# Patient Record
Sex: Male | Born: 1960
Health system: Southern US, Community
[De-identification: ages and names within clinical notes are randomized; demographics above are authoritative.]

## PROBLEM LIST (undated history)

## (undated) DIAGNOSIS — K565 Intestinal adhesions [bands], unspecified as to partial versus complete obstruction: Secondary | ICD-10-CM

## (undated) DIAGNOSIS — K589 Irritable bowel syndrome without diarrhea: Secondary | ICD-10-CM

## (undated) DIAGNOSIS — G8929 Other chronic pain: Secondary | ICD-10-CM

## (undated) HISTORY — DX: Intestinal adhesions (bands), unspecified as to partial versus complete obstruction: K56.50

## (undated) HISTORY — PX: OTHER SURGICAL HISTORY: SHX169

---

## 1999-07-27 ENCOUNTER — Other Ambulatory Visit: Admission: RE | Admit: 1999-07-27 | Discharge: 1999-07-27 | Payer: Self-pay | Admitting: Orthopedic Surgery

## 2004-10-04 HISTORY — PX: ESOPHAGOGASTRODUODENOSCOPY: SHX1529

## 2004-10-04 HISTORY — PX: COLONOSCOPY: SHX174

## 2005-08-09 ENCOUNTER — Encounter: Admission: RE | Admit: 2005-08-09 | Discharge: 2005-08-09 | Payer: Self-pay | Admitting: Gastroenterology

## 2005-12-13 ENCOUNTER — Encounter: Admission: RE | Admit: 2005-12-13 | Discharge: 2005-12-13 | Payer: Self-pay | Admitting: Gastroenterology

## 2005-12-31 ENCOUNTER — Ambulatory Visit (HOSPITAL_COMMUNITY): Admission: RE | Admit: 2005-12-31 | Discharge: 2005-12-31 | Payer: Self-pay

## 2012-07-13 ENCOUNTER — Other Ambulatory Visit: Payer: Self-pay | Admitting: Family Medicine

## 2012-07-13 DIAGNOSIS — IMO0002 Reserved for concepts with insufficient information to code with codable children: Secondary | ICD-10-CM

## 2012-07-18 ENCOUNTER — Ambulatory Visit (HOSPITAL_COMMUNITY): Payer: BC Managed Care – PPO

## 2013-08-26 ENCOUNTER — Inpatient Hospital Stay (HOSPITAL_COMMUNITY)
Admission: EM | Admit: 2013-08-26 | Discharge: 2013-09-04 | DRG: 336 | Disposition: A | Discharge status: Home / Self Care | Payer: BC Managed Care – PPO | Attending: Family Medicine | Admitting: Family Medicine

## 2013-08-26 ENCOUNTER — Encounter (HOSPITAL_COMMUNITY): Payer: Self-pay | Admitting: Emergency Medicine

## 2013-08-26 ENCOUNTER — Emergency Department (HOSPITAL_COMMUNITY): Payer: BC Managed Care – PPO

## 2013-08-26 DIAGNOSIS — K589 Irritable bowel syndrome without diarrhea: Secondary | ICD-10-CM | POA: Diagnosis present

## 2013-08-26 DIAGNOSIS — K565 Intestinal adhesions [bands], unspecified as to partial versus complete obstruction: Principal | ICD-10-CM | POA: Diagnosis present

## 2013-08-26 DIAGNOSIS — R1084 Generalized abdominal pain: Secondary | ICD-10-CM

## 2013-08-26 DIAGNOSIS — IMO0001 Reserved for inherently not codable concepts without codable children: Secondary | ICD-10-CM

## 2013-08-26 DIAGNOSIS — K5289 Other specified noninfective gastroenteritis and colitis: Secondary | ICD-10-CM

## 2013-08-26 DIAGNOSIS — R933 Abnormal findings on diagnostic imaging of other parts of digestive tract: Secondary | ICD-10-CM

## 2013-08-26 DIAGNOSIS — K529 Noninfective gastroenteritis and colitis, unspecified: Secondary | ICD-10-CM

## 2013-08-26 DIAGNOSIS — R030 Elevated blood-pressure reading, without diagnosis of hypertension: Secondary | ICD-10-CM

## 2013-08-26 DIAGNOSIS — K56609 Unspecified intestinal obstruction, unspecified as to partial versus complete obstruction: Secondary | ICD-10-CM

## 2013-08-26 DIAGNOSIS — R109 Unspecified abdominal pain: Secondary | ICD-10-CM

## 2013-08-26 DIAGNOSIS — E871 Hypo-osmolality and hyponatremia: Secondary | ICD-10-CM | POA: Diagnosis present

## 2013-08-26 DIAGNOSIS — F17200 Nicotine dependence, unspecified, uncomplicated: Secondary | ICD-10-CM | POA: Diagnosis present

## 2013-08-26 DIAGNOSIS — N390 Urinary tract infection, site not specified: Secondary | ICD-10-CM | POA: Diagnosis present

## 2013-08-26 HISTORY — DX: Other chronic pain: G89.29

## 2013-08-26 HISTORY — DX: Irritable bowel syndrome, unspecified: K58.9

## 2013-08-26 LAB — COMPREHENSIVE METABOLIC PANEL
ALT: 24 U/L (ref 0–53)
AST: 23 U/L (ref 0–37)
Alkaline Phosphatase: 97 U/L (ref 39–117)
BUN: 12 mg/dL (ref 6–23)
CO2: 23 mEq/L (ref 19–32)
Chloride: 95 mEq/L — ABNORMAL LOW (ref 96–112)
Creatinine, Ser: 1.04 mg/dL (ref 0.50–1.35)
GFR calc Af Amer: >90 mL/min (ref >90)
GFR calc non Af Amer: 82 mL/min — ABNORMAL LOW (ref >90)
Glucose, Bld: 104 mg/dL — ABNORMAL HIGH (ref 70–99)
Potassium: 3.8 mEq/L (ref 3.5–5.1)
Sodium: 133 mEq/L — ABNORMAL LOW (ref 135–145)
Total Bilirubin: 1.1 mg/dL (ref 0.3–1.2)
Total Protein: 7.6 g/dL (ref 6.0–8.3)

## 2013-08-26 LAB — URINALYSIS, ROUTINE W REFLEX MICROSCOPIC
Glucose, UA: NEGATIVE mg/dL
Leukocytes, UA: NEGATIVE
Protein, ur: 30 mg/dL — AB
Specific Gravity, Urine: 1.01 (ref 1.005–1.030)
pH: 5.5 (ref 5.0–8.0)

## 2013-08-26 LAB — CBC WITH DIFFERENTIAL/PLATELET
Basophils Absolute: 0 10*3/uL (ref 0.0–0.1)
Basophils Relative: 0 % (ref 0–1)
Eosinophils Absolute: 0 10*3/uL (ref 0.0–0.7)
Eosinophils Relative: 0 % (ref 0–5)
Hemoglobin: 14.1 g/dL (ref 13.0–17.0)
Lymphocytes Relative: 17 % (ref 12–46)
Lymphs Abs: 1.2 10*3/uL (ref 0.7–4.0)
MCH: 32.3 pg (ref 26.0–34.0)
MCHC: 36.7 g/dL — ABNORMAL HIGH (ref 30.0–36.0)
MCV: 87.9 fL (ref 78.0–100.0)
Monocytes Absolute: 0.6 10*3/uL (ref 0.1–1.0)
Monocytes Relative: 9 % (ref 3–12)
Neutrophils Relative %: 74 % (ref 43–77)
Platelets: 261 10*3/uL (ref 150–400)
RBC: 4.37 MIL/uL (ref 4.22–5.81)
RDW: 11.6 % (ref 11.5–15.5)
WBC: 7 10*3/uL (ref 4.0–10.5)

## 2013-08-26 LAB — URINE MICROSCOPIC-ADD ON

## 2013-08-26 MED ORDER — HYDROMORPHONE HCL PF 1 MG/ML IJ SOLN
1.0000 mg | Freq: Once | INTRAMUSCULAR | Status: AC
  Administered 2013-08-26: 1 mg via INTRAVENOUS
  Filled 2013-08-26: qty 1

## 2013-08-26 MED ORDER — ONDANSETRON HCL 4 MG/2ML IJ SOLN
4.0000 mg | Freq: Four times a day (QID) | INTRAMUSCULAR | Status: DC | PRN
  Administered 2013-08-26 – 2013-08-29 (×3): 4 mg via INTRAVENOUS
  Filled 2013-08-26 (×3): qty 2

## 2013-08-26 MED ORDER — IOHEXOL 300 MG/ML  SOLN
100.0000 mL | Freq: Once | INTRAMUSCULAR | Status: AC | PRN
  Administered 2013-08-26: 100 mL via INTRAVENOUS

## 2013-08-26 MED ORDER — ONDANSETRON HCL 4 MG PO TABS: 4.0000 mg | ORAL_TABLET | Freq: Four times a day (QID) | ORAL | Status: DC | PRN

## 2013-08-26 MED ORDER — ONDANSETRON HCL 4 MG/2ML IJ SOLN
4.0000 mg | Freq: Once | INTRAMUSCULAR | Status: AC
  Administered 2013-08-26: 4 mg via INTRAVENOUS
  Filled 2013-08-26: qty 2

## 2013-08-26 MED ORDER — IOHEXOL 300 MG/ML  SOLN
25.0000 mL | INTRAMUSCULAR | Status: DC | PRN
  Administered 2013-08-26: 25 mL via ORAL

## 2013-08-26 MED ORDER — HYDROMORPHONE HCL PF 1 MG/ML IJ SOLN
0.5000 mg | INTRAMUSCULAR | Status: DC | PRN
  Administered 2013-08-26 – 2013-09-01 (×25): 0.5 mg via INTRAVENOUS
  Filled 2013-08-26 (×25): qty 1

## 2013-08-26 MED ORDER — METRONIDAZOLE IN NACL 5-0.79 MG/ML-% IV SOLN
500.0000 mg | Freq: Once | INTRAVENOUS | Status: AC
  Administered 2013-08-26: 500 mg via INTRAVENOUS
  Filled 2013-08-26: qty 100

## 2013-08-26 MED ORDER — DEXTROSE-NACL 5-0.45 % IV SOLN
INTRAVENOUS | Status: DC
  Administered 2013-08-26 – 2013-08-27 (×2): via INTRAVENOUS
  Administered 2013-08-27 – 2013-08-28 (×3): 125 mL/h via INTRAVENOUS
  Administered 2013-08-29 – 2013-08-31 (×2): via INTRAVENOUS

## 2013-08-26 MED ORDER — HEPARIN SODIUM (PORCINE) 5000 UNIT/ML IJ SOLN
5000.0000 [IU] | Freq: Three times a day (TID) | INTRAMUSCULAR | Status: DC
  Administered 2013-08-26 – 2013-09-01 (×18): 5000 [IU] via SUBCUTANEOUS
  Filled 2013-08-26 (×23): qty 1

## 2013-08-26 NOTE — ED Provider Notes (Signed)
CT Abdomen Pelvis W Contrast (Final result)  Result time: 08/26/13 16:27:57    Final result by Rad Results In Interface (08/26/13 16:27:57)    Narrative:   CLINICAL DATA: Abdominal pain for 4 days with nausea and vomiting  EXAM: CT ABDOMEN AND PELVIS WITH CONTRAST  TECHNIQUE: Multidetector CT imaging of the abdomen and pelvis was performed using the standard protocol following bolus administration of intravenous contrast.  CONTRAST: OMNIPAQUE IOHEXOL 300 MG/ML SOLN  COMPARISON: 12/13/2005  FINDINGS: Visualized portions of the lung bases are clear.  Liver is normal. Gallbladder is normal. Spleen is normal. Pancreas is normal. Adrenal glands are normal. Kidneys are normal except for tiny subcapsular focus of low attenuation midpole left kidney posteriorly likely not of clinical significance. Aorta is not dilated but the show mild calcification. Bladder and reproductive organs are normal.  The colon is decompressed. There is moderate wall thickening of most of the ileum. Transition and small bowel caliber occurs near the junction of the distal jejunum and ileum in the right lower quadrant on image number 42 series 2. This involves all loop of small bowel showing luminal narrowing which could represent stricture. Proximal to this, there is moderate dilatation of jejunum, to about 3.8 cm. More proximal loops of jejunum are not distended at this time. There is minimal free fluid in the right lower quadrant and into the pelvis.  There are no acute musculoskeletal findings.  IMPRESSION: There is evidence of small bowel obstruction with transition near the junction of jejunum and ileum that appears due to stricturing. Most of the ileum shows moderate wall thickening indicative of inflammation. While this could be infectious, the possibility of inflammatory bowel disease should be considered.   Electronically Signed By: Esperanza Heir M.D. On: 08/26/2013 16:27     Medications  iohexol (OMNIPAQUE) 300 MG/ML solution 25 mL (25 mLs Oral Contrast Given 08/26/13 1432)  metroNIDAZOLE (FLAGYL) IVPB 500 mg (500 mg Intravenous New Bag/Given 08/26/13 1833)  ondansetron (ZOFRAN) injection 4 mg (4 mg Intravenous Given 08/26/13 1421)  HYDROmorphone (DILAUDID) injection 1 mg (1 mg Intravenous Given 08/26/13 1540)  iohexol (OMNIPAQUE) 300 MG/ML solution 100 mL (100 mLs Intravenous Contrast Given 08/26/13 1605)  HYDROmorphone (DILAUDID) injection 1 mg (1 mg Intravenous Given 08/26/13 1819)    Patient care assumed from [facility], PA-C.   Patient case discussed with Dr. Andrey Campanile, general surgery, who felt the patient likely did not have a SBO based on history but will be available for consult for the medicine team.   Pain is well managed in ED. Discussed results of CT scan with patient.   Family Medicine will admit the patient. I have reviewed nursing notes, vital signs, and all appropriate lab and imaging results for this patient. IV antibiotics started. The patient appears reasonably stabilized for admission considering the current resources, flow, and capabilities available in the ED at this time, and I doubt any other [facility] requiring further screening and/or treatment in the ED prior to admission.   Patient d/w with Dr. Fayrene Fearing, agrees with plan.    Jeannetta Ellis, PA-C 08/26/13 2001

## 2013-08-26 NOTE — ED Notes (Signed)
Onset 4-5 days ago LLQ and RLQ worsening today intermittently from 0/10 then 10/10 achy sharp with nausea and diaphoresis in am.

## 2013-08-26 NOTE — ED Provider Notes (Signed)
CSN: [id number]     Arrival date & time 08/26/13  1340 History   First MD Initiated Contact with Patient 08/26/13 1341     Chief Complaint  Patient presents with  . Abdominal Pain   (Consider location/radiation/quality/duration/timing/severity/associated sxs/prior Treatment) HPI Comments: Patient with no past surgical history, history of 'nervous stomach' -- presents with complaint of lower abdominal pain for the past 4 days. Symptoms started with watery diarrhea, vomiting, and intermittent cramping pain. He can feel pain in testicles at times but otherwise does not radiate. Patient states that he has had some mild dysuria and urinary frequency. Incontinent of watery stool and urine at times because he can't make it to the bathroom. No skin changes. Patient was seen at outside urgent care and referred to the emergency department for evaluation of appendicitis/abdominal infection. No treatments PTA. The onset of this condition was acute. The course is constant. Aggravating factors: none. Alleviating factors: none. Patient did have colonoscopy about 5 years ago with polyps but does not remember being told that he had diverticulosis.   The history is provided by the patient.    History reviewed. No pertinent past medical history. History reviewed. No pertinent past surgical history. No family history on file. History  Substance Use Topics  . Smoking status: Not on file  . Smokeless tobacco: Not on file  . Alcohol Use: Not on file    Review of Systems  Constitutional: Negative for fever and chills.  HENT: Negative for rhinorrhea and sore throat.   Eyes: Negative for redness.  Respiratory: Negative for cough.   Cardiovascular: Negative for chest pain.  Gastrointestinal: Positive for nausea, vomiting, abdominal pain and diarrhea.  Genitourinary: Positive for dysuria, frequency and testicular pain. Negative for urgency, hematuria, decreased urine volume and discharge.  Musculoskeletal:  Negative for myalgias.  Skin: Negative for rash.  Neurological: Negative for headaches.    Allergies  Review of patient's allergies indicates no known allergies.  Home Medications   Current Outpatient Rx  Name  Route  Sig  Dispense  Refill  . acetaminophen (TYLENOL) 500 MG tablet   Oral   Take 500 mg by mouth every 6 (six) hours as needed.         Marland Kitchen ibuprofen (ADVIL,MOTRIN) 200 MG tablet   Oral   Take 800 mg by mouth every 6 (six) hours as needed.          BP 133/86  Pulse 73  Temp(Src) 97.3 F (36.3 C) (Oral)  Resp 22  SpO2 99% Physical Exam  Nursing note and vitals reviewed. Constitutional: He appears well-developed and well-nourished.  HENT:  Head: Normocephalic and atraumatic.  Eyes: Conjunctivae are normal. Right eye exhibits no discharge. Left eye exhibits no discharge.  Neck: Normal range of motion. Neck supple.  Cardiovascular: Normal rate, regular rhythm and normal heart sounds.   Pulmonary/Chest: Effort normal and breath sounds normal.  Abdominal: Soft. Bowel sounds are normal. There is tenderness (mild to moderate) in the right lower quadrant, suprapubic area and left lower quadrant. There is no rigidity, no rebound, no guarding, no CVA tenderness, no tenderness at McBurney's point and negative Murphy's sign. No hernia.  Neurological: He is alert.  Skin: Skin is warm and dry.  Psychiatric: He has a normal mood and affect.    ED Course  Procedures (including critical care time) Labs Review Labs Reviewed  CBC WITH DIFFERENTIAL - Abnormal; Notable for the following:    HCT 38.4 (*)    MCHC 36.7 (*)  All other components within normal limits  COMPREHENSIVE METABOLIC PANEL - Abnormal; Notable for the following:    Sodium 133 (*)    Chloride 95 (*)    Glucose, Bld 104 (*)    GFR calc non Af Amer 82 (*)    All other components within normal limits  URINALYSIS, ROUTINE W REFLEX MICROSCOPIC   Imaging Review No results found.  EKG Interpretation    None      1:52 PM Patient seen and examined. Work-up initiated. Medications ordered. Will need CT to r/o appendicitis, diverticulitis.   Vital signs reviewed and are as follows: Filed Vitals:   08/26/13 1340  BP: 133/86  Pulse: 73  Temp: 97.3 F (36.3 C)  Resp: 22   3:32 PM Handoff to Pie[place]brink PA-C who will follow-up on results.   MDM   1. Abdominal pain    Pending eval for abd pain.     Renne Crigler, PA-C 08/26/13 1536

## 2013-08-26 NOTE — ED Notes (Signed)
Pt returns from ct scan. 

## 2013-08-26 NOTE — H&P (Signed)
Family Medicine Teaching [facility] Admission History and Physical Service Pager: [phone]  Patient name: Garrett Lin Medical record number: [id number] Date of birth: Feb 17, 1961 Age: 52 y.o. Gender: male  Primary Care Provider: Pcp Not In System Consultants: None Code Status: Full  Chief Complaint: Abdominal Pain  Assessment and Plan: RENNER SEBALD is a 52 y.o. male presenting with abdominal pain . PMH is significant for IBS.   # SBO - CT shows evidence of small bowel obstruction with transition near the junction of jejunum and ileum that appears due to stricturing. No Hx of abdominal surgery. No hernia present on exam. Possible due to IBD see abdominal pain below.  - NPO; No NG tube currently will consider if Vomiting/nausea/distension occurs - Zofran for nausea - Dilaudid for pain control - IVF: D5 1/2 NS @ 125 - CBC and BMET in am - per ED report, gen surg was consulted and stated nothing to do surgically at this time  # Abdominal Pain - Diffuse abdominal pain greatest in LLQ. Pt is Afebrile and VSS. Denies FHx of IBD. Reports Colonoscopy in his 43s w/ Polyps - DDx: IBS vs IBD vs Cdiff (Abx ~ 87mo ago for sinus infection) vs Gastroenteritis  - Given Flagyl in ED; will hold off on additional abx at this time as patient is without elevated WBC or fever to indicate an infectious process - Pain and Nausea control as above - C diff ordered - CRP: Pending - UA and FOBT ordered - Consider GI consult; may need colonoscopy to evaluate for IBD  # elevated BP: may be related to pain vs patient with HTN - will continue to monitor and consider addition of anti-HTN medication if continues to be elevated  FEN/GI: D5 1/2 NS @ 125 Prophylaxis: Heparin  Disposition: Admit to floor; Attending [place]; Discharge home pending improved PO intake and Pain control  History of Present Illness: Garrett Lin is a 52 y.o. male presenting with diffuse abdominal pain greatest in the  LLQ that began Tuesday that has been getting gradually worse, and associated with N/V/D. Denies current blood in vomit or stool, but hx of rectal bleeding due to hemorrhoids.  Denies any abdominal surgery, but had colonoscopy in his 42 that showed polyps. He endorses a long history of heart burn and diarrhea. Denies any fevers, recent abx use, recent travel, alcohol use, fhx of IBD, or hernias. He notes his sister was recently diagnosed with C diff. He notes he has not seen a regular PCP in some time.  Review Of Systems: Per HPI with the following additions:  Otherwise 12 point review of systems was performed and was unremarkable.  There are no active problems to display for this patient.  Past Medical History: History reviewed. No pertinent past medical history. Past Surgical History: History reviewed. No pertinent past surgical history. Social History: History  Substance Use Topics  . Smoking status: Not on file  . Smokeless tobacco: Not on file  . Alcohol Use: Not on file   Additional social history:   Please also refer to relevant sections of EMR.  Family History: No family history on file. Allergies and Medications: No Known Allergies No current facility-administered medications on file prior to encounter.   No current outpatient prescriptions on file prior to encounter.    Objective: BP 149/87  Pulse 93  Temp(Src) 98.2 F (36.8 C) (Oral)  Resp 20  SpO2 97% Exam: Gen: WD/WN in NAD HEENT: MM dry; PERRLA CV: RRR, No m/r/g; No  LE Edema Lungs: CTAB; Normal Resp. Effort Abdomen: Mild suprapubic tenderness; No rebounding or guarding, non-distended  Labs and Imaging: CBC BMET   Recent Labs Lab 08/26/13 1355  WBC 7.0  HGB 14.1  HCT 38.4*  PLT 261    Recent Labs Lab 08/26/13 1355  NA 133*  K 3.8  CL 95*  CO2 23  BUN 12  CREATININE 1.04  GLUCOSE 104*  CALCIUM 9.4     CT Abdomen IMPRESSION:  There is evidence of small bowel obstruction with transition  near  the junction of jejunum and ileum that appears due to stricturing.  Most of the ileum shows moderate wall thickening indicative of  inflammation. While this could be infectious, the possibility of  inflammatory bowel disease should be considered.   Wenda Low, MD 08/26/2013, 7:32 PM PGY-1, [facility] FPTS Intern pager: [phone], text pages welcome  Upper Level Addendum:  I have seen and evaluated this patient along with Dr. Gayla Doss and reviewed the above note, making necessary revisions in red.   Marikay Alar, MD Family Medicine PGY-2

## 2013-08-27 ENCOUNTER — Encounter (HOSPITAL_COMMUNITY): Payer: Self-pay | Admitting: *Deleted

## 2013-08-27 DIAGNOSIS — R1084 Generalized abdominal pain: Secondary | ICD-10-CM

## 2013-08-27 DIAGNOSIS — N390 Urinary tract infection, site not specified: Secondary | ICD-10-CM

## 2013-08-27 DIAGNOSIS — K56609 Unspecified intestinal obstruction, unspecified as to partial versus complete obstruction: Secondary | ICD-10-CM

## 2013-08-27 DIAGNOSIS — R933 Abnormal findings on diagnostic imaging of other parts of digestive tract: Secondary | ICD-10-CM

## 2013-08-27 LAB — CBC
HCT: 45.1 % (ref 39.0–52.0)
Hemoglobin: 16.4 g/dL (ref 13.0–17.0)
MCV: 90.4 fL (ref 78.0–100.0)
Platelets: 272 10*3/uL (ref 150–400)
RBC: 4.99 MIL/uL (ref 4.22–5.81)
RDW: 11.8 % (ref 11.5–15.5)
WBC: 6.3 10*3/uL (ref 4.0–10.5)

## 2013-08-27 LAB — BASIC METABOLIC PANEL
CO2: 27 mEq/L (ref 19–32)
Calcium: 8.9 mg/dL (ref 8.4–10.5)
Chloride: 97 mEq/L (ref 96–112)
Creatinine, Ser: 1.09 mg/dL (ref 0.50–1.35)
Potassium: 3.6 mEq/L (ref 3.5–5.1)
Sodium: 134 mEq/L — ABNORMAL LOW (ref 135–145)

## 2013-08-27 LAB — SEDIMENTATION RATE: Sed Rate: 5 mm/hr (ref 0–16)

## 2013-08-27 LAB — C-REACTIVE PROTEIN: CRP: 2.7 mg/dL — ABNORMAL HIGH (ref <0.60)

## 2013-08-27 MED ORDER — CIPROFLOXACIN IN D5W 200 MG/100ML IV SOLN
200.0000 mg | Freq: Two times a day (BID) | INTRAVENOUS | Status: DC
  Administered 2013-08-27: 200 mg via INTRAVENOUS
  Filled 2013-08-27 (×2): qty 100

## 2013-08-27 MED ORDER — PANTOPRAZOLE SODIUM 40 MG IV SOLR
40.0000 mg | INTRAVENOUS | Status: DC
  Administered 2013-08-27 – 2013-08-28 (×2): 40 mg via INTRAVENOUS
  Filled 2013-08-27 (×3): qty 40

## 2013-08-27 MED ORDER — CIPROFLOXACIN IN D5W 400 MG/200ML IV SOLN
400.0000 mg | Freq: Two times a day (BID) | INTRAVENOUS | Status: DC
  Administered 2013-08-27 – 2013-08-29 (×4): 400 mg via INTRAVENOUS
  Filled 2013-08-27 (×5): qty 200

## 2013-08-27 NOTE — Progress Notes (Signed)
Family Medicine Teaching Service Daily Progress Note Intern Pager: [phone]  Patient name: Garrett Lin Medical record number: [id number] Date of birth: 1960-12-29 Age: 52 y.o. Gender: male  Primary Care Provider: Pcp Not In System Consultants: None Code Status: Full  Pt Overview and Major Events to Date:  11/23: CT concerning for SBO and ileitis; VSS; NPO;  D5 1/2 NS @ 125   Assessment and Plan: Garrett Lin is a 52 y.o. male presenting with abdominal pain . PMH is significant for IBS.   # SBO  - CT shows evidence of small bowel obstruction with transition near the junction of jejunum and ileum that appears due to stricturing. No Hx of abdominal surgery. No hernia present on exam. Possible due to IBD see abdominal pain below.  - per ED report, gen surg was consulted and stated nothing to do surgically at this time  - NPO; No NG tube currently will consider if Vomiting/nausea/distension occurs  - Zofran for nausea  - Dilaudid for pain control  - IVF: D5 1/2 NS @ 125  - CBC wnl & BMET (11/24): Na 134, Gluc 111, GFR 77 < 88 (on admit)  # Abdominal Pain  - Diffuse abdominal pain greatest in LLQ. Pt is Afebrile and VSS. Denies FHx of IBD. Reports Colonoscopy in his 83s w/ Polyps  - DDx: IBS vs IBD vs Cdiff (Abx ~ 15mo ago for sinus infection) vs Gastroenteritis  - Given Flagyl in ED; will hold off on additional abx at this time as patient is without elevated WBC or fever to indicate an infectious process  - Pain and Nausea control as above  - C diff & FOBT: ordered  - CRP: Pending  - Consider GI consult; may need colonoscopy to evaluate for IBD   # UTI - UA: Positive for Nitrites, Leuks (-), Small Hgb, Bili, and rare Bacteria - Afeb, No CVA tenderness - Cipro 400 BID:  day 1; to cover possible IBD  # elevated BP: may be related to pain vs patient with HTN  - BP last 24hrs: (131-160)/(77-90) 140/77 mmHg  - will continue to monitor and consider addition of anti-HTN medication  if continues to be elevated   FEN/GI: D5 1/2 NS @ 125  Prophylaxis: Heparin   Disposition: Attending [place]; Discharge home pending improved PO intake and Pain control  Subjective: Still endorses "heart burn" with some nausea, but no vomiting since admission. Wants to hold off on NG tube at this time. No BM or passing gas since admission.   Objective: Temp:  [97.3 F (36.3 C)-98.2 F (36.8 C)] 97.7 F (36.5 C) (11/24 0549) Pulse Rate:  [73-94] 74 (11/24 0549) Resp:  [16-22] 20 (11/24 0549) BP: (131-160)/(77-90) 140/77 mmHg (11/24 0549) SpO2:  [94 %-99 %] 98 % (11/24 0549) Weight:  [149 lb 11.2 oz (67.903 kg)] 149 lb 11.2 oz (67.903 kg) (11/23 2009) Physical Exam: Gen: WD/WN in NAD  HEENT: MMM; PERRLA  CV: RRR, No m/r/g; No LE Edema  Lungs: CTAB; Normal Resp. Effort  Abdomen: Mild suprapubic tenderness; No rebounding or guarding, non-distended  Laboratory:  Recent Labs Lab 08/26/13 1355 08/27/13 0315  WBC 7.0 6.3  HGB 14.1 16.4  HCT 38.4* 45.1  PLT 261 272    Recent Labs Lab 08/26/13 1355 08/27/13 0315  NA 133* 134*  K 3.8 3.6  CL 95* 97  CO2 23 27  BUN 12 15  CREATININE 1.04 1.09  CALCIUM 9.4 8.9  PROT 7.6  --   BILITOT  1.1  --   ALKPHOS 97  --   ALT 24  --   AST 23  --   GLUCOSE 104* 111*   Imaging/Diagnostic Tests: CT Abdomen  IMPRESSION:  There is evidence of small bowel obstruction with transition near  the junction of jejunum and ileum that appears due to stricturing.  Most of the ileum shows moderate wall thickening indicative of  inflammation. While this could be infectious, the possibility of  inflammatory bowel disease should be considered.  Wenda Low, MD 08/27/2013, 6:34 AM PGY-1, [place] FPTS Intern pager: [phone], text pages welcome

## 2013-08-27 NOTE — Consult Note (Signed)
Reason for Consult:CT scan finding of "partial small bowel obstruction" Referring Physician: Dr. Randolm Idol - FPTS  MASAKI ROTHBAUER is an 52 y.o. male.  HPI: This is a 52 yo male with a history of irritable bowel syndrome and "nervous stomach" that presents with one week of generalized abdominal distention, diarrhea, and heartburn.  Denies any hematochezia, melena.  He began vomiting over the weekend.  His last large BM was 08/26/13 and was watery diarrhea.  The pain worsened, so he came to the ED on 11/23 and was admitted by FPTS.  CT scan 11/23 showed thickening of the ileum with possible PSBO.  GI - Russella Dar - has seen the patient.  No follow-up films have been performed yet.  The patient reports a small bowel movement and flatus today.  His abdomen feels distended.  Past Medical History  Diagnosis Date  . Irritable bowel syndrome (IBS) 2006, 2007    "nervous stomach" abdominal pain  . Chronic pain     right forearm.    Past Surgical History  Procedure Laterality Date  . Colonoscopy  2006    Carman Ching MD  . Esophagogastroduodenoscopy  2006    Raymondo Band MD  . Surgery for right forarm trauma      work related machine injury    Family History  Problem Relation Age of Onset  .       Social History:  reports that he has been smoking Cigarettes.  He has a 15 pack-year smoking history. He does not have any smokeless tobacco history on file. His alcohol and drug histories are not on file.  Allergies: No Known Allergies  Medications:  Prior to Admission medications   Medication Sig Start Date End Date Taking? Authorizing Provider  acetaminophen (TYLENOL) 500 MG tablet Take 500 mg by mouth every 6 (six) hours as needed.   Yes Historical Provider, MD  ibuprofen (ADVIL,MOTRIN) 200 MG tablet Take 800 mg by mouth every 6 (six) hours as needed.   Yes Historical Provider, MD     Results for orders placed during the hospital encounter of 08/26/13 (from the past 48 hour(s))  CBC WITH  DIFFERENTIAL     Status: Abnormal   Collection Time    08/26/13  1:55 PM      Result Value Range   WBC 7.0  4.0 - 10.5 K/uL   RBC 4.37  4.22 - 5.81 MIL/uL   Hemoglobin 14.1  13.0 - 17.0 g/dL   HCT 16.1 (*) 09.6 - 04.5 %   MCV 87.9  78.0 - 100.0 fL   MCH 32.3  26.0 - 34.0 pg   MCHC 36.7 (*) 30.0 - 36.0 g/dL   RDW 40.9  81.1 - 91.4 %   Platelets 261  150 - 400 K/uL   Neutrophils Relative % 74  43 - 77 %   Neutro Abs 5.2  1.7 - 7.7 K/uL   Lymphocytes Relative 17  12 - 46 %   Lymphs Abs 1.2  0.7 - 4.0 K/uL   Monocytes Relative 9  3 - 12 %   Monocytes Absolute 0.6  0.1 - 1.0 K/uL   Eosinophils Relative 0  0 - 5 %   Eosinophils Absolute 0.0  0.0 - 0.7 K/uL   Basophils Relative 0  0 - 1 %   Basophils Absolute 0.0  0.0 - 0.1 K/uL  COMPREHENSIVE METABOLIC PANEL     Status: Abnormal   Collection Time    08/26/13  1:55 PM  Result Value Range   Sodium 133 (*) 135 - 145 mEq/L   Potassium 3.8  3.5 - 5.1 mEq/L   Chloride 95 (*) 96 - 112 mEq/L   CO2 23  19 - 32 mEq/L   Glucose, Bld 104 (*) 70 - 99 mg/dL   BUN 12  6 - 23 mg/dL   Creatinine, Ser 0.27  0.50 - 1.35 mg/dL   Calcium 9.4  8.4 - 25.3 mg/dL   Total Protein 7.6  6.0 - 8.3 g/dL   Albumin 4.2  3.5 - 5.2 g/dL   AST 23  0 - 37 U/L   Comment: HEMOLYSIS AT THIS LEVEL MAY AFFECT RESULT   ALT 24  0 - 53 U/L   Alkaline Phosphatase 97  39 - 117 U/L   Total Bilirubin 1.1  0.3 - 1.2 mg/dL   GFR calc non Af Amer 82 (*) >90 mL/min   GFR calc Af Amer >90  >90 mL/min   Comment: (NOTE)     The eGFR has been calculated using the CKD EPI equation.     This calculation has not been validated in all clinical situations.     eGFR's persistently <90 mL/min signify possible Chronic Kidney     Disease.  URINALYSIS, ROUTINE W REFLEX MICROSCOPIC     Status: Abnormal   Collection Time    08/26/13 10:00 PM      Result Value Range   Color, Urine AMBER (*) YELLOW   Comment: BIOCHEMICALS MAY BE AFFECTED BY COLOR   APPearance CLEAR  CLEAR    Specific Gravity, Urine 1.010  1.005 - 1.030   pH 5.5  5.0 - 8.0   Glucose, UA NEGATIVE  NEGATIVE mg/dL   Hgb urine dipstick SMALL (*) NEGATIVE   Bilirubin Urine SMALL (*) NEGATIVE   Ketones, ur 40 (*) NEGATIVE mg/dL   Protein, ur 30 (*) NEGATIVE mg/dL   Urobilinogen, UA 1.0  0.0 - 1.0 mg/dL   Nitrite POSITIVE (*) NEGATIVE   Leukocytes, UA NEGATIVE  NEGATIVE  URINE MICROSCOPIC-ADD ON     Status: None   Collection Time    08/26/13 10:00 PM      Result Value Range   Squamous Epithelial / LPF RARE  RARE   RBC / HPF 3-6  <3 RBC/hpf   Bacteria, UA RARE  RARE  BASIC METABOLIC PANEL     Status: Abnormal   Collection Time    08/27/13  3:15 AM      Result Value Range   Sodium 134 (*) 135 - 145 mEq/L   Potassium 3.6  3.5 - 5.1 mEq/L   Chloride 97  96 - 112 mEq/L   CO2 27  19 - 32 mEq/L   Glucose, Bld 111 (*) 70 - 99 mg/dL   BUN 15  6 - 23 mg/dL   Creatinine, Ser 6.64  0.50 - 1.35 mg/dL   Calcium 8.9  8.4 - 40.3 mg/dL   GFR calc non Af Amer 77 (*) >90 mL/min   GFR calc Af Amer 90 (*) >90 mL/min   Comment: (NOTE)     The eGFR has been calculated using the CKD EPI equation.     This calculation has not been validated in all clinical situations.     eGFR's persistently <90 mL/min signify possible Chronic Kidney     Disease.  CBC     Status: Abnormal   Collection Time    08/27/13  3:15 AM      Result Value Range  WBC 6.3  4.0 - 10.5 K/uL   RBC 4.99  4.22 - 5.81 MIL/uL   Hemoglobin 16.4  13.0 - 17.0 g/dL   HCT 11.9  14.7 - 82.9 %   MCV 90.4  78.0 - 100.0 fL   MCH 32.9  26.0 - 34.0 pg   MCHC 36.4 (*) 30.0 - 36.0 g/dL   RDW 56.2  13.0 - 86.5 %   Platelets 272  150 - 400 K/uL  C-REACTIVE PROTEIN     Status: Abnormal   Collection Time    08/27/13  3:15 AM      Result Value Range   CRP 2.7 (*) <0.60 mg/dL   Comment: Performed at [facility]  SEDIMENTATION RATE     Status: None   Collection Time    08/27/13  2:00 PM      Result Value Range   Sed Rate 5  0 - 16 mm/hr     Ct Abdomen Pelvis W Contrast  08/26/2013   CLINICAL DATA:  Abdominal pain for 4 days with nausea and vomiting  EXAM: CT ABDOMEN AND PELVIS WITH CONTRAST  TECHNIQUE: Multidetector CT imaging of the abdomen and pelvis was performed using the standard protocol following bolus administration of intravenous contrast.  CONTRAST:  OMNIPAQUE IOHEXOL 300 MG/ML  SOLN  COMPARISON:  12/13/2005  FINDINGS: Visualized portions of the lung bases are clear.  Liver is normal. Gallbladder is normal. Spleen is normal. Pancreas is normal. Adrenal glands are normal. Kidneys are normal except for tiny subcapsular focus of low attenuation midpole left kidney posteriorly likely not of clinical significance. Aorta is not dilated but the show mild calcification. Bladder and reproductive organs are normal.  The colon is decompressed. There is moderate wall thickening of most of the ileum. Transition and small bowel caliber occurs near the junction of the distal jejunum and ileum in the right lower quadrant on image number 42 series 2. This involves all loop of small bowel showing luminal narrowing which could represent stricture. Proximal to this, there is moderate dilatation of jejunum, to about 3.8 cm. More proximal loops of jejunum are not distended at this time. There is minimal free fluid in the right lower quadrant and into the pelvis.  There are no acute musculoskeletal findings.  IMPRESSION: There is evidence of small bowel obstruction with transition near the junction of jejunum and ileum that appears due to stricturing. Most of the ileum shows moderate wall thickening indicative of inflammation. While this could be infectious, the possibility of inflammatory bowel disease should be considered.   Electronically Signed   By: Esperanza Heir M.D.   On: 08/26/2013 16:27    ROS Blood pressure 140/77, pulse 74, temperature 97.7 F (36.5 C), temperature source Oral, resp. rate 20, height 6' (1.829 m), weight 149 lb 11.2  oz (67.903 kg), SpO2 98.00%. Physical Exam WDWN in NAD HEENT:  EOMI, sclera anicteric Neck:  No masses, no thyromegaly Lungs:  CTA bilaterally; normal respiratory effort CV:  Regular rate and rhythm; no murmurs Abd:  +bowel sounds, soft, mildly distended; migrating tenderness - now more tender in RLQ Ext:  Well-perfused; no edema Skin:  Warm, dry; no sign of jaundice  Assessment/Plan: Clinically, he does not appear to have a complete obstruction.  There is wall thickening of the ileum, with some dilation of the proximal SB (jejunum).  He has had no previous abdominal surgery.  This more likely represents some type of infectious/ inflammatory process, as opposed to a mechanical  obstruction.    Recommend follow-up films to see if the oral contrast from 11/23 has made it to the colon.  Also, consider gastrografin small bowel follow-through vs. CT enterography.   No obvious surgical indications at this time.   We will follow with you.    Lendy Dittrich K. 08/27/2013, 6:45 PM

## 2013-08-27 NOTE — H&P (Signed)
Family Medicine Teaching Service Attending Note  I interviewed and examined patient Garrett Lin and reviewed their tests and x-rays.  I discussed with Dr. Birdie Sons and reviewed their note for today.  I agree with their assessment and plan.     Additionally  No vomting this AM.  Pain is better No PMH of GI problems or arthritis or skin changes No exposure to gastroenteritis or travel etc  Inflammation of intestine on CT - infection vs inflamation Bowel rest Consult GI for further work up Check ESR

## 2013-08-27 NOTE — Discharge Summary (Signed)
Family Medicine Teaching [facility] Discharge Summary  Patient name: Garrett Lin Medical record number: [id number] Date of birth: 1961-05-30 Age: 52 y.o. Gender: male Date of Admission: 08/26/2013  Date of Discharge: 09/04/13 Admitting Physician: Carney Living, MD  Primary Care Provider: Pcp Not In System Consultants: Gen surg, GI  Indication for Hospitalization: Abdominal pain,SBO (by CT read)  Discharge Diagnoses/Problem List:  -SBO -abdominal pain -right calf pain  Disposition: home  Discharge Condition: improved  Discharge Exam:  BP 133/83  Pulse 67  Temp(Src) 97.6 F (36.4 C) (Oral)  Resp 20  Ht 6' (1.829 m)  Wt 149 lb 11.2 oz (67.903 kg)  BMI 20.30 kg/m2  SpO2 100% Gen: resting comfortably. No acute distress.  CV: RRR, No m/r/g  Lungs: CTAB; Normal Resp. Effort  Abdomen: Non-distended, normal bowel sounds, tender over port sites  Extremities: No Edema, no echymosis, no erythema  Brief Hospital Course: Garrett Lin is a 52 y.o. Male who presented with partial SBO and UTI. PMH is significant for IBS. Initial CT showed evidence of small bowel obstruction with transition near the junction of jejunum and ileum that appears due to stricturing, and most of the ileum showed moderate wall thickening indicative of inflammation concerning for IBD. He was made NPO and Gastroenterology was consulted. His UTI was treated with Cipro, and Flagyl was started to cover possible IBD. A CT Entero was obtained which show persistent SBO, but resolution of distal small bowel wall thickening. Surgery was consulted due to inability to advance diet.   SBO: After surg pre-op discussion with fam, pt scheduled for a diagnostic lap on 11/30. Underwent lap with no complications and findings of chronic adhesions of right colon s/p removal. No evidence of Crohn's strictures or tumors. Pt without hx of intraabdominal surg. Unclear etiology? Will need to f/up closely with GI for  daignostic colonoscopy. [facility] made aware. Pt with minimal pain, ambulating well, passing flatus and s/p BM prior to d/c.  UTI: UA was positive for Nitrites, but no culture was obtained. Patient was covered by Cipro 400 mg BID and Flagyl for possible IBD treatment for 2 days, and left on Cipro 250 mg BID to complete 5 day course.  Right calf pain/tenderness: Pt initially c/o some crampy pain in right pop fossa prior to lap. Was on heparin during admission for VTE prophylaxis. Venous dopplers neg for DVT. Pt with early ambulation after surg. Resolved prior to d/c.  Issues for Follow Up:  1. Diagnostic colonoscopy with GI  Significant Procedures: Diagnostic Lap "The only abnormal finding really was some small bowel distention without a specific obstructing point and chronic adhesions to the right abdominal wall from the right colon of unknown etiology." (see op note for further details)  Significant Labs and Imaging:   Recent Labs Lab 09/02/13 0745 09/03/13 0606 09/04/13 0640  WBC 5.5 9.4 8.0  HGB 13.6 15.5 15.4  HCT 39.7 43.9 44.1  PLT 255 341 327    Recent Labs Lab 08/31/13 0520 09/01/13 0355 09/02/13 0745 09/03/13 0606 09/04/13 0640  NA 135 134* 135 139 136  K 3.9 3.5 4.6 4.2 4.5  CL 102 99 102 103 98  CO2 25 25 27 28 28   GLUCOSE 83 103* 101* 89 93  BUN 9 7 6 6 8   CREATININE 1.00 0.98 1.01 1.06 1.01  CALCIUM 8.6 8.6 8.9 9.1 9.5   Urinalysis    Component  Value  Date/Time    COLORURINE  AMBER*  08/30/2013 1214  APPEARANCEUR  CLEAR  08/30/2013 1214    LABSPEC  1.023  08/30/2013 1214    PHURINE  7.5  08/30/2013 1214    GLUCOSEU  NEGATIVE  08/30/2013 1214    HGBUR  NEGATIVE  08/30/2013 1214    BILIRUBINUR  NEGATIVE  08/30/2013 1214    KETONESUR  15*  08/30/2013 1214    PROTEINUR  NEGATIVE  08/30/2013 1214    UROBILINOGEN  1.0  08/30/2013 1214    NITRITE  NEGATIVE  08/30/2013 1214    LEUKOCYTESUR  NEGATIVE  08/30/2013 1214     Results/Tests Pending at Time of  Discharge: None  Discharge Medications:    Medication List         acetaminophen 500 MG tablet  Commonly known as:  TYLENOL  Take 500 mg by mouth every 6 (six) hours as needed.     DSS 100 MG Caps  Take 100 mg by mouth 2 (two) times daily.     ibuprofen 200 MG tablet  Commonly known as:  ADVIL,MOTRIN  Take 800 mg by mouth every 6 (six) hours as needed.     oxyCODONE-acetaminophen 5-325 MG per tablet  Commonly known as:  PERCOCET/ROXICET  Take 1-2 tablets by mouth every 4 (four) hours as needed for moderate pain.     pantoprazole 20 MG tablet  Commonly known as:  PROTONIX  Take 1 tablet (20 mg total) by mouth 2 (two) times daily.        Discharge Instructions: Please refer to Patient Instructions section of EMR for full details.  Patient was counseled important signs and symptoms that should prompt return to medical care, changes in medications, dietary instructions, activity restrictions, and follow up appointments.   Follow-Up Appointments: Follow-up Information   Schedule an appointment as soon as possible for a visit with [facility]. (for hospital f/up in the next week)    Contact information:   [address] [place] [address] [phone]      Follow up with Currie Paris, MD On 09/21/2013. (arrive by 8:15AM to check in for your appointment with the surgeon)    Specialty:  General Surgery   Contact information:   [address] [place] [address] [phone]       Anselm Lis, MD 09/04/2013, 2:00 PM PGY-1, [facility]

## 2013-08-27 NOTE — Consult Note (Signed)
Gastroenterology Consult: 1:58 PM 08/27/2013  LOS: 1 day    Referring Provider: Dr Randolm Idol, teaching service attending  Primary Care Physician:  Ignacia Bayley FP Primary Gastroenterologist:  Dr. Randa Evens 2006-2007    Reason for Consultation:  Abnormal SB on CT scan.   HPI: Garrett Lin is a 52 y.o. male.  Hx of "nervous stomach", basically abdominal pain and some change in bowel habitsm, in 2006 at which time he had EGD and colonoscopy by Dr Randa Evens.  Per pt nothing was found.  Had CT scan, ultrasound, gastric emptying scan in 2006 - 2007. These are in Epic and there was ? Thickened vs underdistended rectum on the CT    Now admitted with GI sxs started Wednesday 11/18 with heartburn after eating canned "potted meat", worse after pizza and worse after eating stew later in day. By Thursday having lots of belching and loose, watery and not bloody stools. Nausea with brown emesis over the weekend.  Sxs persited, no response to Tums, OTC Prilosec, Ibuprofen 800.  Generalized pain very intense early AM Sunday so came to ED.    Labs here: Normal CBC, normal CMET except for NA of 133.  CT scan with contrast with SBO near jejuno-ileal junction, looks to have stricturing in the area. Ileum is generally thickened.  Not able to say if findings due to infection or Crohn's disease. No plain abdominal x rays yet.  Has not been seen by surgeon.  No sick contacts who ate same food except for the canned meat.   Generally has not been having GI issues except gets diarrhea if he eats lot of fatty food. Infrequent heartburn, no significant intestinal distress. In last 4 weeks has used 6 aleve, 16 of OTC Ibuprofen and one Goodie's for control of right arm pain and headaches.     Past Medical History  Diagnosis Date  . Irritable  bowel syndrome (IBS) 2006, 2007    "nervous stomach" abdominal pain  . Chronic pain     right forearm.    Past Surgical History  Procedure Laterality Date  . Colonoscopy  2006    Carman Ching MD  . Esophagogastroduodenoscopy  2006    Raymondo Band MD  . Surgery for right forarm trauma      work related machine injury    Prior to Admission medications   Medication Sig Start Date End Date Taking? Authorizing Provider  acetaminophen (TYLENOL) 500 MG tablet Take 500 mg by mouth every 6 (six) hours as needed.   Yes Historical Provider, MD  ibuprofen (ADVIL,MOTRIN) 200 MG tablet Take 800 mg by mouth every 6 (six) hours as needed.   Yes Historical Provider, MD    Scheduled Meds: . ciprofloxacin  400 mg Intravenous Q12H  . heparin  5,000 Units Subcutaneous Q8H  . pantoprazole (PROTONIX) IV  40 mg Intravenous Q24H   Infusions: . dextrose 5 % and 0.45% NaCl 125 mL/hr at 08/27/13 1324   PRN Meds: HYDROmorphone (DILAUDID) injection, ondansetron (ZOFRAN) IV, ondansetron   Allergies as of 08/26/2013  . (No Known  Allergies)    Family History  Problem Relation Age of Onset  . Clostridium diff colitis  sister adulthood    History   Social History  . Marital Status: Married    Spouse Name: N/A    Number of Children: N/A  . Years of Education: N/A   Occupational History  . Works on [employer].    Social History Main Topics  . Smoking status: Current Every Day Smoker -- 2.00 packs/day for >15 years    Types: Cigarettes  . Smokeless tobacco: No  . Alcohol Use: None  . Drug Use: No  . Sexual Activity: yes    REVIEW OF SYSTEMS: Constitutional:  Weight stable ENT:  No nose bleeds Pulm:  No SOB, + clear productive cough CV:  No chest pain, no pedal edema, no palpitation GU:  No hematuria or dysuria, no weak stream or frequency GI:  Per HPI  Heme:  No hx anemia or unusual bleeding or bruising.    Transfusions:  none Neuro:  Intermittent headaches MS:   Pain in right forarm Derm:  No rash or itching Endocrine:  No sweats, no polyuria, no polydipsia  Immunization:  Flu shot10/2014 Travel:  None.    PHYSICAL EXAM: Vital signs in last 24 hours: Filed Vitals:   08/27/13 0549  BP: 140/77  Pulse: 74  Temp: 97.7 F (36.5 C)  Resp: 20   Wt Readings from Last 3 Encounters:  08/26/13 67.903 kg (149 lb 11.2 oz)   General: thin WM in NAD Head: no swelling or trauma   Eyes:  No icterus or pallor Ears:  Not HOH  Nose:  No discharge Mouth:  Clear and moist oral mm Neck:  No mass or JVD Lungs:  Some ronchi, no dyspnea, raspy smokers voice Heart: RRR.  No MRG Abdomen:  Soft, not distended, active BS, tender on left abdomen.   Rectal: deferred Musc/Skeltl: no joint swelling.  Post surgical changes in right forarm Extremities:  No pedal edema.  Feet warm  Neurologic:  Oriented x 3.  No tremor, no limb weakness. Skin:  No sores or rashes Tattoos:  None seen Nodes:  No cervical or inguinal adenopathy   Psych:  Pleasant , alert and cooperative.   Intake/Output from previous day: 11/23 0701 - 11/24 0700 In: 1035 [I.V.:1035] Out: 350 [Urine:350] Intake/Output this shift:    LAB RESULTS:  Recent Labs  08/26/13 1355 08/27/13 0315  WBC 7.0 6.3  HGB 14.1 16.4  HCT 38.4* 45.1  PLT 261 272   BMET Lab Results  Component Value Date   NA 134* 08/27/2013   NA 133* 08/26/2013   K 3.6 08/27/2013   K 3.8 08/26/2013   CL 97 08/27/2013   CL 95* 08/26/2013   CO2 27 08/27/2013   CO2 23 08/26/2013   GLUCOSE 111* 08/27/2013   GLUCOSE 104* 08/26/2013   BUN 15 08/27/2013   BUN 12 08/26/2013   CREATININE 1.09 08/27/2013   CREATININE 1.04 08/26/2013   CALCIUM 8.9 08/27/2013   CALCIUM 9.4 08/26/2013   LFT  Recent Labs  08/26/13 1355  PROT 7.6  ALBUMIN 4.2  AST 23  ALT 24  ALKPHOS 97  BILITOT 1.1   PT/INR No results found for this basename: INR,  PROTIME   Hepatitis Panel No results found for this basename: HEPBSAG,  HCVAB, HEPAIGM, HEPBIGM,  in the last 72 hours C-Diff No components found with this basename: cdiff   Lipase  No results found for this  basename: lipase     Drugs of Abuse  No results found for this basename: labopia,  cocainscrnur,  labbenz,  amphetmu,  thcu,  labbarb     RADIOLOGY STUDIES: Ct Abdomen Pelvis W Contrast  08/26/2013   COMPARISON:  12/13/2005  FINDINGS: Visualized portions of the lung bases are clear.  Liver is normal. Gallbladder is normal. Spleen is normal. Pancreas is normal. Adrenal glands are normal. Kidneys are normal except for tiny subcapsular focus of low attenuation midpole left kidney posteriorly likely not of clinical significance. Aorta is not dilated but the show mild calcification. Bladder and reproductive organs are normal.  The colon is decompressed. There is moderate wall thickening of most of the ileum. Transition and small bowel caliber occurs near the junction of the distal jejunum and ileum in the right lower quadrant on image number 42 series 2. This involves all loop of small bowel showing luminal narrowing which could represent stricture. Proximal to this, there is moderate dilatation of jejunum, to about 3.8 cm. More proximal loops of jejunum are not distended at this time. There is minimal free fluid in the right lower quadrant and into the pelvis.  There are no acute musculoskeletal findings.  IMPRESSION: There is evidence of small bowel obstruction with transition near the junction of jejunum and ileum that appears due to stricturing. Most of the ileum shows moderate wall thickening indicative of inflammation. While this could be infectious, the possibility of inflammatory bowel disease should be considered.   Electronically Signed   By: Esperanza Heir M.D.   On: 08/26/2013 16:27    ENDOSCOPIC STUDIES: Colonoscopy and EGD by Dr Randa Evens in 2006.  Have asked resident to obtain the office notes and procedure reports.   IMPRESSION:   *  Acute onset  abdominal pain.  SBO by CT scan.  ? Acute infection VS IBD/Crohn's.  Does take regular, though not excessive, doses of NSAIDs, thes can cause intestinal injury.  The acute nature of sxs weighs against Crohn's.   *  Hx IBS 7 yrs ago.  Generally just has fatty food induced loose stools.   *  Hyponatremia.     PLAN:     *  Get KUB in AM to follow up SBO *  Stay on empiric Cipro and Flagyl for now. Keep NPO and support with IVF as doing *  Surgery consult looks to have been ordered.    Jennye Moccasin  08/27/2013, 1:58 PM Pager: [phone]      Attending physician's note   I have taken a history, examined the patient and reviewed the chart. I agree with the Advanced Practitioner's note, impression and recommendations. Partial SBO with abnormal thickening and transition zone in mid small bowel. Infectious causes possible, Crohn's possible but not typical as he had not had chronic symptoms, small bowel lesion possible. Continue IV antibiotics. Surgery consult pending. Consider CT enterography.  Meryl Dare, MD Clementeen Graham

## 2013-08-27 NOTE — Progress Notes (Signed)
FMTS Attending Note  The plan of care was discussed with the resident team. I agree with the assessment and plan as documented by the resident.   Dymond Gutt MD 

## 2013-08-28 ENCOUNTER — Inpatient Hospital Stay (HOSPITAL_COMMUNITY): Payer: BC Managed Care – PPO

## 2013-08-28 LAB — CBC
HCT: 41.3 % (ref 39.0–52.0)
MCHC: 35.6 g/dL (ref 30.0–36.0)
MCV: 89.4 fL (ref 78.0–100.0)
Platelets: 238 10*3/uL (ref 150–400)
RBC: 4.62 MIL/uL (ref 4.22–5.81)
RDW: 11.7 % (ref 11.5–15.5)

## 2013-08-28 LAB — BASIC METABOLIC PANEL
BUN: 8 mg/dL (ref 6–23)
Chloride: 100 mEq/L (ref 96–112)
Creatinine, Ser: 1.06 mg/dL (ref 0.50–1.35)
GFR calc Af Amer: >90 mL/min (ref >90)
GFR calc non Af Amer: 80 mL/min — ABNORMAL LOW (ref >90)
Potassium: 4.2 mEq/L (ref 3.5–5.1)
Sodium: 137 mEq/L (ref 135–145)

## 2013-08-28 MED ORDER — METRONIDAZOLE IN NACL 5-0.79 MG/ML-% IV SOLN
500.0000 mg | Freq: Three times a day (TID) | INTRAVENOUS | Status: DC
  Administered 2013-08-28 – 2013-08-29 (×4): 500 mg via INTRAVENOUS
  Filled 2013-08-28 (×5): qty 100

## 2013-08-28 NOTE — Progress Notes (Signed)
Family Medicine Teaching Service Daily Progress Note Intern Pager: [phone]  Patient name: Garrett Lin Medical record number: [id number] Date of birth: Mar 08, 1961 Age: 52 y.o. Gender: male  Primary Care Provider: Pcp Not In System Consultants: None Code Status: Full  Pt Overview and Major Events to Date:  11/23: CT concerning for SBO and ileitis; VSS; NPO;  D5 1/2 NS @ 125   Assessment and Plan: Garrett Lin is a 52 y.o. male presenting with abdominal pain . PMH is significant for IBS.   # SBO  - CT shows evidence of small bowel obstruction with transition near the junction of jejunum and ileum that appears due to stricturing. No Hx of abdominal surgery. No hernia present on exam. Possible due to IBD see abdominal pain below.  - per ED report, gen surg was consulted and stated nothing to do surgically at this time  - NPO; No NG tube currently will consider if Vomiting/nausea/distension occurs  - Zofran for nausea  - Dilaudid for pain control  - IVF: D5 1/2 NS @ 125  - CBC wnl & BMET (11/25):, Gluc 104, GFR 80 < 88 (on admit)  # Abdominal Pain  - Diffuse abdominal pain greatest in LLQ. Pt is Afebrile and VSS. Denies FHx of IBD. Reports Colonoscopy in his 32s w/ Polyps  - DDx: IBS vs IBD vs Cdiff (Abx ~ 74mo ago for sinus infection) vs Gastroenteritis  - Given Flagyl in ED; will hold off on additional abx at this time as patient is without elevated WBC or fever to indicate an infectious process  - Pain and Nausea control as above  - C diff Neg & FOBT: pending - CRP: elevated 2.7 - GI consulted: following - Start Flagyl today  # UTI - UA: Positive for Nitrites, Leuks (-), Small Hgb, Bili, and rare Bacteria - Afeb, No CVA tenderness - Cipro 400 BID:  day 2; to cover possible IBD  # elevated BP: may be related to pain vs patient with HTN  - BP last 24hrs: (125-134)/(84-88) 134/88 mmHg  - will continue to monitor and consider addition of anti-HTN medication if continues to  be elevated   FEN/GI: D5 1/2 NS @ 125; NPO Prophylaxis: Heparin   Disposition: Attending Fletke; Discharge home pending improved PO intake and Pain control  Subjective: Still endorses "heart burn" with some nausea, but no vomiting since admission. Wants to hold off on NG tube at this time. Had BM last night and has been passing gas.   Objective: Temp:  [97.7 F (36.5 C)-98 F (36.7 C)] 98 F (36.7 C) (11/25 0549) Pulse Rate:  [73-83] 83 (11/25 0549) Resp:  [20] 20 (11/25 0549) BP: (125-134)/(84-88) 134/88 mmHg (11/25 0549) SpO2:  [100 %] 100 % (11/25 0549) Physical Exam: Gen: WD/WN in NAD  HEENT: MMM; PERRLA  CV: RRR, No m/r/g; No LE Edema  Lungs: CTAB; Normal Resp. Effort  Abdomen: Mild LLQ and suprapubic tenderness; No rebounding; mild voluntary guarding, non-distended  Laboratory:  Recent Labs Lab 08/26/13 1355 08/27/13 0315 08/28/13 0620  WBC 7.0 6.3 5.3  HGB 14.1 16.4 14.7  HCT 38.4* 45.1 41.3  PLT 261 272 238    Recent Labs Lab 08/26/13 1355 08/27/13 0315 08/28/13 0620  NA 133* 134* 137  K 3.8 3.6 4.2  CL 95* 97 100  CO2 23 27 27   BUN 12 15 8   CREATININE 1.04 1.09 1.06  CALCIUM 9.4 8.9 8.9  PROT 7.6  --   --   BILITOT  1.1  --   --   ALKPHOS 97  --   --   ALT 24  --   --   AST 23  --   --   GLUCOSE 104* 111* 104*   Imaging/Diagnostic Tests: CT Abdomen  IMPRESSION:  There is evidence of small bowel obstruction with transition near  the junction of jejunum and ileum that appears due to stricturing.  Most of the ileum shows moderate wall thickening indicative of  inflammation. While this could be infectious, the possibility of  inflammatory bowel disease should be considered.  Wenda Low, MD 08/28/2013, 9:52 AM PGY-1,  Family Medicine FPTS Intern pager: [phone], text pages welcome

## 2013-08-28 NOTE — Progress Notes (Signed)
Patient feels fine.  Minimal pain.  No bowel movement yet.  C.diff negative.  Will continue to follow.  He wants to eat.  Not yet  Garrett Lin. Garrett Bon, MD, FACS [phone] [facility] Surgery

## 2013-08-28 NOTE — Progress Notes (Signed)
FMTS Attending Note  I personally saw and evaluated the patient. The plan of care was discussed with the resident team. I agree with the assessment and plan as documented by the resident.   Abdominal pain improved today, no nausea, no emesis, passing gas, had small BM, currently NPO  1. Abdominal pain/Ileitis vs stricture - appears to be clinically improving, appreciate GI and Surgery input, continue Ciprofloxicin and Flagyl. If not plan for additional workup by GI/surgery would consider starting clear liquid diet.  2. UTI - agree with Cipro  Donnella Sham MD

## 2013-08-28 NOTE — Progress Notes (Signed)
Patient ID: Garrett Lin, male   DOB: 09/16/1961, 52 y.o.   MRN: [id number]     Subjective: Patient is resting comfortably in bed this morning. He denies pain, passing flatus or having a BM this morning. No nausea/vomiting.   Objective: Vital signs in last 24 hours: Temp:  [97.7 F (36.5 C)-98 F (36.7 C)] 98 F (36.7 C) (11/25 0549) Pulse Rate:  [73-83] 83 (11/25 0549) Resp:  [20] 20 (11/25 0549) BP: (125-134)/(84-88) 134/88 mmHg (11/25 0549) SpO2:  [100 %] 100 % (11/25 0549) Last BM Date: 08/26/13  Intake/Output from previous day: 11/24 0701 - 11/25 0700 In: 2954.2 [I.V.:2954.2] Out: 2600 [Urine:2600] Intake/Output this shift:    PE: Abd: Mildly distended, tight abdomen, hypoactive bowel sounds Cardio: regular rate and rhythm Respiratory: CTAB  Lab Results:   Recent Labs  08/26/13 1355 08/27/13 0315  WBC 7.0 6.3  HGB 14.1 16.4  HCT 38.4* 45.1  PLT 261 272   BMET  Recent Labs  08/26/13 1355 08/27/13 0315  NA 133* 134*  K 3.8 3.6  CL 95* 97  CO2 23 27  GLUCOSE 104* 111*  BUN 12 15  CREATININE 1.04 1.09  CALCIUM 9.4 8.9   PT/INR No results found for this basename: LABPROT, INR,  in the last 72 hours CMP     Component Value Date/Time   NA 134* 08/27/2013 0315   K 3.6 08/27/2013 0315   CL 97 08/27/2013 0315   CO2 27 08/27/2013 0315   GLUCOSE 111* 08/27/2013 0315   BUN 15 08/27/2013 0315   CREATININE 1.09 08/27/2013 0315   CALCIUM 8.9 08/27/2013 0315   PROT 7.6 08/26/2013 1355   ALBUMIN 4.2 08/26/2013 1355   AST 23 08/26/2013 1355   ALT 24 08/26/2013 1355   ALKPHOS 97 08/26/2013 1355   BILITOT 1.1 08/26/2013 1355   GFRNONAA 77* 08/27/2013 0315   GFRAA 90* 08/27/2013 0315   Lipase  No results found for this basename: lipase       Studies/Results: Ct Abdomen Pelvis W Contrast  08/26/2013   CLINICAL DATA:  Abdominal pain for 4 days with nausea and vomiting  EXAM: CT ABDOMEN AND PELVIS WITH CONTRAST  TECHNIQUE: Multidetector CT imaging  of the abdomen and pelvis was performed using the standard protocol following bolus administration of intravenous contrast.  CONTRAST:  OMNIPAQUE IOHEXOL 300 MG/ML  SOLN  COMPARISON:  12/13/2005  FINDINGS: Visualized portions of the lung bases are clear.  Liver is normal. Gallbladder is normal. Spleen is normal. Pancreas is normal. Adrenal glands are normal. Kidneys are normal except for tiny subcapsular focus of low attenuation midpole left kidney posteriorly likely not of clinical significance. Aorta is not dilated but the show mild calcification. Bladder and reproductive organs are normal.  The colon is decompressed. There is moderate wall thickening of most of the ileum. Transition and small bowel caliber occurs near the junction of the distal jejunum and ileum in the right lower quadrant on image number 42 series 2. This involves all loop of small bowel showing luminal narrowing which could represent stricture. Proximal to this, there is moderate dilatation of jejunum, to about 3.8 cm. More proximal loops of jejunum are not distended at this time. There is minimal free fluid in the right lower quadrant and into the pelvis.  There are no acute musculoskeletal findings.  IMPRESSION: There is evidence of small bowel obstruction with transition near the junction of jejunum and ileum that appears due to stricturing. Most of the ileum  shows moderate wall thickening indicative of inflammation. While this could be infectious, the possibility of inflammatory bowel disease should be considered.   Electronically Signed   By: Esperanza Heir M.D.   On: 08/26/2013 16:27    Anti-infectives: Anti-infectives   Start     Dose/Rate Route Frequency Ordered Stop   08/27/13 1900  ciprofloxacin (CIPRO) IVPB 400 mg     400 mg 200 mL/hr over 60 Minutes Intravenous Every 12 hours 08/27/13 0805     08/27/13 0700  ciprofloxacin (CIPRO) IVPB 200 mg  Status:  Discontinued     200 mg 100 mL/hr over 60 Minutes Intravenous  Every 12 hours 08/27/13 0647 08/27/13 0805   08/26/13 1830  metroNIDAZOLE (FLAGYL) IVPB 500 mg     500 mg 100 mL/hr over 60 Minutes Intravenous  Once 08/26/13 1824 08/26/13 1933       Assessment/Plan No NG tube necessary at this time since patient is denying nausea or vomiting. Continue NPO at this time with the exception of ice chips due to no flatus or bowel movement in several days. Continue to follow   LOS: 2 days    Alysia Penna, PA-Student [facility] 08/28/2013, 7:46 AM

## 2013-08-28 NOTE — ED Provider Notes (Signed)
Medical screening examination/treatment/procedure(s) were performed by non-physician practitioner and as supervising physician I was immediately available for consultation/collaboration.  Gilda Crease, MD 08/28/13 [phone]

## 2013-08-28 NOTE — Progress Notes (Signed)
Daily Rounding Note  08/28/2013, 11:29 AM  LOS: 2 days   SUBJECTIVE:       Last pain shot was 930 this AM.  Has liquid/soft BM and flatus this AM, this was after the Xray.  No nausea.  Hungry   OBJECTIVE:         Vital signs in last 24 hours:    Temp:  [97.7 F (36.5 C)-98 F (36.7 C)] 98 F (36.7 C) (11/25 0549) Pulse Rate:  [73-83] 83 (11/25 0549) Resp:  [20] 20 (11/25 0549) BP: (125-134)/(84-88) 134/88 mmHg (11/25 0549) SpO2:  [100 %] 100 % (11/25 0549) Last BM Date: 08/26/13 General: looks well   Heart: RRR Chest: clear bil. Abdomen: soft, some BS, not distended.   Extremities: no CCE Neuro/Psych:  Pleasant, alert.   Intake/Output from previous day: 11/24 0701 - 11/25 0700 In: 2954.2 [I.V.:2954.2] Out: 2600 [Urine:2600]  Intake/Output this shift:    Lab Results:  Recent Labs  08/26/13 1355 08/27/13 0315 08/28/13 0620  WBC 7.0 6.3 5.3  HGB 14.1 16.4 14.7  HCT 38.4* 45.1 41.3  PLT 261 272 238   BMET  Recent Labs  08/26/13 1355 08/27/13 0315 08/28/13 0620  NA 133* 134* 137  K 3.8 3.6 4.2  CL 95* 97 100  CO2 23 27 27   GLUCOSE 104* 111* 104*  BUN 12 15 8   CREATININE 1.04 1.09 1.06  CALCIUM 9.4 8.9 8.9   LFT  Recent Labs  08/26/13 1355  PROT 7.6  ALBUMIN 4.2  AST 23  ALT 24  ALKPHOS 97  BILITOT 1.1    Studies/Results: Dg Abd 1 View 08/28/2013   CLINICAL DATA:  Small bowel obstruction on CT  EXAM: ABDOMEN - 1 VIEW  COMPARISON:  CT abdomen pelvis of 08/26/2013  FINDINGS: There is still gaseous distention of small bowel consistent with at least a partial small bowel obstruction. A small amount of air is noted within the colon, and a small amount of contrast is present throughout the nondistended colon. No bony abnormality is seen.  IMPRESSION: Persistent partial small bowel obstruction.   Electronically Signed   By: Dwyane Dee M.D.   On: 08/28/2013 08:10   Ct Abdomen Pelvis W  Contrast 08/26/2013  FINDINGS: Visualized portions of the lung bases are clear.  Liver is normal. Gallbladder is normal. Spleen is normal. Pancreas is normal. Adrenal glands are normal. Kidneys are normal except for tiny subcapsular focus of low attenuation midpole left kidney posteriorly likely not of clinical significance. Aorta is not dilated but the show mild calcification. Bladder and reproductive organs are normal.  The colon is decompressed. There is moderate wall thickening of most of the ileum. Transition and small bowel caliber occurs near the junction of the distal jejunum and ileum in the right lower quadrant on image number 42 series 2. This involves all loop of small bowel showing luminal narrowing which could represent stricture. Proximal to this, there is moderate dilatation of jejunum, to about 3.8 cm. More proximal loops of jejunum are not distended at this time. There is minimal free fluid in the right lower quadrant and into the pelvis.  There are no acute musculoskeletal findings.  IMPRESSION: There is evidence of small bowel obstruction with transition near the junction of jejunum and ileum that appears due to stricturing. Most of the ileum shows moderate wall thickening indicative of inflammation. While this could be infectious, the possibility of inflammatory bowel disease should be  considered.   Electronically Signed   By: Esperanza Heir M.D.   On: 08/26/2013 16:27    ASSESMENT:   * Acute onset abdominal pain. SBO by CT scan, still present on Xray today. ? Acute infection vs Crohn's. Does take regular, though not excessive, doses of NSAIDs, these can cause intestinal injury. The acute nature of sxs weighs against Crohn's. Dr Lindie Spruce following.  * Hx IBS 7 yrs ago. Generally just has fatty food induced loose stools.  * Hyponatremia.     PLAN   *  Xray in AM *  Ice chips ok.    Jennye Moccasin  08/28/2013, 11:29 AM Pager: [phone]    Attending physician's note   I have  taken an interval history, reviewed the chart and examined the patient. I agree with the Advanced Practitioner's note, impression and recommendations. Abdominal pain has improved. Significant flatus and a bowel movement today. Follow up abdominal films in am. Consider SBFT or CT enterography to further evaluate. Surgery following. Can try clear liquids.   Venita Lick. Russella Dar, MD [facility]

## 2013-08-29 ENCOUNTER — Inpatient Hospital Stay (HOSPITAL_COMMUNITY): Payer: BC Managed Care – PPO

## 2013-08-29 ENCOUNTER — Encounter (HOSPITAL_COMMUNITY): Payer: Self-pay | Admitting: Radiology

## 2013-08-29 MED ORDER — IOHEXOL 300 MG/ML  SOLN
100.0000 mL | Freq: Once | INTRAMUSCULAR | Status: AC | PRN
  Administered 2013-08-29: 100 mL via INTRAVENOUS

## 2013-08-29 MED ORDER — IBUPROFEN 400 MG PO TABS
400.0000 mg | ORAL_TABLET | Freq: Four times a day (QID) | ORAL | Status: DC | PRN
  Administered 2013-08-30 – 2013-09-01 (×6): 400 mg via ORAL
  Filled 2013-08-29 (×3): qty 2
  Filled 2013-08-29 (×4): qty 1

## 2013-08-29 MED ORDER — ACETAMINOPHEN 325 MG PO TABS
650.0000 mg | ORAL_TABLET | ORAL | Status: DC | PRN
  Administered 2013-08-29: 650 mg via ORAL
  Filled 2013-08-29: qty 2

## 2013-08-29 NOTE — ED Provider Notes (Signed)
Medical screening examination/treatment/procedure(s) were performed by non-physician practitioner and as supervising physician I was immediately available for consultation/collaboration.  EKG Interpretation    Date/Time:    Ventricular Rate:    PR Interval:    QRS Duration:   QT Interval:    QTC Calculation:   R Axis:     Text Interpretation:                Roney Marion, MD 08/29/13 [phone]

## 2013-08-29 NOTE — Progress Notes (Signed)
Daily Rounding Note  08/29/2013, 10:48 AM  LOS: 3 days   SUBJECTIVE:       No nausea or vomiting, feels well. No pain, just somewhat sore like he did too many sit ups.  Tolerating clears  OBJECTIVE:         Vital signs in last 24 hours:    Temp:  [98.3 F (36.8 C)] 98.3 F (36.8 C) (11/26 0540) Pulse Rate:  [78-80] 80 (11/26 0540) Resp:  [18-20] 18 (11/26 0540) BP: (118-142)/(72-92) 118/72 mmHg (11/26 0540) SpO2:  [100 %] 100 % (11/26 0540) Last BM Date: 08/28/13 General: looks well   Heart: RRR Chest: clear bil Abdomen: soft, NT, ND, hypoactive BS, no tinkling BS  Extremities: no CCE Neuro/Psych:  Alert, no confusion, no weakness.   Intake/Output from previous day: 11/25 0701 - 11/26 0700 In: 2800 [P.O.:400; I.V.:2400] Out: -   Intake/Output this shift:    Lab Results:  Recent Labs  08/26/13 1355 08/27/13 0315 08/28/13 0620  WBC 7.0 6.3 5.3  HGB 14.1 16.4 14.7  HCT 38.4* 45.1 41.3  PLT 261 272 238   BMET  Recent Labs  08/26/13 1355 08/27/13 0315 08/28/13 0620  NA 133* 134* 137  K 3.8 3.6 4.2  CL 95* 97 100  CO2 23 27 27   GLUCOSE 104* 111* 104*  BUN 12 15 8   CREATININE 1.04 1.09 1.06  CALCIUM 9.4 8.9 8.9   LFT  Recent Labs  08/26/13 1355  PROT 7.6  ALBUMIN 4.2  AST 23  ALT 24  ALKPHOS 97  BILITOT 1.1    Studies/Results: Dg Abd 1 View 08/29/2013   CLINICAL DATA:  Abdominal pain and distention, follow-up small bowel obstruction  EXAM: ABDOMEN - 1 VIEW  COMPARISON:  Abdomen films of 08/28/2013  FINDINGS: There is little change in gaseous distention of small bowel consistent with persistent partial small bowel obstruction. There is some colonic bowel gas present.  IMPRESSION: Little change in gaseous distention of small bowel consistent with partial small bowel obstruction.   Electronically Signed   By: Dwyane Dee M.D.   On: 08/29/2013 08:07   Dg Abd 1 View 08/28/2013   CLINICAL  DATA:  Small bowel obstruction on CT  EXAM: ABDOMEN - 1 VIEW  COMPARISON:  CT abdomen pelvis of 08/26/2013  FINDINGS: There is still gaseous distention of small bowel consistent with at least a partial small bowel obstruction. A small amount of air is noted within the colon, and a small amount of contrast is present throughout the nondistended colon. No bony abnormality is seen.  IMPRESSION: Persistent partial small bowel obstruction.   Electronically Signed   By: Dwyane Dee M.D.   On: 08/28/2013 08:10    ASSESMENT:   *  Persistent PSBO by X ray, clinically no signs of obstruction. Initial CT with strictured appearance at jejunal-ileal junction. b ? Infectious (food poisoning?) vs Crohn's Note that surgery has signed off.     PLAN   *  Stop Protonix, stop cipro and flagyl.  *  Start regular diet *  ? Need to image SB with CT enterography or SBFT, clinically not acting obstructed so not sure these tests required now, but what about in future (outpt) to rule out Crohn's   Jennye Moccasin  08/29/2013, 10:48 AM Pager: [phone]    Attending physician's note   I have taken an interval history, reviewed the chart and examined the patient. I agree with the  Advanced Practitioner's note, impression and recommendations. Given persistently abnormal abd films would proceed with CT enterography now to further evaluate.   Venita Lick. Russella Dar, MD [facility]

## 2013-08-29 NOTE — Progress Notes (Signed)
Family Medicine Teaching Service Daily Progress Note Intern Pager: [phone]  Patient name: Garrett Lin Medical record number: [id number] Date of birth: 04-05-1961 Age: 52 y.o. Gender: male  Primary Care Provider: Pcp Not In System Consultants: None Code Status: Full  Pt Overview and Major Events to Date:  11/23: CT concerning for SBO and ileitis; VSS; NPO;  D5 1/2 NS @ 125  11/26: advancing diet  Assessment and Plan: Garrett Lin is a 52 y.o. male presenting with abdominal pain . PMH is significant for IBS.   # SBO  - CT shows evidence of small bowel obstruction with transition near the junction of jejunum and ileum that appears due to stricturing. No Hx of abdominal surgery. No hernia present on exam. Possible due to IBD see abdominal pain below.  - per ED report, gen surg was consulted and stated nothing to do surgically at this time  - Advancing diet as tolerated - Zofran for nausea  - Dilaudid for pain control  - IVF: D5 1/2 NS @ 75  # Abdominal Pain  - Diffuse abdominal pain greatest in LLQ. Pt is Afebrile and VSS. Denies FHx of IBD. Reports Colonoscopy in his 50s w/ Polyps  - DDx: IBS vs IBD vs Cdiff (Abx ~ 26mo ago for sinus infection) vs Gastroenteritis  - Given Flagyl in ED; will hold off on additional abx at this time as patient is without elevated WBC or fever to indicate an infectious process  - Pain and Nausea control as above  - C diff Neg & FOBT: pending - CRP: elevated 2.7 - GI consulted: following - Flagyl day 2  # UTI - UA: Positive for Nitrites, Leuks (-), Small Hgb, Bili, and rare Bacteria - Afeb, No CVA tenderness - Cipro 400 BID:  day 3; to cover possible IBD  # elevated BP: may be related to pain vs patient with HTN  - Resolved  FEN/GI: D5 1/2 NS @ 75; NPO Prophylaxis: Heparin   Disposition: Attending Fletke; Discharge home pending improved PO intake and Pain control  Subjective:Denies "heart burn"  and nausea. Has been passing gas, with  improved abdominal pain and able to tolerate liquids yesterday.   Objective: Temp:  [98.3 F (36.8 C)] 98.3 F (36.8 C) (11/26 0540) Pulse Rate:  [78-80] 80 (11/26 0540) Resp:  [18-20] 18 (11/26 0540) BP: (118-142)/(72-92) 118/72 mmHg (11/26 0540) SpO2:  [100 %] 100 % (11/26 0540) Physical Exam: Gen: WD/WN in NAD  HEENT: MMM; PERRLA  CV: RRR, No m/r/g; No LE Edema  Lungs: CTAB; Normal Resp. Effort  Abdomen: Mild LLQ and suprapubic tenderness; No rebounding; mild voluntary guarding, non-distended  Laboratory:  Recent Labs Lab 08/26/13 1355 08/27/13 0315 08/28/13 0620  WBC 7.0 6.3 5.3  HGB 14.1 16.4 14.7  HCT 38.4* 45.1 41.3  PLT 261 272 238    Recent Labs Lab 08/26/13 1355 08/27/13 0315 08/28/13 0620  NA 133* 134* 137  K 3.8 3.6 4.2  CL 95* 97 100  CO2 23 27 27   BUN 12 15 8   CREATININE 1.04 1.09 1.06  CALCIUM 9.4 8.9 8.9  PROT 7.6  --   --   BILITOT 1.1  --   --   ALKPHOS 97  --   --   ALT 24  --   --   AST 23  --   --   GLUCOSE 104* 111* 104*   Imaging/Diagnostic Tests: CT Abdomen  IMPRESSION:  There is evidence of small bowel obstruction with transition  near  the junction of jejunum and ileum that appears due to stricturing.  Most of the ileum shows moderate wall thickening indicative of  inflammation. While this could be infectious, the possibility of  inflammatory bowel disease should be considered.  Wenda Low, MD 08/29/2013, 8:55 AM PGY-1, [place] [facility] FPTS Intern pager: [phone], text pages welcome

## 2013-08-29 NOTE — Progress Notes (Signed)
This patient is doing well and has no surgical problems.  Will sign off and you can advance his diet as tolerated.  Marta Lamas. Gae Bon, MD, FACS [phone] [facility] Surgery

## 2013-08-29 NOTE — Progress Notes (Signed)
FMTS Attending Note  I personally saw and evaluated the patient. The plan of care was discussed with the resident team. I agree with the assessment and plan as documented by the resident.   Abdominal pain improved, tolerating clears, no BM today, passing gas, no nausea.  Agree with advance diet as tolerated. If able to tolerate lunch Ok for discharge from [facility] standpoint. Will need to check with GI and Surgery to see if they are [facility] with discharge.   Donnella Sham

## 2013-08-29 NOTE — Progress Notes (Signed)
Patient ID: Garrett Lin, male   DOB: March 12, 1961, 52 y.o.   MRN: [id number]    Subjective: Patient is resting comfortably in bed this morning. He has been up and walking around in the halls. He states he feels much better today and is ready to go home. He has been passing flatus and had a BM yesterday morning. He states he is hungry and denies N/V.   Objective: Vital signs in last 24 hours: Temp:  [98.3 F (36.8 C)] 98.3 F (36.8 C) (11/26 0540) Pulse Rate:  [78-80] 80 (11/26 0540) Resp:  [18-20] 18 (11/26 0540) BP: (118-142)/(72-92) 118/72 mmHg (11/26 0540) SpO2:  [100 %] 100 % (11/26 0540) Last BM Date: 08/28/13  Intake/Output from previous day: 11/25 0701 - 11/26 0700 In: 2800 [P.O.:400; I.V.:2400] Out: -  Intake/Output this shift:    PE: Abd: soft, non-tender, normal bowel sounds Cardio: regular rate and rhythm Respiratory: CTAB  Lab Results:   Recent Labs  08/27/13 0315 08/28/13 0620  WBC 6.3 5.3  HGB 16.4 14.7  HCT 45.1 41.3  PLT 272 238   BMET  Recent Labs  08/27/13 0315 08/28/13 0620  NA 134* 137  K 3.6 4.2  CL 97 100  CO2 27 27  GLUCOSE 111* 104*  BUN 15 8  CREATININE 1.09 1.06  CALCIUM 8.9 8.9   PT/INR No results found for this basename: LABPROT, INR,  in the last 72 hours CMP     Component Value Date/Time   NA 137 08/28/2013 0620   K 4.2 08/28/2013 0620   CL 100 08/28/2013 0620   CO2 27 08/28/2013 0620   GLUCOSE 104* 08/28/2013 0620   BUN 8 08/28/2013 0620   CREATININE 1.06 08/28/2013 0620   CALCIUM 8.9 08/28/2013 0620   PROT 7.6 08/26/2013 1355   ALBUMIN 4.2 08/26/2013 1355   AST 23 08/26/2013 1355   ALT 24 08/26/2013 1355   ALKPHOS 97 08/26/2013 1355   BILITOT 1.1 08/26/2013 1355   GFRNONAA 80* 08/28/2013 0620   GFRAA >90 08/28/2013 0620   Lipase  No results found for this basename: lipase       Studies/Results: Dg Abd 1 View  08/28/2013   CLINICAL DATA:  Small bowel obstruction on CT  EXAM: ABDOMEN - 1 VIEW   COMPARISON:  CT abdomen pelvis of 08/26/2013  FINDINGS: There is still gaseous distention of small bowel consistent with at least a partial small bowel obstruction. A small amount of air is noted within the colon, and a small amount of contrast is present throughout the nondistended colon. No bony abnormality is seen.  IMPRESSION: Persistent partial small bowel obstruction.   Electronically Signed   By: Dwyane Dee M.D.   On: 08/28/2013 08:10    Anti-infectives: Anti-infectives   Start     Dose/Rate Route Frequency Ordered Stop   08/28/13 1030  metroNIDAZOLE (FLAGYL) IVPB 500 mg     500 mg 100 mL/hr over 60 Minutes Intravenous Every 8 hours 08/28/13 1017     08/27/13 1900  ciprofloxacin (CIPRO) IVPB 400 mg     400 mg 200 mL/hr over 60 Minutes Intravenous Every 12 hours 08/27/13 0805     08/27/13 0700  ciprofloxacin (CIPRO) IVPB 200 mg  Status:  Discontinued     200 mg 100 mL/hr over 60 Minutes Intravenous Every 12 hours 08/27/13 0647 08/27/13 0805   08/26/13 1830  metroNIDAZOLE (FLAGYL) IVPB 500 mg     500 mg 100 mL/hr over 60 Minutes Intravenous  Once 08/26/13 1824 08/26/13 1933       Assessment/Plan Okay to advance diet as tolerated per primary team   LOS: 3 days    Alysia Penna, PA-Student [employer] 08/29/2013, 7:42 AM Pager: [phone]

## 2013-08-29 NOTE — Plan of Care (Signed)
Problem: Phase II Progression Outcomes Goal: IV changed to normal saline lock Outcome: Not Applicable Date Met:  08/29/13 Iv fluids down to kvo

## 2013-08-30 LAB — URINALYSIS, ROUTINE W REFLEX MICROSCOPIC
Bilirubin Urine: NEGATIVE
Glucose, UA: NEGATIVE mg/dL
Hgb urine dipstick: NEGATIVE
Ketones, ur: 15 mg/dL — AB
Nitrite: NEGATIVE
Protein, ur: NEGATIVE mg/dL
Specific Gravity, Urine: 1.023 (ref 1.005–1.030)
Urobilinogen, UA: 1 mg/dL (ref 0.0–1.0)

## 2013-08-30 MED ORDER — PANTOPRAZOLE SODIUM 20 MG PO TBEC
20.0000 mg | DELAYED_RELEASE_TABLET | Freq: Two times a day (BID) | ORAL | Status: DC
  Administered 2013-08-30 – 2013-09-04 (×10): 20 mg via ORAL
  Filled 2013-08-30 (×12): qty 1

## 2013-08-30 MED ORDER — CIPROFLOXACIN HCL 250 MG PO TABS
250.0000 mg | ORAL_TABLET | Freq: Two times a day (BID) | ORAL | Status: AC
  Administered 2013-08-30 – 2013-09-01 (×5): 250 mg via ORAL
  Filled 2013-08-30 (×6): qty 1

## 2013-08-30 NOTE — Progress Notes (Signed)
[facility] Progress Note  Subjective:  Vomited the last bit of contrast before CT scan last PM, but no nausea or vomiting this AM even after eating breakfast.  Mild diffuse abdominal discomfort.  Passing flatus and had two loose BM's this AM.  CT enterography last PM showed the following:  IMPRESSION:  Distal small bowel obstruction, with transition point involving the  distal ileum, suspicious for adhesion. Resolution of distal small bowel wall thickening since previous study. No evidence of active inflammatory process or involvement of the terminal ileum.   Objective:  Vital signs in last 24 hours: Temp:  [98.1 F (36.7 C)-98.5 F (36.9 C)] 98.4 F (36.9 C) (11/27 0612) Pulse Rate:  [71-85] 77 (11/27 0612) Resp:  [18] 18 (11/27 0612) BP: (121-133)/(76-84) 133/76 mmHg (11/27 0612) SpO2:  [100 %] 100 % (11/27 0612) Last BM Date: 08/29/13 General:   Alert, Well-developed, in NAD Heart:  Regular rate and rhythm; no murmurs Pulm:  CTAB.  No W/R/R. Abdomen:  Soft, non-distended.  BS present but quiet.  Minimal lower abdominal TTP without R/R/G. Extremities:  Without edema. Neurologic:  Alert and  oriented x4;  grossly normal neurologically. Psych:  Alert and cooperative. Normal mood and affect.  Intake/Output from previous day: 11/26 0701 - 11/27 0700 In: 1259.2 [P.O.:480; I.V.:679.2; IV Piggyback:100] Out: 850 [Urine:850]  Lab Results:  Recent Labs  08/28/13 0620  WBC 5.3  HGB 14.7  HCT 41.3  PLT 238   BMET  Recent Labs  08/28/13 0620  NA 137  K 4.2  CL 100  CO2 27  GLUCOSE 104*  BUN 8  CREATININE 1.06  CALCIUM 8.9   Dg Abd 1 View  08/29/2013   CLINICAL DATA:  Abdominal pain and distention, follow-up small bowel obstruction  EXAM: ABDOMEN - 1 VIEW  COMPARISON:  Abdomen films of 08/28/2013  FINDINGS: There is little change in gaseous distention of small bowel consistent with persistent partial small bowel obstruction. There is some colonic bowel  gas present.  IMPRESSION: Little change in gaseous distention of small bowel consistent with partial small bowel obstruction.   Electronically Signed   By: Dwyane Dee M.D.   On: 08/29/2013 08:07   Ct Entero Abd/pelvis W/cm  08/29/2013   CLINICAL DATA:  Diffuse abdominal pain. Vomiting. Small bowel obstruction. Evaluate for Crohn's disease.  EXAM: CT ABDOMEN AND PELVIS WITH CONTRAST (ENTEROGRAPHY)  TECHNIQUE: Multidetector CT of the abdomen and pelvis during bolus administration of intravenous contrast. Negative oral contrast VoLumen was given.  CONTRAST:  OMNIPAQUE IOHEXOL 300 MG/ML  SOLN  COMPARISON:  08/26/2013  FINDINGS: Moderately dilated small bowel loops are seen containing multiple air-fluid levels, with a transition point to nondilated distal ileum seen in the right lower quadrant. This is consistent with a distal small bowel obstruction and is suspicious for an adhesion. Previously seen wall thickening of distal small bowel loops in the right lower quadrant is no longer visualized. There is no evidence of bowel wall thickening involving the terminal ileum. There is no evidence of abnormal mucosal enhancement or adjacent mesenteric inflammatory changes. No evidence of fistula or abscess.  Small hiatal hernia noted. The liver, gallbladder, pancreas, spleen, adrenal glands, and kidneys are normal in appearance. No soft tissue masses or lymphadenopathy identified within the abdomen or pelvis. No evidence of inflammatory process or abnormal fluid collections. Normal appendix visualized.  IMPRESSION: Distal small bowel obstruction, with transition point involving the distal ileum, suspicious for adhesion.  Resolution of distal small bowel wall thickening  since previous study. No evidence of active inflammatory process or involvement of the terminal ileum.   Electronically Signed   By: Myles Rosenthal M.D.   On: 08/29/2013 20:14    Assessment / Plan: * Persistent distal SBO confirmed by CT enterography  (adhesion is suspicious source as there is no further bowel wall thickening or inflammatory process noted).  Clinically no signs of obstruction.  -Surgery should be re-consulted. -? If diet should be decreased back to liquids.    LOS: 4 days   ZEHR, JESSICA D.  08/30/2013, 8:50 AM  Pager number [phone]     Attending physician's note   I have taken an interval history, reviewed the chart and examined the patient. I agree with the Advanced Practitioner's note, impression and recommendations. CT enterography shows a distal SBO, adhesion suspected, and prior SB thickening has resolved. He vomited yesterday, has mild abd pain and some flatus. Resume clear liquid diet. Re-consult surgery today.  Venita Lick. Russella Dar, MD [facility]

## 2013-08-30 NOTE — Progress Notes (Signed)
Seen and agree  

## 2013-08-30 NOTE — Progress Notes (Signed)
Subjective: Pt vomited yesterday after drinking contrast for CT scan.  Prior to that he was tolerating a diet. He reports several loose BMs, diarrhea.  +flatus this morning.  Now taking liquids.  We have been reconsulted due to CT enterography revealing a distal SBO with a transition point at distal ileum.  He does not have previous abdominal surgeries.  He reports having a colonoscopy and endoscopy 7-8 years ago.    Objective: Vital signs in last 24 hours: Temp:  [98.1 F (36.7 C)-98.5 F (36.9 C)] 98.4 F (36.9 C) (11/27 0612) Pulse Rate:  [71-85] 77 (11/27 0612) Resp:  [18] 18 (11/27 0612) BP: (121-133)/(76-84) 133/76 mmHg (11/27 0612) SpO2:  [100 %] 100 % (11/27 0612) Last BM Date: 08/29/13  Intake/Output from previous day: 11/26 0701 - 11/27 0700 In: 1259.2 [P.O.:480; I.V.:679.2; IV Piggyback:100] Out: 850 [Urine:850] Intake/Output this shift:   Physical Exam:  General appearance: alert, cooperative and no distress GI: bowel sounds are present. abdomen is soft and non tender.  No evidence of hernias or masses.    Lab Results:   Recent Labs  08/28/13 0620  WBC 5.3  HGB 14.7  HCT 41.3  PLT 238   BMET  Recent Labs  08/28/13 0620  NA 137  K 4.2  CL 100  CO2 27  GLUCOSE 104*  BUN 8  CREATININE 1.06  CALCIUM 8.9   PT/INR No results found for this basename: LABPROT, INR,  in the last 72 hours ABG No results found for this basename: PHART, PCO2, PO2, HCO3,  in the last 72 hours  Studies/Results: Dg Abd 1 View  08/29/2013   CLINICAL DATA:  Abdominal pain and distention, follow-up small bowel obstruction  EXAM: ABDOMEN - 1 VIEW  COMPARISON:  Abdomen films of 08/28/2013  FINDINGS: There is little change in gaseous distention of small bowel consistent with persistent partial small bowel obstruction. There is some colonic bowel gas present.  IMPRESSION: Little change in gaseous distention of small bowel consistent with partial small bowel obstruction.    Electronically Signed   By: Dwyane Dee M.D.   On: 08/29/2013 08:07   Ct Entero Abd/pelvis W/cm  08/29/2013   CLINICAL DATA:  Diffuse abdominal pain. Vomiting. Small bowel obstruction. Evaluate for Crohn's disease.  EXAM: CT ABDOMEN AND PELVIS WITH CONTRAST (ENTEROGRAPHY)  TECHNIQUE: Multidetector CT of the abdomen and pelvis during bolus administration of intravenous contrast. Negative oral contrast VoLumen was given.  CONTRAST:  OMNIPAQUE IOHEXOL 300 MG/ML  SOLN  COMPARISON:  08/26/2013  FINDINGS: Moderately dilated small bowel loops are seen containing multiple air-fluid levels, with a transition point to nondilated distal ileum seen in the right lower quadrant. This is consistent with a distal small bowel obstruction and is suspicious for an adhesion. Previously seen wall thickening of distal small bowel loops in the right lower quadrant is no longer visualized. There is no evidence of bowel wall thickening involving the terminal ileum. There is no evidence of abnormal mucosal enhancement or adjacent mesenteric inflammatory changes. No evidence of fistula or abscess.  Small hiatal hernia noted. The liver, gallbladder, pancreas, spleen, adrenal glands, and kidneys are normal in appearance. No soft tissue masses or lymphadenopathy identified within the abdomen or pelvis. No evidence of inflammatory process or abnormal fluid collections. Normal appendix visualized.  IMPRESSION: Distal small bowel obstruction, with transition point involving the distal ileum, suspicious for adhesion.  Resolution of distal small bowel wall thickening since previous study. No evidence of active inflammatory process or involvement  of the terminal ileum.   Electronically Signed   By: Myles Rosenthal M.D.   On: 08/29/2013 20:14    Anti-infectives: Anti-infectives   Start     Dose/Rate Route Frequency Ordered Stop   08/30/13 1230  ciprofloxacin (CIPRO) tablet 250 mg     250 mg Oral 2 times daily 08/30/13 1133 09/01/13 1959    08/28/13 1030  metroNIDAZOLE (FLAGYL) IVPB 500 mg  Status:  Discontinued     500 mg 100 mL/hr over 60 Minutes Intravenous Every 8 hours 08/28/13 1017 08/29/13 1115   08/27/13 1900  ciprofloxacin (CIPRO) IVPB 400 mg  Status:  Discontinued     400 mg 200 mL/hr over 60 Minutes Intravenous Every 12 hours 08/27/13 0805 08/29/13 1115   08/27/13 0700  ciprofloxacin (CIPRO) IVPB 200 mg  Status:  Discontinued     200 mg 100 mL/hr over 60 Minutes Intravenous Every 12 hours 08/27/13 0647 08/27/13 0805   08/26/13 1830  metroNIDAZOLE (FLAGYL) IVPB 500 mg     500 mg 100 mL/hr over 60 Minutes Intravenous  Once 08/26/13 1824 08/26/13 1933      Assessment/Plan: PSBO: no indications for surgical intervention at present time.  No previous abdominal surgeries, he could potentially have an adhesion causing his problem, however, the risk is low.  No palpable masses or hernias or exam or CT scan. We recommend continuing with conservative management for 24-48 hours.  If no improvement, proceed with a diagnostic laparoscopy. We can hold off on placing NGT. Should he develop vomiting, insert NGT and place to low intermittent suction.  He may have ice chips and sips of clears.  Repeat XR in AM.      LOS: 4 days    Leamon Palau ANP-BC 08/30/2013 11:56 AM

## 2013-08-30 NOTE — Progress Notes (Signed)
Family Medicine Teaching Service Daily Progress Note Intern Pager: [phone]  Patient name: Garrett Lin Medical record number: [id number] Date of birth: 07-20-1961 Age: 52 y.o. Gender: male  Primary Care Provider: Pcp Not In System Consultants: None Code Status: Full  Pt Overview and Major Events to Date:  11/23: CT concerning for SBO and ileitis; VSS; NPO;  D5 1/2 NS @ 125  11/26: advancing diet 11/27: Surgery consulted due to persistent stricture on CT  Assessment and Plan: Garrett Lin is a 52 y.o. male presenting with abdominal pain . PMH is significant for IBS.   # SBO  - CT shows evidence of small bowel obstruction with transition near the junction of jejunum and ileum that appears due to stricturing. No Hx of abdominal surgery. No hernia present on exam. Possible due to IBD see abdominal pain below.  - per ED report, gen surg was consulted and stated nothing to do surgically at this time  - Advancing diet as tolerated - Zofran for nausea  - Dilaudid for pain control  - IVF: D5 1/2 NS @ 75  # Abdominal Pain  - Diffuse abdominal pain greatest in LLQ. Pt is Afebrile and VSS. Denies FHx of IBD. Reports Colonoscopy in his 23s w/ Polyps; C diff Neg & CRP: elevated 2.7.  - DDx: SBO vs IBS vs IBD vs Cdiff (Abx ~ 89mo ago for sinus infection) vs Gastroenteritis  - GI consulted: Rec Consult surgery  - Flagyl day 3 stop today  # UTI - UA: Positive for Nitrites, Leuks (-), Small Hgb, Bili, and rare Bacteria - Afeb, No CVA tenderness - Cipro decreased to 250 PO BID - UA order; post Abx  FEN/GI: D5 KVO; Diet clear liquids  Prophylaxis: Heparin   Disposition: Attending [facility]; Discharge home pending surgery recs  Subjective:  Has been passing gas. Still endorses crampy abdominal pain. Vomited due to CT contrast yesterday.   Objective: Temp:  [98.1 F (36.7 C)-98.5 F (36.9 C)] 98.4 F (36.9 C) (11/27 0612) Pulse Rate:  [71-85] 77 (11/27 0612) Resp:  [18] 18 (11/27  0612) BP: (121-133)/(76-84) 133/76 mmHg (11/27 0612) SpO2:  [100 %] 100 % (11/27 0612) Physical Exam: Gen: WD/WN in NAD  HEENT: MMM; PERRLA  CV: RRR, No m/r/g; No LE Edema  Lungs: CTAB; Normal Resp. Effort  Abdomen: Mild LLQ and suprapubic tenderness; No rebounding; mild voluntary guarding, non-distended  Laboratory:  Recent Labs Lab 08/26/13 1355 08/27/13 0315 08/28/13 0620  WBC 7.0 6.3 5.3  HGB 14.1 16.4 14.7  HCT 38.4* 45.1 41.3  PLT 261 272 238    Recent Labs Lab 08/26/13 1355 08/27/13 0315 08/28/13 0620  NA 133* 134* 137  K 3.8 3.6 4.2  CL 95* 97 100  CO2 23 27 27   BUN 12 15 8   CREATININE 1.04 1.09 1.06  CALCIUM 9.4 8.9 8.9  PROT 7.6  --   --   BILITOT 1.1  --   --   ALKPHOS 97  --   --   ALT 24  --   --   AST 23  --   --   GLUCOSE 104* 111* 104*   Imaging/Diagnostic Tests: CT Abdomen  IMPRESSION:  There is evidence of small bowel obstruction with transition near  the junction of jejunum and ileum that appears due to stricturing.  Most of the ileum shows moderate wall thickening indicative of  inflammation. While this could be infectious, the possibility of  inflammatory bowel disease should be considered.  Fayrene Fearing  Gayla Doss, MD 08/30/2013, 10:29 AM PGY-1, [facility] Family Medicine FPTS Intern pager: [phone], text pages welcome

## 2013-08-30 NOTE — Progress Notes (Signed)
FMTS Attending Admission Note: Kehinde Eniola,MD I  have seen and examined this patient, reviewed their chart. I have discussed this patient with the resident. I agree with the resident's findings, assessment and care plan.  Patient still having some mild generalized abdominal pain,no N/V, he mentioned he eat egg with toast this morning even though he was supposed to be NPO (GI probably upgraded his diet). He also c/o right calf pain which he has prior to admission intermittently,pain has not worsened about 4/10 in severity. He denies any trauma to his calf,no redness or swelling. On exam mild lower abdominal tenderness,normal BS. Mild right calf tenderness with no swelling or erythema.  Plan: SBO: Re-consult surgery since obstruction persist on repeat imaging.          Calf pain: Right, low index of suspicion for DVT,he is on DVT prophylaxis.                          If pain persist or worsen would obtain U/S to assess for DVT although unlikely.                          Continue pain control as needed.

## 2013-08-31 ENCOUNTER — Inpatient Hospital Stay (HOSPITAL_COMMUNITY): Payer: BC Managed Care – PPO

## 2013-08-31 DIAGNOSIS — M79609 Pain in unspecified limb: Secondary | ICD-10-CM

## 2013-08-31 LAB — CBC
HCT: 41 % (ref 39.0–52.0)
MCH: 32 pg (ref 26.0–34.0)
MCV: 88 fL (ref 78.0–100.0)
Platelets: 256 10*3/uL (ref 150–400)
RDW: 11.6 % (ref 11.5–15.5)
WBC: 7.1 10*3/uL (ref 4.0–10.5)

## 2013-08-31 LAB — BASIC METABOLIC PANEL
BUN: 9 mg/dL (ref 6–23)
Chloride: 102 mEq/L (ref 96–112)
Creatinine, Ser: 1 mg/dL (ref 0.50–1.35)
GFR calc Af Amer: >90 mL/min (ref >90)

## 2013-08-31 NOTE — Progress Notes (Signed)
Subjective:.   Patient is ambulating in the halls. Excellent spirits. Very positive about how he is doing.He states he feels great and would like a cheeseburger. Had 2 bowel movements. Denies pain.  Abdominal x-rays this morning showed lots of gas in the colon but there is still some distended small bowel, and so SBO cannot be ruled out.  CBC and b-me  t looked normal.  Objective: Vital signs in last 24 hours: Temp:  [97.5 F (36.4 C)-98 F (36.7 C)] 98 F (36.7 C) (11/28 0527) Pulse Rate:  [72-74] 72 (11/28 0527) Resp:  [16-20] 20 (11/28 0527) BP: (125-143)/(80-85) 143/85 mmHg (11/28 0527) SpO2:  [98 %-100 %] 100 % (11/28 0527) Last BM Date: 08/30/13  Intake/Output from previous day: 11/27 0701 - 11/28 0700 In: 1492 [P.O.:705; I.V.:787] Out: -  Intake/Output this shift:    General appearance: alert. Good spirits. No distress. Mental status normal. GI: abdomen soft. Flat. Nontender. Perhaps slightly tympanitic in the upper abdomen. No mass. No scars. No hernias anywhere.  Lab Results:   Recent Labs  08/31/13 0520  WBC 7.1  HGB 14.9  HCT 41.0  PLT 256   BMET  Recent Labs  08/31/13 0520  NA 135  K 3.9  CL 102  CO2 25  GLUCOSE 83  BUN 9  CREATININE 1.00  CALCIUM 8.6   PT/INR No results found for this basename: LABPROT, INR,  in the last 72 hours ABG No results found for this basename: PHART, PCO2, PO2, HCO3,  in the last 72 hours  Studies/Results: Ct Entero Abd/pelvis W/cm  08/29/2013   CLINICAL DATA:  Diffuse abdominal pain. Vomiting. Small bowel obstruction. Evaluate for Crohn's disease.  EXAM: CT ABDOMEN AND PELVIS WITH CONTRAST (ENTEROGRAPHY)  TECHNIQUE: Multidetector CT of the abdomen and pelvis during bolus administration of intravenous contrast. Negative oral contrast VoLumen was given.  CONTRAST:  OMNIPAQUE IOHEXOL 300 MG/ML  SOLN  COMPARISON:  08/26/2013  FINDINGS: Moderately dilated small bowel loops are seen containing multiple air-fluid  levels, with a transition point to nondilated distal ileum seen in the right lower quadrant. This is consistent with a distal small bowel obstruction and is suspicious for an adhesion. Previously seen wall thickening of distal small bowel loops in the right lower quadrant is no longer visualized. There is no evidence of bowel wall thickening involving the terminal ileum. There is no evidence of abnormal mucosal enhancement or adjacent mesenteric inflammatory changes. No evidence of fistula or abscess.  Small hiatal hernia noted. The liver, gallbladder, pancreas, spleen, adrenal glands, and kidneys are normal in appearance. No soft tissue masses or lymphadenopathy identified within the abdomen or pelvis. No evidence of inflammatory process or abnormal fluid collections. Normal appendix visualized.  IMPRESSION: Distal small bowel obstruction, with transition point involving the distal ileum, suspicious for adhesion.  Resolution of distal small bowel wall thickening since previous study. No evidence of active inflammatory process or involvement of the terminal ileum.   Electronically Signed   By: Myles Rosenthal M.D.   On: 08/29/2013 20:14   Dg Abd 2 Views  08/31/2013   CLINICAL DATA:  Abdominal pain.  Bowel obstruction.  EXAM: ABDOMEN - 2 VIEW  COMPARISON:  CT 08/29/2013.  Abdominal series  08/29/2013 .  FINDINGS: Persistent prominent distention of small bowel noted. Small bowel distention has increased from 08/29/2013. Intraluminal air is noted within the colon. Residual contrast noted in the colon. No free air. Lung bases clear.  IMPRESSION: Interim increase in small bowel distention. Intraluminal  air remains in the colon. There is no free air. These findings are consistent with persistent small-bowel obstruction.   Electronically Signed   By: Maisie Fus  Register   On: 08/31/2013 07:45    Anti-infectives: Anti-infectives   Start     Dose/Rate Route Frequency Ordered Stop   08/30/13 1230  ciprofloxacin (CIPRO)  tablet 250 mg     250 mg Oral 2 times daily 08/30/13 1133 09/01/13 1959   08/28/13 1030  metroNIDAZOLE (FLAGYL) IVPB 500 mg  Status:  Discontinued     500 mg 100 mL/hr over 60 Minutes Intravenous Every 8 hours 08/28/13 1017 08/29/13 1115   08/27/13 1900  ciprofloxacin (CIPRO) IVPB 400 mg  Status:  Discontinued     400 mg 200 mL/hr over 60 Minutes Intravenous Every 12 hours 08/27/13 0805 08/29/13 1115   08/27/13 0700  ciprofloxacin (CIPRO) IVPB 200 mg  Status:  Discontinued     200 mg 100 mL/hr over 60 Minutes Intravenous Every 12 hours 08/27/13 0647 08/27/13 0805   08/26/13 1830  metroNIDAZOLE (FLAGYL) IVPB 500 mg     500 mg 100 mL/hr over 60 Minutes Intravenous  Once 08/26/13 1824 08/26/13 1933      Assessment/Plan:  Possible low-grade partial small bowel obstruction. Clinically he is not obstructed at this point in time, but it is concerning states he has never had an abdominal operation and the CT scan does suggest a transition point.  We'll allow full liquid diet at ad lib. Check x-rays tomorrow. If he continues to show signs of obstruction then diagnostic laparoscopy should be considered.   LOS: 5 days    Mackena Plummer M 08/31/2013

## 2013-08-31 NOTE — Progress Notes (Signed)
Family Medicine Teaching Service Daily Progress Note Intern Pager: [phone]  Patient name: Garrett Lin Medical record number: [id number] Date of birth: 12/13/1960 Age: 52 y.o. Gender: male  Primary Care Provider: Pcp Not In System Consultants: None Code Status: Full  Pt Overview and Major Events to Date:  11/23: CT concerning for SBO and ileitis; VSS; NPO;  D5 1/2 NS @ 125  11/26: advancing diet 11/27: Surgery consulted due to persistent stricture on CT 11/28: advanced to full liquid, x-ray tomorrow, possible diagnostic laparoscopy to be considered if no improvement  Assessment and Plan: Garrett Lin is a 52 y.o. male presenting with abdominal pain . PMH is significant for IBS.   # SBO  - CT shows evidence of small bowel obstruction with transition near the junction of jejunum and ileum that appears due to stricturing. No Hx of abdominal surgery. No hernia present on exam. Possible due to IBD see abdominal pain below. Surgery recommend advance diet to full liquid, abdominal x-ray tomorrow (11/29). If continued signs of obstruction, diagnostic laparoscopy to be considered. - per ED report, gen surg was consulted and stated nothing to do surgically at this time  - Advancing diet as tolerated - Zofran for nausea  - Dilaudid for pain control  - IVF: D5 1/2 NS @ 75  # Abdominal Pain  - Diffuse abdominal pain greatest in LLQ. Pt is Afebrile and VSS. Denies FHx of IBD. Reports Colonoscopy in his 31s w/ Polyps; C diff Neg & CRP: elevated 2.7.  - DDx: SBO vs IBS vs IBD vs Cdiff (Abx ~ 51mo ago for sinus infection) vs Gastroenteritis   # Right calf pain/tenderness: patient has been having crampy pain extending from right popliteal fossa to right calf. Patient has been on heparin for VTE prophylaxis and has recently started ambulating frequently - venous doppler, right lower extremity  # UTI - UA: Positive for Nitrites, Leuks (-), Small Hgb, Bili, and rare Bacteria. Repeat UA (post abx)  not suggestive of infection - Afeb, No CVA tenderness - Cipro 250 PO BID (last day 11/29)  FEN/GI: D5 KVO; Diet full liquids  Prophylaxis: Heparin   Disposition: Discharge home pending surgery recs  Subjective:  Patient reports no emesis. Has been passing gas, but no bowel movements. Feels better overall and is eager to start full liquid diet.   Objective: Temp:  [97.5 F (36.4 C)-98 F (36.7 C)] 98 F (36.7 C) (11/28 0527) Pulse Rate:  [72-74] 72 (11/28 0527) Resp:  [16-20] 20 (11/28 0527) BP: (125-143)/(80-85) 143/85 mmHg (11/28 0527) SpO2:  [98 %-100 %] 100 % (11/28 0527)  Physical Exam: Gen: walking around the unit, pleasant, no acute distress CV: RRR, No m/r/g; No LE Edema  Lungs: CTAB; Normal Resp. Effort  Abdomen: Non-distended, non-tender, normal bowel sounds Extremities: Right calf is tender, non-erythematous and not swollen compared to right. Right leg measured approx 13 1/4 inches around calf vs 13 1/2 inches around calf.   Laboratory:  Recent Labs Lab 08/27/13 0315 08/28/13 0620 08/31/13 0520  WBC 6.3 5.3 7.1  HGB 16.4 14.7 14.9  HCT 45.1 41.3 41.0  PLT 272 238 256    Recent Labs Lab 08/26/13 1355 08/27/13 0315 08/28/13 0620 08/31/13 0520  NA 133* 134* 137 135  K 3.8 3.6 4.2 3.9  CL 95* 97 100 102  CO2 23 27 27 25   BUN 12 15 8 9   CREATININE 1.04 1.09 1.06 1.00  CALCIUM 9.4 8.9 8.9 8.6  PROT 7.6  --   --   --  BILITOT 1.1  --   --   --   ALKPHOS 97  --   --   --   ALT 24  --   --   --   AST 23  --   --   --   GLUCOSE 104* 111* 104* 83   Imaging/Diagnostic Tests:  Urinalysis    Component Value Date/Time   COLORURINE AMBER* 08/30/2013 1214   APPEARANCEUR CLEAR 08/30/2013 1214   LABSPEC 1.023 08/30/2013 1214   PHURINE 7.5 08/30/2013 1214   GLUCOSEU NEGATIVE 08/30/2013 1214   HGBUR NEGATIVE 08/30/2013 1214   BILIRUBINUR NEGATIVE 08/30/2013 1214   KETONESUR 15* 08/30/2013 1214   PROTEINUR NEGATIVE 08/30/2013 1214   UROBILINOGEN 1.0  08/30/2013 1214   NITRITE NEGATIVE 08/30/2013 1214   LEUKOCYTESUR NEGATIVE 08/30/2013 1214    Jacquelin Hawking, MD 08/31/2013, 8:41 AM PGY-1, [facility] FPTS Intern pager: [phone], text pages welcome

## 2013-08-31 NOTE — Progress Notes (Signed)
[facility] Progress Note  Subjective:  Feels ok.  Wants to figure out what is causing his bowel issues.  Also, continues to complain of right calf pain that he tells me has only been present for the past two days.  Objective:  Vital signs in last 24 hours: Temp:  [97.5 F (36.4 C)-98 F (36.7 C)] 98 F (36.7 C) (11/28 0527) Pulse Rate:  [72-74] 72 (11/28 0527) Resp:  [16-20] 20 (11/28 0527) BP: (125-143)/(80-85) 143/85 mmHg (11/28 0527) SpO2:  [98 %-100 %] 100 % (11/28 0527) Last BM Date: 08/30/13 General:  Alert, Well-developed, in NAD Heart:  Regular rate and rhythm; no murmurs Pulm:  CTAB.  No W/R/R. Abdomen:  Soft, non-distended.  BS present.  Mild right sided TTP without R/R/G. Extremities:  Without edema.  Right calf TTP.  No swelling or erythema. Neurologic:  Alert and  oriented x4;  grossly normal neurologically. Psych:  Alert and cooperative. Normal mood and affect.  Intake/Output from previous day: 11/27 0701 - 11/28 0700 In: 1492 [P.O.:705; I.V.:787] Out: -   Lab Results:  Recent Labs  08/31/13 0520  WBC 7.1  HGB 14.9  HCT 41.0  PLT 256   BMET  Recent Labs  08/31/13 0520  NA 135  K 3.9  CL 102  CO2 25  GLUCOSE 83  BUN 9  CREATININE 1.00  CALCIUM 8.6   Ct Entero Abd/pelvis W/cm  08/29/2013   CLINICAL DATA:  Diffuse abdominal pain. Vomiting. Small bowel obstruction. Evaluate for Crohn's disease.  EXAM: CT ABDOMEN AND PELVIS WITH CONTRAST (ENTEROGRAPHY)  TECHNIQUE: Multidetector CT of the abdomen and pelvis during bolus administration of intravenous contrast. Negative oral contrast VoLumen was given.  CONTRAST:  OMNIPAQUE IOHEXOL 300 MG/ML  SOLN  COMPARISON:  08/26/2013  FINDINGS: Moderately dilated small bowel loops are seen containing multiple air-fluid levels, with a transition point to nondilated distal ileum seen in the right lower quadrant. This is consistent with a distal small bowel obstruction and is suspicious for an  adhesion. Previously seen wall thickening of distal small bowel loops in the right lower quadrant is no longer visualized. There is no evidence of bowel wall thickening involving the terminal ileum. There is no evidence of abnormal mucosal enhancement or adjacent mesenteric inflammatory changes. No evidence of fistula or abscess.  Small hiatal hernia noted. The liver, gallbladder, pancreas, spleen, adrenal glands, and kidneys are normal in appearance. No soft tissue masses or lymphadenopathy identified within the abdomen or pelvis. No evidence of inflammatory process or abnormal fluid collections. Normal appendix visualized.  IMPRESSION: Distal small bowel obstruction, with transition point involving the distal ileum, suspicious for adhesion.  Resolution of distal small bowel wall thickening since previous study. No evidence of active inflammatory process or involvement of the terminal ileum.   Electronically Signed   By: Myles Rosenthal M.D.   On: 08/29/2013 20:14   Dg Abd 2 Views  08/31/2013   CLINICAL DATA:  Abdominal pain.  Bowel obstruction.  EXAM: ABDOMEN - 2 VIEW  COMPARISON:  CT 08/29/2013.  Abdominal series  08/29/2013 .  FINDINGS: Persistent prominent distention of small bowel noted. Small bowel distention has increased from 08/29/2013. Intraluminal air is noted within the colon. Residual contrast noted in the colon. No free air. Lung bases clear.  IMPRESSION: Interim increase in small bowel distention. Intraluminal air remains in the colon. There is no free air. These findings are consistent with persistent small-bowel obstruction.   Electronically Signed   By: Maisie Fus  Register  On: 08/31/2013 07:45    Assessment / Plan: *Persistent distal SBO confirmed by CT enterography (adhesion is suspicious source as there is no further bowel wall thickening or inflammatory process noted). Abdominal films today show persistent partial SBO.  Defer plans to surgery. No additional GI work up at this  time. *Persistent right calf pain:  Is on SQ heparin, but rule out DVT.    LOS: 5 days   ZEHR, JESSICA D.  08/31/2013, 10:28 AM  Pager number [phone]    Attending physician's note   I have taken an interval history, reviewed the chart and examined the patient. I agree with the Advanced Practitioner's note, impression and recommendations. Persistent partial SBO by abdominal films without significant GI complaints on clear liquids. No additional GI evaluation is planned at this time. He may need laparoscopy for suspected persistent partial SBO. Defer plans to surgery.  Venita Lick. Russella Dar, MD [facility]

## 2013-08-31 NOTE — Progress Notes (Signed)
VASCULAR LAB PRELIMINARY  PRELIMINARY  PRELIMINARY  PRELIMINARY  Right lower extremity venous duplex  completed.    Preliminary report: Right leg is negative for deep and superficial vein thrombosis.  Anureet Bruington, RVT 08/31/2013, 4:30 PM

## 2013-08-31 NOTE — Progress Notes (Signed)
FMTS Attending Admission Note: Garrett Centola,MD I  have seen and examined this patient, reviewed their chart. I have discussed this patient with the resident. I agree with the resident's findings, assessment and care plan.  

## 2013-09-01 ENCOUNTER — Inpatient Hospital Stay (HOSPITAL_COMMUNITY): Payer: BC Managed Care – PPO

## 2013-09-01 LAB — BASIC METABOLIC PANEL
BUN: 7 mg/dL (ref 6–23)
Calcium: 8.6 mg/dL (ref 8.4–10.5)
Creatinine, Ser: 0.98 mg/dL (ref 0.50–1.35)
GFR calc Af Amer: >90 mL/min (ref >90)
GFR calc non Af Amer: >90 mL/min (ref >90)

## 2013-09-01 LAB — CBC
HCT: 40.7 % (ref 39.0–52.0)
Hemoglobin: 14.5 g/dL (ref 13.0–17.0)
MCH: 31.9 pg (ref 26.0–34.0)
MCHC: 35.6 g/dL (ref 30.0–36.0)
MCV: 89.5 fL (ref 78.0–100.0)
RDW: 11.8 % (ref 11.5–15.5)

## 2013-09-01 LAB — SURGICAL PCR SCREEN: Staphylococcus aureus: NEGATIVE

## 2013-09-01 MED ORDER — CHLORHEXIDINE GLUCONATE 4 % EX LIQD
1.0000 "application " | Freq: Once | CUTANEOUS | Status: AC
  Administered 2013-09-02: 1 via TOPICAL
  Filled 2013-09-01: qty 15

## 2013-09-01 MED ORDER — CEFAZOLIN SODIUM-DEXTROSE 2-3 GM-% IV SOLR
2.0000 g | INTRAVENOUS | Status: AC
  Administered 2013-09-02: 2 g via INTRAVENOUS
  Filled 2013-09-01: qty 50

## 2013-09-01 NOTE — Progress Notes (Signed)
Family Medicine Teaching Service Daily Progress Note Intern Pager: [phone]  Patient name: Garrett Lin Medical record number: [id number] Date of birth: 1961/04/15 Age: 52 y.o. Gender: male  Primary Care Provider: Pcp Not In System Consultants: None Code Status: Full  Pt Overview and Major Events to Date:  11/23: CT concerning for SBO and ileitis; VSS; NPO;  D5 1/2 NS @ 125  11/26: advancing diet 11/27: Surgery consulted due to persistent stricture on CT 11/28: advanced to full liquid, x-ray tomorrow, possible diagnostic laparoscopy to be considered if no improvement  Assessment and Plan: Garrett Lin is a 52 y.o. male presenting with abdominal pain . PMH is significant for IBS.   # SBO  - CT shows evidence of small bowel obstruction with transition near the junction of jejunum and ileum that appears due to stricturing. No Hx of abdominal surgery. No hernia present on exam. Possible due to IBD see abdominal pain below. Surgery   abdominal x-ray (11/29): Continues to show distention  Discussing diagnostic laparoscopy possibly tomorrow - NPO after midnight - Zofran for nausea; PPI - IVF: D5 1/2 NS @ 100  # Abdominal Pain: Resolved  - Diffuse abdominal pain greatest in LLQ. Pt is Afebrile and VSS. Denies FHx of IBD. Reports Colonoscopy in his 53s w/ Polyps; C diff Neg & CRP: elevated 2.7.  - DDx: SBO vs IBS vs IBD vs Cdiff (Abx ~ 20mo ago for sinus infection) vs Gastroenteritis   # Right calf pain/tenderness: patient has been having crampy pain extending from right popliteal fossa to right calf. Patient has been on heparin for VTE prophylaxis and has recently started ambulating frequently - venous doppler: Neg for DVTs - Pain improved   # UTI - UA: Positive for Nitrites, Leuks (-), Small Hgb, Bili, and rare Bacteria. Repeat UA (post abx) not suggestive of infection - Afeb, No CVA tenderness - Cipro end today  FEN/GI: MIVF @ 100 ; Diet full liquids then NPO after  midnight Prophylaxis: Heparin   Disposition: Discharge home pending surgery recs  Subjective:  No abdominal pain, and has been passing gas. Feels better overall, but wants to discuss surgery with wife to prevent future episodes.    Objective: Temp:  [98 F (36.7 C)-98.3 F (36.8 C)] 98.2 F (36.8 C) (11/29 0559) Pulse Rate:  [73-76] 73 (11/29 0559) Resp:  [20] 20 (11/29 0559) BP: (116-127)/(78-88) 127/78 mmHg (11/29 0559) SpO2:  [99 %-100 %] 100 % (11/29 0559)  Physical Exam: Gen: walking around the unit, pleasant, no acute distress CV: RRR, No m/r/g; No LE Edema  Lungs: CTAB; Normal Resp. Effort  Abdomen: Non-distended, non-tender, normal bowel sounds Extremities: Right calf is mildly tender, non-erythematous and not swollen compared to right. Laboratory:  Recent Labs Lab 08/28/13 0620 08/31/13 0520 09/01/13 0355  WBC 5.3 7.1 6.5  HGB 14.7 14.9 14.5  HCT 41.3 41.0 40.7  PLT 238 256 270    Recent Labs Lab 08/26/13 1355  08/28/13 0620 08/31/13 0520 09/01/13 0355  NA 133*  < > 137 135 134*  K 3.8  < > 4.2 3.9 3.5  CL 95*  < > 100 102 99  CO2 23  < > 27 25 25   BUN 12  < > 8 9 7   CREATININE 1.04  < > 1.06 1.00 0.98  CALCIUM 9.4  < > 8.9 8.6 8.6  PROT 7.6  --   --   --   --   BILITOT 1.1  --   --   --   --  ALKPHOS 97  --   --   --   --   ALT 24  --   --   --   --   AST 23  --   --   --   --   GLUCOSE 104*  < > 104* 83 103*  < > = values in this interval not displayed. Imaging/Diagnostic Tests:  Urinalysis    Component Value Date/Time   COLORURINE AMBER* 08/30/2013 1214   APPEARANCEUR CLEAR 08/30/2013 1214   LABSPEC 1.023 08/30/2013 1214   PHURINE 7.5 08/30/2013 1214   GLUCOSEU NEGATIVE 08/30/2013 1214   HGBUR NEGATIVE 08/30/2013 1214   BILIRUBINUR NEGATIVE 08/30/2013 1214   KETONESUR 15* 08/30/2013 1214   PROTEINUR NEGATIVE 08/30/2013 1214   UROBILINOGEN 1.0 08/30/2013 1214   NITRITE NEGATIVE 08/30/2013 1214   LEUKOCYTESUR NEGATIVE 08/30/2013 1214     Wenda Low, MD 09/01/2013, 8:51 AM PGY-1, [facility] FPTS Intern pager: [phone], text pages welcome

## 2013-09-01 NOTE — Progress Notes (Signed)
General surgery attending:  I had a nice long talk with the patient and his wife. I went over his symptomatology, the radiographic evaluation to date, and the differential diagnosis once again.  I discussed the indications, details, techniques, and numerous risks of laparoscopy, small bowel resection, possible laparotomy with them. All of their questions major. This understands all these issues. They agree with this plan.  We will plan to go ahead with surgery tomorrow morning.  Angelia Mould. Derrell Lolling, M.D., [facility] General and Minimally invasive Surgery Breast and Colorectal Surgery Office:   [phone] Pager:   [phone]

## 2013-09-01 NOTE — Progress Notes (Addendum)
Subjective: Patient states he is tolerating full liquids. Passing lots of flatus but no stool in the last 24 hours. He had 2 bowel movements the day before. Denies cramps or nausea. Denies abdominal distention or bloating.  Abdominal x-rays this morning shows some decrease in the number and degree of distention of small bowel but there is still a few loops of abnormally distended small bowel in the left upper quadrant. There is a lot of air in the colon. Laboratory this morning is normal.  Objective: Vital signs in last 24 hours: Temp:  [98 F (36.7 C)-98.3 F (36.8 C)] 98.2 F (36.8 C) 09/05/23 0559) Pulse Rate:  [73-76] 73 September 05, 2023 0559) Resp:  [20] 20 Sep 05, 2023 0559) BP: (116-127)/(78-88) 127/78 mmHg 09/05/2023 0559) SpO2:  [99 %-100 %] 100 % 09-05-23 0559) Last BM Date: 08/30/13  Intake/Output from previous day: 11/28 0701 - 09/05/23 0700 In: 2657.4 [P.O.:1110; I.V.:1547.4] Out: 6 [Urine:6] Intake/Output this shift:    General appearance: alert. Friendly. cooperative. In no distress. GI: abdomen is soft. Not obviously distended. A little bit tympanitic on the left upper quadrant. No mass. No hernia. Normoactive bowel sounds.  Lab Results:   Recent Labs  08/31/13 0520 09/04/13 0355  WBC 7.1 6.5  HGB 14.9 14.5  HCT 41.0 40.7  PLT 256 270   BMET  Recent Labs  08/31/13 0520 Sep 04, 2013 0355  NA 135 134*  K 3.9 3.5  CL 102 99  CO2 25 25  GLUCOSE 83 103*  BUN 9 7  CREATININE 1.00 0.98  CALCIUM 8.6 8.6   PT/INR No results found for this basename: LABPROT, INR,  in the last 72 hours ABG No results found for this basename: PHART, PCO2, PO2, HCO3,  in the last 72 hours  Studies/Results: Dg Abd 2 Views  09-04-13   CLINICAL DATA:  Small bowel obstruction  EXAM: ABDOMEN - 2 VIEW  COMPARISON:  Answer previous  FINDINGS: A few distended small bowel loops, decreased in number and degree of distension since previous exam, with scattered fluid levels on the erect radiograph. Oral  contrast has progressed into the decompressed colon. No free air. Regional bones unremarkable. CONTRAST in the appendix.  IMPRESSION: 1. Partial decrease of small bowel dilatation with progression of oral contrast into the colon.   Electronically Signed   By: Oley Balm M.D.   On: 2013-09-04 08:03   Dg Abd 2 Views  08/31/2013   CLINICAL DATA:  Abdominal pain.  Bowel obstruction.  EXAM: ABDOMEN - 2 VIEW  COMPARISON:  CT 08/29/2013.  Abdominal series  08/29/2013 .  FINDINGS: Persistent prominent distention of small bowel noted. Small bowel distention has increased from 08/29/2013. Intraluminal air is noted within the colon. Residual contrast noted in the colon. No free air. Lung bases clear.  IMPRESSION: Interim increase in small bowel distention. Intraluminal air remains in the colon. There is no free air. These findings are consistent with persistent small-bowel obstruction.   Electronically Signed   By: Maisie Fus  Register   On: 08/31/2013 07:45    Anti-infectives: Anti-infectives   Start     Dose/Rate Route Frequency Ordered Stop   08/30/13 1230  ciprofloxacin (CIPRO) tablet 250 mg     250 mg Oral 2 times daily 08/30/13 1133 04-Sep-2013 0824   08/28/13 1030  metroNIDAZOLE (FLAGYL) IVPB 500 mg  Status:  Discontinued     500 mg 100 mL/hr over 60 Minutes Intravenous Every 8 hours 08/28/13 1017 08/29/13 1115   08/27/13 1900  ciprofloxacin (CIPRO) IVPB 400  mg  Status:  Discontinued     400 mg 200 mL/hr over 60 Minutes Intravenous Every 12 hours 08/27/13 0805 08/29/13 1115   08/27/13 0700  ciprofloxacin (CIPRO) IVPB 200 mg  Status:  Discontinued     200 mg 100 mL/hr over 60 Minutes Intravenous Every 12 hours 08/27/13 0647 08/27/13 0805   08/26/13 1830  metroNIDAZOLE (FLAGYL) IVPB 500 mg     500 mg 100 mL/hr over 60 Minutes Intravenous  Once 08/26/13 1824 08/26/13 1933      Assessment/Plan:  Possible low-grade distal, partial small bowel obstruction. Differential diagnosis includes primary  adhesion, internal hernia, inflammatory bowel disease, and less likely neoplasia.  GI States that there is no indication for any further GI evaluation.  I have discussed the differential diagnosis with him.I have discussed the findings on both CT scans with him. I have told him that most likely this is a fixed problem and will persist. although I do not know for sure.  There is a small possibility that there is no pathology whatsoever.  I have told him that it is my opinion that we should proceed with a diagnostic laparoscopy, possible laparotomy, possible bowel resection tomorrow, since he has already eaten  breakfast. Alternatively, we could go ahead with a regular diet to see if he becomes symptomatic or not.He could then be discharged home and consider capsule endoscopy at later date, although apparently GI does not feel this is indicated. I have told him that I feel that this more than  likely will recur. He is aware of the differential diagnosis and the uncertainty. He is leaning toward the laparoscopy tomorrow because he is tired of this happening, since it has been a recurring problem to some degree. He's going to call his wife in for further discussion.  For now, I will simply make him n.p.o. after midnight and make decisions as we go along over the next 12-24 hours. Tentatively plan diagnostic laparoscopy tomorrow morning.    LOS: 6 days    Ajani Rineer M. Derrell Lolling, M.D., [facility] General and Minimally invasive Surgery Breast and Colorectal Surgery Office:   [phone] Pager:   [phone]  09/01/2013

## 2013-09-01 NOTE — Progress Notes (Addendum)
FMTS Attending Note  I personally saw and evaluated the patient. The plan of care was discussed with the resident team. I agree with the assessment and plan as documented by the resident.   Patient to have exploratory laparoscopic surgery to evaluate for cause of stricture.   Donnella Sham MD

## 2013-09-02 ENCOUNTER — Encounter (HOSPITAL_COMMUNITY): Payer: Self-pay | Admitting: Anesthesiology

## 2013-09-02 ENCOUNTER — Encounter (HOSPITAL_COMMUNITY)
Admission: EM | Disposition: A | Discharge status: Home / Self Care | Payer: Self-pay | Source: Home / Self Care | Attending: Family Medicine

## 2013-09-02 ENCOUNTER — Encounter (HOSPITAL_COMMUNITY): Payer: BC Managed Care – PPO | Admitting: Anesthesiology

## 2013-09-02 ENCOUNTER — Inpatient Hospital Stay (HOSPITAL_COMMUNITY): Payer: BC Managed Care – PPO | Admitting: Anesthesiology

## 2013-09-02 DIAGNOSIS — K565 Intestinal adhesions [bands], unspecified as to partial versus complete obstruction: Secondary | ICD-10-CM

## 2013-09-02 HISTORY — PX: LAPAROSCOPIC LYSIS OF ADHESIONS: SHX5905

## 2013-09-02 HISTORY — DX: Intestinal adhesions (bands), unspecified as to partial versus complete obstruction: K56.50

## 2013-09-02 LAB — BASIC METABOLIC PANEL
BUN: 6 mg/dL (ref 6–23)
CO2: 27 mEq/L (ref 19–32)
Chloride: 102 mEq/L (ref 96–112)
Creatinine, Ser: 1.01 mg/dL (ref 0.50–1.35)
GFR calc Af Amer: >90 mL/min (ref >90)
Glucose, Bld: 101 mg/dL — ABNORMAL HIGH (ref 70–99)
Sodium: 135 mEq/L (ref 135–145)

## 2013-09-02 LAB — CBC
HCT: 39.7 % (ref 39.0–52.0)
Hemoglobin: 13.6 g/dL (ref 13.0–17.0)
MCHC: 34.3 g/dL (ref 30.0–36.0)
MCV: 89.2 fL (ref 78.0–100.0)
RBC: 4.45 MIL/uL (ref 4.22–5.81)
RDW: 11.7 % (ref 11.5–15.5)
WBC: 5.5 10*3/uL (ref 4.0–10.5)

## 2013-09-02 SURGERY — LYSIS, ADHESIONS, LAPAROSCOPIC
Anesthesia: General | Wound class: Clean

## 2013-09-02 MED ORDER — FENTANYL CITRATE 0.05 MG/ML IJ SOLN
INTRAMUSCULAR | Status: DC | PRN
  Administered 2013-09-02: 350 ug via INTRAVENOUS
  Administered 2013-09-02: 150 ug via INTRAVENOUS

## 2013-09-02 MED ORDER — ONDANSETRON HCL 4 MG/2ML IJ SOLN: 4.0000 mg | Freq: Once | INTRAMUSCULAR | Status: DC | PRN

## 2013-09-02 MED ORDER — BUPIVACAINE-EPINEPHRINE 0.25% -1:200000 IJ SOLN
INTRAMUSCULAR | Status: DC | PRN
  Administered 2013-09-02: 15 mL

## 2013-09-02 MED ORDER — ROCURONIUM BROMIDE 100 MG/10ML IV SOLN
INTRAVENOUS | Status: DC | PRN
  Administered 2013-09-02: 40 mg via INTRAVENOUS

## 2013-09-02 MED ORDER — LIDOCAINE HCL 4 % MT SOLN
OROMUCOSAL | Status: DC | PRN
  Administered 2013-09-02: 4 mL via TOPICAL

## 2013-09-02 MED ORDER — POTASSIUM CHLORIDE IN NACL 20-0.9 MEQ/L-% IV SOLN
INTRAVENOUS | Status: DC
  Administered 2013-09-02 – 2013-09-03 (×2): via INTRAVENOUS
  Filled 2013-09-02 (×4): qty 1000

## 2013-09-02 MED ORDER — LIDOCAINE HCL (CARDIAC) 20 MG/ML IV SOLN
INTRAVENOUS | Status: DC | PRN
  Administered 2013-09-02: 100 mg via INTRAVENOUS

## 2013-09-02 MED ORDER — MIDAZOLAM HCL 5 MG/5ML IJ SOLN
INTRAMUSCULAR | Status: DC | PRN
  Administered 2013-09-02: 2 mg via INTRAVENOUS

## 2013-09-02 MED ORDER — BUPIVACAINE-EPINEPHRINE (PF) 0.25% -1:200000 IJ SOLN
INTRAMUSCULAR | Status: AC
  Filled 2013-09-02: qty 30

## 2013-09-02 MED ORDER — ONDANSETRON HCL 4 MG PO TABS: 4.0000 mg | ORAL_TABLET | Freq: Four times a day (QID) | ORAL | Status: DC | PRN

## 2013-09-02 MED ORDER — PROPOFOL 10 MG/ML IV BOLUS
INTRAVENOUS | Status: DC | PRN
  Administered 2013-09-02: 200 mg via INTRAVENOUS

## 2013-09-02 MED ORDER — ONDANSETRON HCL 4 MG/2ML IJ SOLN
INTRAMUSCULAR | Status: DC | PRN
  Administered 2013-09-02: 4 mg via INTRAVENOUS

## 2013-09-02 MED ORDER — LACTATED RINGERS IV SOLN
INTRAVENOUS | Status: DC | PRN
  Administered 2013-09-02 (×2): via INTRAVENOUS

## 2013-09-02 MED ORDER — OXYCODONE-ACETAMINOPHEN 5-325 MG PO TABS
1.0000 | ORAL_TABLET | ORAL | Status: DC | PRN
  Administered 2013-09-02 – 2013-09-04 (×8): 2 via ORAL
  Filled 2013-09-02 (×8): qty 2

## 2013-09-02 MED ORDER — NEOSTIGMINE METHYLSULFATE 1 MG/ML IJ SOLN
INTRAMUSCULAR | Status: DC | PRN
  Administered 2013-09-02: 4 mg via INTRAVENOUS

## 2013-09-02 MED ORDER — GLYCOPYRROLATE 0.2 MG/ML IJ SOLN
INTRAMUSCULAR | Status: DC | PRN
  Administered 2013-09-02: .7 mg via INTRAVENOUS

## 2013-09-02 MED ORDER — HYDROMORPHONE HCL PF 1 MG/ML IJ SOLN: 0.2500 mg | INTRAMUSCULAR | Status: DC | PRN

## 2013-09-02 MED ORDER — FENTANYL CITRATE 0.05 MG/ML IJ SOLN
25.0000 ug | INTRAMUSCULAR | Status: DC | PRN
  Administered 2013-09-02 – 2013-09-03 (×3): 25 ug via INTRAVENOUS
  Filled 2013-09-02 (×3): qty 2

## 2013-09-02 MED ORDER — ONDANSETRON HCL 4 MG/2ML IJ SOLN: 4.0000 mg | Freq: Four times a day (QID) | INTRAMUSCULAR | Status: DC | PRN

## 2013-09-02 MED ORDER — ENOXAPARIN SODIUM 40 MG/0.4ML ~~LOC~~ SOLN
40.0000 mg | SUBCUTANEOUS | Status: DC
  Administered 2013-09-03 – 2013-09-04 (×2): 40 mg via SUBCUTANEOUS
  Filled 2013-09-02 (×3): qty 0.4

## 2013-09-02 SURGICAL SUPPLY — 27 items
ADH SKN CLS APL DERMABOND .7 (GAUZE/BANDAGES/DRESSINGS) ×1
BLADE SURG ROTATE 9660 (MISCELLANEOUS) ×2 IMPLANT
CHLORAPREP W/TINT 26ML (MISCELLANEOUS) ×2 IMPLANT
COVER SURGICAL LIGHT HANDLE (MISCELLANEOUS) ×2 IMPLANT
DERMABOND ADVANCED (GAUZE/BANDAGES/DRESSINGS) ×1
DERMABOND ADVANCED .7 DNX12 (GAUZE/BANDAGES/DRESSINGS) ×1 IMPLANT
DRAPE LAPAROSCOPIC ABDOMINAL (DRAPES) ×2 IMPLANT
DRAPE UTILITY 15X26 W/TAPE STR (DRAPE) ×2 IMPLANT
ELECT REM PT RETURN 9FT ADLT (ELECTROSURGICAL) ×2
ELECTRODE REM PT RTRN 9FT ADLT (ELECTROSURGICAL) ×1 IMPLANT
GLOVE BIO SURGEON STRL SZ 6.5 (GLOVE) ×2 IMPLANT
GLOVE BIO SURGEON STRL SZ7 (GLOVE) ×2 IMPLANT
GLOVE EUDERMIC 7 POWDERFREE (GLOVE) ×2 IMPLANT
GOWN STRL NON-REIN LRG LVL3 (GOWN DISPOSABLE) ×4 IMPLANT
GOWN STRL REIN XL XLG (GOWN DISPOSABLE) ×2 IMPLANT
KIT BASIN OR (CUSTOM PROCEDURE TRAY) ×2 IMPLANT
KIT ROOM TURNOVER OR (KITS) ×2 IMPLANT
PENCIL BUTTON HOLSTER BLD 10FT (ELECTRODE) ×2 IMPLANT
SCISSORS LAP 5X35 DISP (ENDOMECHANICALS) ×2 IMPLANT
SET IRRIG TUBING LAPAROSCOPIC (IRRIGATION / IRRIGATOR) ×2 IMPLANT
SLEEVE ENDOPATH XCEL 5M (ENDOMECHANICALS) ×8 IMPLANT
TOWEL OR 17X24 6PK STRL BLUE (TOWEL DISPOSABLE) ×2 IMPLANT
TOWEL OR 17X26 10 PK STRL BLUE (TOWEL DISPOSABLE) ×2 IMPLANT
TRAY LAPAROSCOPIC (CUSTOM PROCEDURE TRAY) ×2 IMPLANT
TROCAR XCEL BLUNT TIP 100MML (ENDOMECHANICALS) ×2 IMPLANT
TROCAR XCEL NON-BLD 5MMX100MML (ENDOMECHANICALS) ×2 IMPLANT
TUBING FILTER THERMOFLATOR (ELECTROSURGICAL) ×2 IMPLANT

## 2013-09-02 NOTE — Op Note (Signed)
Patient Name:           Garrett Lin   Date of Surgery:        09/02/2013  Pre op Diagnosis:      Partial small bowel obstruction of uncertain etiology  Post op Diagnosis:    Partial small bowel obstruction, probably  secondary to adhesions  Procedure:                 Diagnostic laparoscopy, laparoscopic lysis of adhesions  Surgeon:                     Angelia Mould. Derrell Lolling, M.D., FACS  Assistant:                      Emelia Loron, M.D., [facility]  Operative Indications:   This is a reasonably healthy 52 year old gentleman who was operated upon because of presumed low grade distal partial small bowel obstruction. He has never had an abdominal operation before.3 years ago he fell sustaining a severe injury to his arm and he also struck his right flank against a door. This was very painful. The arm required extensive surgery. The abdomen was never evaluated. His only abdominal complaints over the last 3 years is early satiety. He recently developed nausea and vomiting and was admitted to the hospital. Plain films and CT scan suggest a partial distal small bowel obstruction.. There was no tumor mass. No radiographic evidence of Crohn's disease. He was admitted and followed by surgery and gastroenterology for several days. His symptoms abated but his plain films still showed small bowel distention. A second CAT scan was done and he vomited after drinking the CT contrast.Radiologically they thought that there was a transition point between the junction of the jejunum and ileum. Possible inflammatory change was commented upon.  He became asymptomatic thereafter and resume diet and his abdominal x-rays continued to show some small bowel distention and his abdominal exam revealed a little bit of distention although he had no pain or nausea or vomiting. Differential diagnosis was discussed with him. Several options were outlined. He elected to go ahead with diagnostic laparoscopy.  Operative Findings:        There were some chronic adhesions of the right colon to the anterior bowel wall in the right lateral abdominal wall. These were not anatomic. I took all these adhesions down. I could identify the right colon, appendix, and terminal ileum ileocecal valve. The terminal ileum was minimally distended and then as I examined the small bowel proximally a gradually became more distended and then as I got back up into the proximal jejunum it became of normal caliber again. I ran the small bowel from the ligament of Treitz all the way to the ileocecal valve and found no evidence of Crohn's disease or tumor. There is no particular stricture anywhere. The appendix looked normal. There was no sign of any tumor. The transverse colon looked normal. The liver gallbladder stomach looked normal. The only abnormal finding really was some small bowel distention without a specific obstructing point and chronic adhesions to the right abdominal wall from the right colon of unknown etiology.   Procedure in Detail:  following the induction of general endotracheal anesthesia the abdomen was prepped and draped in a sterile fashion. Intravenous antibiotics were given. Surgical time out performed. 0.5% Marcaine with epinephrine was used for local infiltration anesthetic. A short vertical incision was made in the midline just above the umbilicus. The fascia was  incised and the abdominal cavity entered under vision. An 11 mm Hassan trocar was inserted and secured with a pursestring suture of 0 Vicryl. Pneumoperitoneum was created. Camera was inserted with visualization and findings as  Above.  I placed a few other 5 mm trocars and used a 5 mm angled scope. With sharp scissor dissection I down all the adhesions from the omentum and right colon to the anterior abdominal wall. I made sure there was no tethering or kinking of the colon and there did not appear to be. As I went retrograde I could identify the appendix and the ileocecal valve and  the terminal ileum. I ran the small bowel twice from the ileocecal valve all the back to ligament of Treitz and then back down again with findings as described above. There was some distention of the mid small bowel but gradual transition and no stricture or fixed obstructing point. Dr. Dwain Sarna and I considered our options at this point and felt that we had done all we should do. We feel that if he develops further symptoms then colonoscopy with ileoscopy and  capsule endoscopy to be done. We checked for bleeding there was none. We checked for injury and found only a very tiny superficial serosal tear of the terminal ileum that did not require repair. There was no bleeding. The trocars were removed. The pneumoperitoneum was released. The fascia at the umbilicus was closed with 0 Vicryl sutures and skin closed with subcuticular sutures of 4-0 Monocryl and Dermabond. The patient tolerated she will take to the recovery room in stable condition. EBL 15 cc. Counts correct. Complications none.               Angelia Mould. Derrell Lolling, M.D., FACS General and Minimally Invasive Surgery Breast and Colorectal Surgery  09/02/2013 2:22 PM

## 2013-09-02 NOTE — Anesthesia Postprocedure Evaluation (Signed)
  Anesthesia Post-op Note  Patient: Garrett Lin  Procedure(s) Performed: Procedure(s): LAPAROSCOPIC LYSIS OF ADHESIONS  (N/A)  Patient Location: PACU  Anesthesia Type:General  Level of Consciousness: awake, oriented, sedated and patient cooperative  Airway and Oxygen Therapy: Patient Spontanous Breathing  Post-op Pain: mild  Post-op Assessment: Post-op Vital signs reviewed, Patient's Cardiovascular Status Stable, Respiratory Function Stable, Patent Airway, No signs of Nausea or vomiting and Pain level controlled  Post-op Vital Signs: stable  Complications: No apparent anesthesia complications

## 2013-09-02 NOTE — Progress Notes (Signed)
FMTS Attending Note  I personally saw and evaluated the patient. The plan of care was discussed with the resident team. I agree with the assessment and plan as documented by the resident.   Patient to have exploratory surgery today in attempt to identify a cause of his partial SBO.  Donnella Sham MD

## 2013-09-02 NOTE — Preoperative (Signed)
Beta Blockers   Reason not to administer Beta Blockers:Not Applicable 

## 2013-09-02 NOTE — Transfer of Care (Signed)
Immediate Anesthesia Transfer of Care Note  Patient: Garrett Lin  Procedure(s) Performed: Procedure(s): LAPAROSCOPIC LYSIS OF ADHESIONS  (N/A)  Patient Location: PACU  Anesthesia Type:General  Level of Consciousness: awake, alert  and oriented  Airway & Oxygen Therapy: Patient Spontanous Breathing and Patient connected to nasal cannula oxygen  Post-op Assessment: Report given to PACU RN and Post -op Vital signs reviewed and stable  Post vital signs: Reviewed and stable  Complications: No apparent anesthesia complications

## 2013-09-02 NOTE — H&P (View-Only) (Signed)
General surgery attending:  I had a nice long talk with the patient and his wife. I went over his symptomatology, the radiographic evaluation to date, and the differential diagnosis once again.  I discussed the indications, details, techniques, and numerous risks of laparoscopy, small bowel resection, possible laparotomy with them. All of their questions major. This understands all these issues. They agree with this plan.  We will plan to go ahead with surgery tomorrow morning.  Filipe Greathouse M. Nyeisha Goodall, M.D., FACS [facility] General and Minimally invasive Surgery Breast and Colorectal Surgery Office:   [phone] Pager:   [phone]  

## 2013-09-02 NOTE — Progress Notes (Signed)
Family Medicine Teaching Service Daily Progress Note Intern Pager: [phone]  Patient name: Garrett Lin Medical record number: [id number] Date of birth: [birth year] Age: 52 y.o. Gender: male  Primary Care Provider: Pcp Not In System Consultants: None Code Status: Full  Pt Overview and Major Events to Date:  11/23: CT concerning for SBO and ileitis; VSS; NPO;  D5 1/2 NS @ 125  11/26: advancing diet 11/27: Surgery consulted due to persistent stricture on CT 11/28: advanced to full liquid, x-ray tomorrow, possible diagnostic laparoscopy to be considered if no improvement  Assessment and Plan: Garrett Lin is a 52 y.o. male presenting with abdominal pain . PMH is significant for IBS.   # SBO  - CT shows evidence of small bowel obstruction with transition near the junction of jejunum and ileum that appears due to stricturing. No Hx of abdominal surgery. No hernia present on exam. Possible due to IBD see abdominal pain below - Gen Surg to OR today for Ex Laparoscopy.  Unclear etiology, concern for structural cause given frequent reoccurance - Low Risk for Operation given good functional class and no Cardiopulmonary Hx.  EKG pending - Zofran for nausea; PPI - IVF: D5 1/2 NS @ 100  # Abdominal Pain: Resolved  - Diffuse abdominal pain greatest in LLQ. Pt is Afebrile and VSS. Denies FHx of IBD. Reports Colonoscopy in his 67s w/ Polyps; C diff Neg & CRP: elevated 2.7.  - DDx: SBO vs IBS vs IBD vs Cdiff (Abx ~ 80mo ago for sinus infection) vs Gastroenteritis   # Right calf pain/tenderness: patient has been having crampy pain extending from right popliteal fossa to right calf. Patient has been on heparin for VTE prophylaxis and has recently started ambulating frequently - venous doppler - Pain improved   # UTI - UA: Positive for Nitrites, Leuks (-), Small Hgb, Bili, and rare Bacteria. Repeat UA (post abx) not suggestive of infection - Afeb, No CVA tenderness - Cipro end today  FEN/GI:  MIVF @ 100 ; Diet per Surgery following OR.  Prophylaxis: Heparin   Disposition:  Pending Dispo after Surgery today  Subjective:  No abdominal pain, and has been passing gas. Feels better overall.  Plans to OR today.  No Cardiopulmonary disease in past.  Good functional class.   Objective: Temp:  [98 F (36.7 C)-98.3 F (36.8 C)] 98.3 F (36.8 C) (11/30 0505) Pulse Rate:  [69-72] 72 (11/30 0505) Resp:  [18] 18 (11/30 0505) BP: (113-125)/(71-81) 125/81 mmHg (11/30 0505) SpO2:  [100 %] 100 % (11/30 0505)  Physical Exam: Gen: resting comfortably.  No acute distress.   CV: RRR, No m/r/g;  Lungs: CTAB; Normal Resp. Effort  Abdomen: Non-distended, non-tender, normal bowel sounds Extremities: No Edema, pulses 2+/4 B, no echymosis, mild R calf tenderness Laboratory:  Recent Labs Lab 08/31/13 0520 09/01/13 0355 09/02/13 0745  WBC 7.1 6.5 5.5  HGB 14.9 14.5 13.6  HCT 41.0 40.7 39.7  PLT 256 270 255    Recent Labs Lab 08/26/13 1355  08/31/13 0520 09/01/13 0355 09/02/13 0745  NA 133*  < > 135 134* 135  K 3.8  < > 3.9 3.5 4.6  CL 95*  < > 102 99 102  CO2 23  < > 25 25 27   BUN 12  < > 9 7 6   CREATININE 1.04  < > 1.00 0.98 1.01  CALCIUM 9.4  < > 8.6 8.6 8.9  PROT 7.6  --   --   --   --  BILITOT 1.1  --   --   --   --   ALKPHOS 97  --   --   --   --   ALT 24  --   --   --   --   AST 23  --   --   --   --   GLUCOSE 104*  < > 83 103* 101*  < > = values in this interval not displayed. Imaging/Diagnostic Tests:  Urinalysis    Component Value Date/Time   COLORURINE AMBER* 08/30/2013 1214   APPEARANCEUR CLEAR 08/30/2013 1214   LABSPEC 1.023 08/30/2013 1214   PHURINE 7.5 08/30/2013 1214   GLUCOSEU NEGATIVE 08/30/2013 1214   HGBUR NEGATIVE 08/30/2013 1214   BILIRUBINUR NEGATIVE 08/30/2013 1214   KETONESUR 15* 08/30/2013 1214   PROTEINUR NEGATIVE 08/30/2013 1214   UROBILINOGEN 1.0 08/30/2013 1214   NITRITE NEGATIVE 08/30/2013 1214   LEUKOCYTESUR NEGATIVE 08/30/2013  1214    Andrena Mews, DO 09/02/2013, 9:48 AM PGY-3, [facility] FPTS Intern pager: [phone], text pages welcome

## 2013-09-02 NOTE — Anesthesia Preprocedure Evaluation (Signed)
Anesthesia Evaluation  Patient identified by MRN, date of birth, ID band Patient awake    Reviewed: Allergy & Precautions, H&P , NPO status   Airway       Dental   Pulmonary Current Smoker,          Cardiovascular     Neuro/Psych    GI/Hepatic   Endo/Other    Renal/GU      Musculoskeletal   Abdominal   Peds  Hematology   Anesthesia Other Findings IBS  Reproductive/Obstetrics                           Anesthesia Physical Anesthesia Plan  ASA: II  Anesthesia Plan: General   Post-op Pain Management:    Induction: Intravenous  Airway Management Planned: Oral ETT  Additional Equipment:   Intra-op Plan:   Post-operative Plan: Extubation in OR  Informed Consent: I have reviewed the patients History and Physical, chart, labs and discussed the procedure including the risks, benefits and alternatives for the proposed anesthesia with the patient or authorized representative who has indicated his/her understanding and acceptance.     Plan Discussed with:   Anesthesia Plan Comments:         Anesthesia Quick Evaluation

## 2013-09-02 NOTE — Interval H&P Note (Signed)
History and Physical Interval Note:  09/02/2013 11:39 AM  Garrett Lin  has presented today for surgery, with the diagnosis of /  The various methods of treatment have been discussed with the patient and family. After consideration of risks, benefits and other options for treatment, the patient has consented to  Procedure(s): LAPAROSCOPIC LYSIS OF ADHESIONS, POSSIBLE SMALL BOWEL RESECTION,  POSSIBLE OPEN (N/A) as a surgical intervention .  The patient's history has been reviewed, patient examined, no change in status, stable for surgery.  I have reviewed the patient's chart and labs.  Questions were answered to the patient's satisfaction.     [name]

## 2013-09-03 DIAGNOSIS — K565 Intestinal adhesions [bands], unspecified as to partial versus complete obstruction: Principal | ICD-10-CM

## 2013-09-03 LAB — CBC
Hemoglobin: 15.5 g/dL (ref 13.0–17.0)
MCH: 32 pg (ref 26.0–34.0)
MCHC: 35.3 g/dL (ref 30.0–36.0)
MCV: 90.7 fL (ref 78.0–100.0)
RBC: 4.84 MIL/uL (ref 4.22–5.81)
RDW: 11.8 % (ref 11.5–15.5)

## 2013-09-03 LAB — BASIC METABOLIC PANEL
CO2: 28 mEq/L (ref 19–32)
Calcium: 9.1 mg/dL (ref 8.4–10.5)
Creatinine, Ser: 1.06 mg/dL (ref 0.50–1.35)
Glucose, Bld: 89 mg/dL (ref 70–99)

## 2013-09-03 MED ORDER — PANTOPRAZOLE SODIUM 20 MG PO TBEC: 20.0000 mg | DELAYED_RELEASE_TABLET | Freq: Two times a day (BID) | ORAL | Status: DC

## 2013-09-03 NOTE — Progress Notes (Signed)
Family Medicine Teaching Service Daily Progress Note Intern Pager: [phone]  Patient name: Garrett Lin Medical record number: [id number] Date of birth: 11-10-1960 Age: 52 y.o. Gender: male  Primary Care Provider: Pcp Not In System Consultants: None Code Status: Full  Pt Overview and Major Events to Date:  11/23: CT concerning for SBO and ileitis; VSS; NPO;  D5 1/2 NS @ 125  11/26: advancing diet 11/27: Surgery consulted due to persistent stricture on CT 11/28: advanced to full liquid, x-ray tomorrow, possible diagnostic laparoscopy to be considered if no improvement  Assessment and Plan: Garrett Lin is a 52 y.o. male presenting with abdominal pain . PMH is significant for IBS.   # SBO/abdominal pain: s/p diagnostic lap yesterday 11/30 with findings of chronic adhesions of right colon s/p removal. No evidence of Crohn's, strictures or tumors. Minimal discomfort this morning, up and ambulating -gen surg following, apprec management and recs - Zofran for nausea; PPI - advance diet to blands this am -PO pain control with percocet  # Right calf pain/tenderness: patient has been having crampy pain extending from right popliteal fossa to right calf. Patient has been on heparin for VTE prophylaxis. Early ambulation s/p procedure yesterday. Pain nearly resolved this am - venous doppler neg for DVT -pain control as above  # UTI - UA: Positive for Nitrites, Leuks (-), Small Hgb, Bili, and rare Bacteria. Repeat UA (post abx) not suggestive of infection - Afeb, No CVA tenderness - completed cipro course yesterday  FEN/GI: KVO IVF today, bland diet Prophylaxis: Heparin   Disposition: pending toleration of bland diet, passing flatus, gen surg recs, possible d/c today  Subjective:  Ambulating well already. Minimal discomfort. No difficulties with urination. Has not passed flatus yet  Objective: Temp:  [97.6 F (36.4 C)-98.3 F (36.8 C)] 98.1 F (36.7 C) (11/30 2120) Pulse Rate:   [64-88] 82 (11/30 2120) Resp:  [12-18] 17 (11/30 2120) BP: (120-162)/(70-90) 120/86 mmHg (11/30 2120) SpO2:  [99 %-100 %] 100 % (11/30 2120)  Physical Exam: Gen: resting comfortably.  No acute distress.   CV: RRR, No m/r/g;  Lungs: CTAB; Normal Resp. Effort  Abdomen: Non-distended, normal bowel sounds, tender over port sites Extremities: No Edema, pulses 2+/4 B, no echymosis, no erythema  Laboratory:  Recent Labs Lab 08/31/13 0520 09/01/13 0355 09/02/13 0745  WBC 7.1 6.5 5.5  HGB 14.9 14.5 13.6  HCT 41.0 40.7 39.7  PLT 256 270 255    Recent Labs Lab 08/31/13 0520 09/01/13 0355 09/02/13 0745  NA 135 134* 135  K 3.9 3.5 4.6  CL 102 99 102  CO2 25 25 27   BUN 9 7 6   CREATININE 1.00 0.98 1.01  CALCIUM 8.6 8.6 8.9  GLUCOSE 83 103* 101*   Imaging/Diagnostic Tests:  Urinalysis    Component Value Date/Time   COLORURINE AMBER* 08/30/2013 1214   APPEARANCEUR CLEAR 08/30/2013 1214   LABSPEC 1.023 08/30/2013 1214   PHURINE 7.5 08/30/2013 1214   GLUCOSEU NEGATIVE 08/30/2013 1214   HGBUR NEGATIVE 08/30/2013 1214   BILIRUBINUR NEGATIVE 08/30/2013 1214   KETONESUR 15* 08/30/2013 1214   PROTEINUR NEGATIVE 08/30/2013 1214   UROBILINOGEN 1.0 08/30/2013 1214   NITRITE NEGATIVE 08/30/2013 1214   LEUKOCYTESUR NEGATIVE 08/30/2013 1214    Anselm Lis, MD 09/03/2013, 12:07 AM PGY-1, [facility] FPTS Intern pager: [phone], text pages welcome

## 2013-09-03 NOTE — Progress Notes (Signed)
FMTS Attending Daily Note:  Renold Don MD  [phone] pager  Family Practice pager:  [phone] I have seen and examined this patient and have reviewed their chart. I have discussed this patient with the resident. I agree with the resident's findings, assessment and care plan.  Additionally: Much improved.  Awaiting passage of flatus.  Once this has occurred and doing well on PO's, can DC home.  Tobey Grim, MD 09/03/2013

## 2013-09-03 NOTE — Progress Notes (Signed)
1 Day Post-Op   Assessment: s/p Procedure(s): LAPAROSCOPIC LYSIS OF ADHESIONS  Patient Active Problem List   Diagnosis Date Noted  . Small bowel obstruction due to adhesions 09/02/2013  . Unspecified intestinal obstruction 08/27/2013  . Abdominal pain, generalized 08/27/2013  . Nonspecific (abnormal) findings on radiological and other examination of gastrointestinal tract 08/27/2013    Stable post op  Plan: Seems to be tolerating diet but would not discharge until passing a fair amount of gas and had a BM  Subjective: Feels OK, ate well and no nausea or crampy abd pain. Not passed gas or had a BM yet  Objective: Vital signs in last 24 hours: Temp:  [97.6 F (36.4 C)-98.1 F (36.7 C)] 98.1 F (36.7 C) (12/01 1610) Pulse Rate:  [64-88] 70 (12/01 0638) Resp:  [12-19] 19 (12/01 0638) BP: (120-162)/(70-90) 136/82 mmHg (12/01 0638) SpO2:  [97 %-100 %] 97 % (12/01 0638)   Intake/Output from previous day: 11/30 0701 - 12/01 0700 In: 1400 [I.V.:1400] Out: 600 [Urine:600]  General appearance: alert, cooperative and no distress Resp: clear to auscultation bilaterally GI: Mildly distended, soft, tender at incisions  Incision: healing well  Lab Results:   Recent Labs  09/02/13 0745 09/03/13 0606  WBC 5.5 9.4  HGB 13.6 15.5  HCT 39.7 43.9  PLT 255 341   BMET  Recent Labs  09/02/13 0745 09/03/13 0606  NA 135 139  K 4.6 4.2  CL 102 103  CO2 27 28  GLUCOSE 101* 89  BUN 6 6  CREATININE 1.01 1.06  CALCIUM 8.9 9.1    MEDS, Scheduled . enoxaparin (LOVENOX) injection  40 mg Subcutaneous Q24H  . pantoprazole  20 mg Oral BID    Studies/Results: No results found.    LOS: 8 days     Currie Paris, MD, [facility], [place] [phone]   09/03/2013 11:09 AM

## 2013-09-04 ENCOUNTER — Encounter (HOSPITAL_COMMUNITY): Payer: Self-pay | Admitting: General Surgery

## 2013-09-04 LAB — BASIC METABOLIC PANEL
BUN: 8 mg/dL (ref 6–23)
CO2: 28 mEq/L (ref 19–32)
Calcium: 9.5 mg/dL (ref 8.4–10.5)
Creatinine, Ser: 1.01 mg/dL (ref 0.50–1.35)
GFR calc non Af Amer: 85 mL/min — ABNORMAL LOW (ref >90)
Sodium: 136 mEq/L (ref 135–145)

## 2013-09-04 LAB — CBC
MCH: 31.8 pg (ref 26.0–34.0)
MCHC: 34.9 g/dL (ref 30.0–36.0)
MCV: 91.1 fL (ref 78.0–100.0)
Platelets: 327 10*3/uL (ref 150–400)
RBC: 4.84 MIL/uL (ref 4.22–5.81)
RDW: 12 % (ref 11.5–15.5)

## 2013-09-04 MED ORDER — OXYCODONE-ACETAMINOPHEN 5-325 MG PO TABS: 1.0000 | ORAL_TABLET | ORAL | Status: DC | PRN

## 2013-09-04 MED ORDER — DOCUSATE SODIUM 100 MG PO CAPS
100.0000 mg | ORAL_CAPSULE | Freq: Two times a day (BID) | ORAL | Status: DC
  Administered 2013-09-04: 100 mg via ORAL
  Filled 2013-09-04: qty 1

## 2013-09-04 MED ORDER — DSS 100 MG PO CAPS: 100.0000 mg | ORAL_CAPSULE | Freq: Two times a day (BID) | ORAL | Status: DC

## 2013-09-04 NOTE — Progress Notes (Signed)
FMTS Attending Daily Note:  Renold Don MD  [phone] pager  Family Practice pager:  [phone] I have seen and examined this patient and have reviewed their chart. I have discussed this patient with the resident. I agree with the resident's findings, assessment and care plan.  Additionally:  Vastly improved.  Eating and with normal BM.  Ok to DC  Tobey Grim, MD 09/04/2013 6:21 PM

## 2013-09-04 NOTE — Progress Notes (Signed)
Family Medicine Teaching Service Daily Progress Note Intern Pager: [phone]  Patient name: Garrett Lin Medical record number: [id number] Date of birth: 1960/12/13 Age: 52 y.o. Gender: male  Primary Care Provider: Pcp Not In System Consultants: None Code Status: Full  Pt Overview and Major Events to Date:  11/23: CT concerning for SBO and ileitis; VSS; NPO;  D5 1/2 NS @ 125  11/26: advancing diet 11/27: Surgery consulted due to persistent stricture on CT 11/28: advanced to full liquid, x-ray tomorrow, possible diagnostic laparoscopy to be considered if no improvement  Assessment and Plan: Garrett Lin is a 52 y.o. male presenting with abdominal pain . PMH is significant for IBS.   # SBO/abdominal pain: s/p diagnostic lap 11/30 with findings of chronic adhesions of right colon s/p removal. POD#2. No evidence of Crohn's, strictures or tumors. Minimal discomfort this morning, up and ambulating. Has not had BM -gen surg following, apprec management and recs - Zofran for nausea; PPI - cont to advance diet as tolerated -PO pain control with percocet  # Right calf pain/tenderness: patient had having crampy pain extending from right popliteal fossa to right calf. Patient has been on heparin for VTE prophylaxis. Issue resolved this am - venous doppler neg for DVT -pain control as above  # UTI - UA: Positive for Nitrites, Leuks (-), Small Hgb, Bili, and rare Bacteria. Repeat UA (post abx) not suggestive of infection - Afeb, No CVA tenderness - completed cipro course yesterday  FEN/GI: KVO IVF today, bland diet Prophylaxis: Heparin   Disposition: pending toleration of bland diet, gen surg rec not d/c until has BM  Subjective:  Passed flatus yesterday (sm amt), tolerated diet yesterday  Objective: Temp:  [97.6 F (36.4 C)-98.1 F (36.7 C)] 97.6 F (36.4 C) (12/02 0530) Pulse Rate:  [64-82] 67 (12/02 0530) Resp:  [16-20] 20 (12/02 0530) BP: (124-135)/(77-83) 133/83 mmHg  (12/02 0530) SpO2:  [100 %] 100 % (12/02 0530)  Physical Exam: Gen: resting comfortably.  No acute distress.   CV: RRR, No m/r/g Lungs: CTAB; Normal Resp. Effort  Abdomen: Non-distended, normal bowel sounds, tender over port sites Extremities: No Edema, pulses 2+/4 B, no echymosis, no erythema  Laboratory:  Recent Labs Lab 09/02/13 0745 09/03/13 0606 09/04/13 0640  WBC 5.5 9.4 8.0  HGB 13.6 15.5 15.4  HCT 39.7 43.9 44.1  PLT 255 341 327    Recent Labs Lab 09/01/13 0355 09/02/13 0745 09/03/13 0606  NA 134* 135 139  K 3.5 4.6 4.2  CL 99 102 103  CO2 25 27 28   BUN 7 6 6   CREATININE 0.98 1.01 1.06  CALCIUM 8.6 8.9 9.1  GLUCOSE 103* 101* 89   Imaging/Diagnostic Tests:  Urinalysis    Component Value Date/Time   COLORURINE AMBER* 08/30/2013 1214   APPEARANCEUR CLEAR 08/30/2013 1214   LABSPEC 1.023 08/30/2013 1214   PHURINE 7.5 08/30/2013 1214   GLUCOSEU NEGATIVE 08/30/2013 1214   HGBUR NEGATIVE 08/30/2013 1214   BILIRUBINUR NEGATIVE 08/30/2013 1214   KETONESUR 15* 08/30/2013 1214   PROTEINUR NEGATIVE 08/30/2013 1214   UROBILINOGEN 1.0 08/30/2013 1214   NITRITE NEGATIVE 08/30/2013 1214   LEUKOCYTESUR NEGATIVE 08/30/2013 1214    Anselm Lis, MD 09/04/2013, 7:19 AM PGY-1,  Family Medicine FPTS Intern pager: [phone], text pages welcome

## 2013-09-04 NOTE — Progress Notes (Signed)
2 Days Post-Op  Subjective: Pt had a BM, passing flatus.  Tolerating diet.    Objective: Vital signs in last 24 hours: Temp:  [97.6 F (36.4 C)-98.1 F (36.7 C)] 97.6 F (36.4 C) (12/02 0530) Pulse Rate:  [64-82] 67 (12/02 0530) Resp:  [16-20] 20 (12/02 0530) BP: (124-135)/(77-83) 133/83 mmHg (12/02 0530) SpO2:  [100 %] 100 % (12/02 0530) Last BM Date: 08/30/13  Intake/Output from previous day: 12/01 0701 - 12/02 0700 In: 960 [P.O.:960] Out: 2 [Urine:2] Intake/Output this shift:    General appearance: alert, cooperative and no distress GI: bowel sounds are presents.  abdomen is soft, mildly distended.  incisions are c/d/i.    Lab Results:   Recent Labs  09/03/13 0606 09/04/13 0640  WBC 9.4 8.0  HGB 15.5 15.4  HCT 43.9 44.1  PLT 341 327   BMET  Recent Labs  09/03/13 0606 09/04/13 0640  NA 139 136  K 4.2 4.5  CL 103 98  CO2 28 28  GLUCOSE 89 93  BUN 6 8  CREATININE 1.06 1.01  CALCIUM 9.1 9.5   PT/INR No results found for this basename: LABPROT, INR,  in the last 72 hours ABG No results found for this basename: PHART, PCO2, PO2, HCO3,  in the last 72 hours  Studies/Results: No results found.  Anti-infectives: Anti-infectives   Start     Dose/Rate Route Frequency Ordered Stop   09/02/13 0600  ceFAZolin (ANCEF) IVPB 2 g/50 mL premix     2 g 100 mL/hr over 30 Minutes Intravenous On call to O.R. 09/01/13 1818 09/02/13 1253   08/30/13 1230  ciprofloxacin (CIPRO) tablet 250 mg     250 mg Oral 2 times daily 08/30/13 1133 09/01/13 0824   08/28/13 1030  metroNIDAZOLE (FLAGYL) IVPB 500 mg  Status:  Discontinued     500 mg 100 mL/hr over 60 Minutes Intravenous Every 8 hours 08/28/13 1017 08/29/13 1115   08/27/13 1900  ciprofloxacin (CIPRO) IVPB 400 mg  Status:  Discontinued     400 mg 200 mL/hr over 60 Minutes Intravenous Every 12 hours 08/27/13 0805 08/29/13 1115   08/27/13 0700  ciprofloxacin (CIPRO) IVPB 200 mg  Status:  Discontinued     200 mg 100  mL/hr over 60 Minutes Intravenous Every 12 hours 08/27/13 0647 08/27/13 0805   08/26/13 1830  metroNIDAZOLE (FLAGYL) IVPB 500 mg     500 mg 100 mL/hr over 60 Minutes Intravenous  Once 08/26/13 1824 08/26/13 1933      Assessment/Plan: s/p Procedure(s): LAPAROSCOPIC LYSIS OF ADHESIONS  (N/A)  POD#2 Tolerating diet, had a BM, pain well controlled.  He is stable for discharge from surgical standpoint.  He was return to work in 1 week.  He has a 20lb weight restriction for 2-3 weeks.  A follow up appt has been scheduled for the 19th at 0830. Post op instructions reviewed.     LOS: 9 days    Garrett Lin ANP-BC 09/04/2013 10:52 AM

## 2013-09-04 NOTE — Progress Notes (Signed)
Discharge home. Home discharge instruction given, no question verbalized. 

## 2013-09-04 NOTE — Progress Notes (Signed)
Agree with A&P of ER,ANP. Patient feels well and ready to go home

## 2013-09-06 NOTE — Discharge Summary (Signed)
Family Medicine Teaching Service  Discharge Note : Attending Jeff Imanie Darrow MD Pager [phone] Inpatient Team Pager:  [phone]  I have reviewed this patient and the patient's chart and have discussed discharge planning with the resident at the time of discharge. I agree with the discharge plan as above.    

## 2013-09-11 ENCOUNTER — Encounter: Payer: Self-pay | Admitting: Family Medicine

## 2013-09-11 ENCOUNTER — Ambulatory Visit (INDEPENDENT_AMBULATORY_CARE_PROVIDER_SITE_OTHER): Payer: BC Managed Care – PPO | Admitting: Family Medicine

## 2013-09-11 ENCOUNTER — Telehealth (INDEPENDENT_AMBULATORY_CARE_PROVIDER_SITE_OTHER): Payer: Self-pay | Admitting: General Surgery

## 2013-09-11 VITALS — BP 117/80 | HR 95 | Temp 97.5°F | Ht 72.0 in | Wt 150.0 lb

## 2013-09-11 DIAGNOSIS — R109 Unspecified abdominal pain: Secondary | ICD-10-CM

## 2013-09-11 MED ORDER — OXYCODONE-ACETAMINOPHEN 5-325 MG PO TABS: 1.0000 | ORAL_TABLET | ORAL | Status: DC | PRN

## 2013-09-11 NOTE — Telephone Encounter (Signed)
Pt of Dr. Derrell Lolling called to request more pain meds; was only given 10 and he took only 2 a day since surgery last Monday to make it last.  He understands this will be addressed by a physician later this afternoon, so in the meantime instructed to take 2 Aleve Q12H for his current discomfort.  Will take to Urgent office physician for resolution.

## 2013-09-11 NOTE — Patient Instructions (Signed)
Abdominal Pain °Many things can cause belly (abdominal) pain. Most times, the belly pain is not dangerous. The amount of belly pain does not tell how serious the problem may be. Many cases of belly pain can be watched and treated at home. °HOME CARE  °· Do not take medicines that help you go poop (laxatives) unless told to by your doctor. °· Only take medicine as told by your doctor. °· Eat or drink as told by your doctor. Your doctor will tell you if you should be on a special diet. °GET HELP RIGHT AWAY IF:  °· The pain does not go away. °· You have a fever. °· You keep throwing up (vomiting). °· The pain changes and is only in the right or left part of the belly. °· You have bloody or tarry looking poop. °MAKE SURE YOU:  °· Understand these instructions. °· Will watch your condition. °· Will get help right away if you are not doing well or get worse. °Document Released: 03/08/2008 Document Revised: 12/13/2011 Document Reviewed: 10/06/2009 °ExitCare® Patient Information ©2014 [employer]. ° °

## 2013-09-11 NOTE — Progress Notes (Signed)
   Subjective:    Patient ID: Garrett Lin, male    DOB: [birth year], 52 y.o.   MRN: [id number]  HPI  This 52 y.o. male presents for evaluation of follow up from surgery 09/02/13 for exp Lab due to SBO.  He has been taking percocet 2 a day for post operative pain.  He has Been passing gas and had BM this am.  He states he ate some suasage and eggs and Now has abdominal pain.  He called his surgeon and they referred him back to his PCP.  Review of Systems C/o abdominal pain   No chest pain, SOB, HA, dizziness, vision change, N/V, diarrhea, constipation, dysuria, urinary urgency or frequency, myalgias, arthralgias or rash.  Objective:   Physical Exam Vital signs noted  Well developed well nourished male.  HEENT - Head atraumatic Normocephalic                Eyes - PERRLA, Conjuctiva - clear Sclera- Clear EOMI                Ears - EAC's Wnl TM's Wnl Gross Hearing WNL                Throat - oropharanx wnl Respiratory - Lungs CTA bilateral Cardiac - RRR S1 and S2 without murmur GI - Abdomen soft Nontender and bowel sounds active x 4. Surgical scars on abdomen healing well. Extremities - No edema. Neuro - Grossly intact.      Assessment & Plan:  Abdominal  pain, other specified site Percocet 5/325 one po q 6 hours prn pain #20.  Need to follow up with surgery.  No Surgical abdomen seen.  He is probably having some post operative healing and  Do not see bowel obstruction or surgical abdomen.  Recommend eating soft bland Diet and work excuse given until next week.  Deatra Canter FNP

## 2013-09-11 NOTE — Telephone Encounter (Signed)
Pt stated he cannot take Hydrocodone without nausea and vomiting.

## 2013-09-12 NOTE — Telephone Encounter (Signed)
Pt called stating his PCP gave him RX for Oxycodone yesterday and will not need refill of pain medication. I advised pt I will send this msg to Dr Derrell Lolling. Pt to call with any concerns.

## 2013-09-12 NOTE — Telephone Encounter (Signed)
Please ask Dr Derrell Lolling today.

## 2013-09-21 ENCOUNTER — Ambulatory Visit (INDEPENDENT_AMBULATORY_CARE_PROVIDER_SITE_OTHER): Payer: BC Managed Care – PPO | Admitting: Surgery

## 2013-09-21 ENCOUNTER — Encounter (INDEPENDENT_AMBULATORY_CARE_PROVIDER_SITE_OTHER): Payer: Self-pay | Admitting: Surgery

## 2013-09-21 VITALS — BP 114/70 | HR 88 | Temp 98.9°F | Resp 15 | Ht 72.0 in | Wt 154.8 lb

## 2013-09-21 DIAGNOSIS — K565 Intestinal adhesions [bands], unspecified as to partial versus complete obstruction: Secondary | ICD-10-CM

## 2013-09-21 DIAGNOSIS — Z09 Encounter for follow-up examination after completed treatment for conditions other than malignant neoplasm: Secondary | ICD-10-CM

## 2013-09-21 NOTE — Patient Instructions (Signed)
We will see you again on an as needed basis. Please call the office at [phone] if you have any questions or concerns. Thank you for allowing us to take care of you.  

## 2013-09-21 NOTE — Progress Notes (Signed)
NAME: Garrett Lin                                            DOB: 12-29-1960 DATE: 09/21/2013                                                   MRN: [id number]  CC:  Chief Complaint  Patient presents with  . Routine Post Op    1st p/o lap lysis of adhesions    HPI: This patient comes in for post op follow-up .Heunderwent Laparoscopic LOA by Dr Derrell Lolling on 09/02/13. He feels that he is doing well. Eating normally and having regular BM's without difficulty.  PE:  VITAL SIGNS: BP 114/70  Pulse 88  Temp(Src) 98.9 F (37.2 C) (Temporal)  Resp 15  Ht 6' (1.829 m)  Wt 154 lb 12.8 oz (70.217 kg)  BMI 20.99 kg/m2  General: The patient appears to be healthy, NAD Abd: Soft and benign, not tender, all incisions healing nicel   IMPRESSION: The patient is doing well S/P Laparoscopic LOA.    PLAN: RTC PRN - but will check with Dr Derrell Lolling to see if anything else needed to be done.

## 2014-02-15 ENCOUNTER — Ambulatory Visit (INDEPENDENT_AMBULATORY_CARE_PROVIDER_SITE_OTHER): Payer: BC Managed Care – PPO | Admitting: Physician Assistant

## 2014-02-15 VITALS — BP 107/79 | HR 88 | Temp 98.3°F | Ht 72.0 in | Wt 151.0 lb

## 2014-02-15 DIAGNOSIS — W57XXXA Bitten or stung by nonvenomous insect and other nonvenomous arthropods, initial encounter: Secondary | ICD-10-CM

## 2014-02-15 DIAGNOSIS — M25519 Pain in unspecified shoulder: Secondary | ICD-10-CM

## 2014-02-15 DIAGNOSIS — T148 Other injury of unspecified body region: Secondary | ICD-10-CM

## 2014-02-15 MED ORDER — MELOXICAM 15 MG PO TABS: 15.0000 mg | ORAL_TABLET | Freq: Every day | ORAL | Status: DC

## 2014-02-15 NOTE — Progress Notes (Signed)
Subjective:     Patient ID: Garrett Lin, male   DOB: 25-Oct-1960, 53 y.o.   MRN: [id number]  HPI Pt seen as a [place] pt with R shoulder pain for several weeks Pt prev seen at the Urgent care for same He had Xray that were negative and told to F/U with Ortho Pt also with lesion to the R glut area States he has a hx of a mole to that area but it has become painful recently  Review of Systems Pain to the superior R shoulder Some radiation of sx to the lateral shoulder  No any numbness to the upper ext Hx of traumatic injury to the lower R arm    Objective:   Physical Exam NAD No ecchy/edema to the shoulder + TTP along the superior lateral shoulder ++ [facility] of the shoulder No sx with rest abd/adduction Surgical changes to the R forearm Good pulses distal Sensory good distal R glut show evidence of a very swollen tick No surrounding erythema/edema/induration of the bite site Area was cleansed Able to remove tick in entirety Area cleansed again    Assessment:     R shoulder pain R AC joint pain Tick Bite    Plan:     Mobic 15 mg 1 po qd #10 Hold OTC NSAIDS while taking meds Discussed if sx cont refer to Ortho Wound care reviewed for tick bite F/U prn

## 2014-02-15 NOTE — Patient Instructions (Signed)
Tick Bite Information Ticks are insects that attach themselves to the skin and draw blood for food. There are various types of ticks. Common types include wood ticks and deer ticks. Most ticks live in shrubs and grassy areas. Ticks can climb onto your body when you make contact with leaves or grass where the tick is waiting. The most common places on the body for ticks to attach themselves are the scalp, neck, armpits, waist, and groin. Most tick bites are harmless, but sometimes ticks carry germs that cause diseases. These germs can be spread to a person during the tick's feeding process. The chance of a disease spreading through a tick bite depends on:   The type of tick.  Time of year.   How long the tick is attached.   Geographic location.  HOW CAN YOU PREVENT TICK BITES? Take these steps to help prevent tick bites when you are outdoors:  Wear protective clothing. Long sleeves and long pants are best.   Wear white clothes so you can see ticks more easily.  Tuck your pant legs into your socks.   If walking on a trail, stay in the middle of the trail to avoid brushing against bushes.  Avoid walking through areas with long grass.  Put insect repellent on all exposed skin and along boot tops, pant legs, and sleeve cuffs.   Check clothing, hair, and skin repeatedly and before going inside.   Brush off any ticks that are not attached.  Take a shower or bath as soon as possible after being outdoors.  WHAT IS THE PROPER WAY TO REMOVE A TICK? Ticks should be removed as soon as possible to help prevent diseases caused by tick bites. 1. If latex gloves are available, put them on before trying to remove a tick.  2. Using fine-point tweezers, grasp the tick as close to the skin as possible. You may also use curved forceps or a tick removal tool. Grasp the tick as close to its head as possible. Avoid grasping the tick on its body. 3. Pull gently with steady upward pressure until  the tick lets go. Do not twist the tick or jerk it suddenly. This may break off the tick's head or mouth parts. 4. Do not squeeze or crush the tick's body. This could force disease-carrying fluids from the tick into your body.  5. After the tick is removed, wash the bite area and your hands with soap and water or other disinfectant such as alcohol. 6. Apply a small amount of antiseptic cream or ointment to the bite site.  7. Wash and disinfect any instruments that were used.  Do not try to remove a tick by applying a hot match, petroleum jelly, or fingernail polish to the tick. These methods do not work and may increase the chances of disease being spread from the tick bite.  WHEN SHOULD YOU SEEK MEDICAL CARE? Contact your health care provider if you are unable to remove a tick from your skin or if a part of the tick breaks off and is stuck in the skin.  After a tick bite, you need to be aware of signs and symptoms that could be related to diseases spread by ticks. Contact your health care provider if you develop any of the following in the days or weeks after the tick bite:  Unexplained fever.  Rash. A circular rash that appears days or weeks after the tick bite may indicate the possibility of Lyme disease. The rash may resemble   a target with a bull's-eye and may occur at a different part of your body than the tick bite.  Redness and swelling in the area of the tick bite.   Tender, swollen lymph glands.   Diarrhea.   Weight loss.   Cough.   Fatigue.   Muscle, joint, or bone pain.   Abdominal pain.   Headache.   Lethargy or a change in your level of consciousness.  Difficulty walking or moving your legs.   Numbness in the legs.   Paralysis.  Shortness of breath.   Confusion.   Repeated vomiting.  Document Released: 09/17/2000 Document Revised: 07/11/2013 Document Reviewed: 02/28/2013 ExitCare Patient Information 2014 [employer].  

## 2014-03-12 ENCOUNTER — Encounter: Payer: Self-pay | Admitting: Physician Assistant

## 2014-03-12 ENCOUNTER — Ambulatory Visit (INDEPENDENT_AMBULATORY_CARE_PROVIDER_SITE_OTHER): Payer: BC Managed Care – PPO | Admitting: Physician Assistant

## 2014-03-12 VITALS — BP 123/78 | HR 79 | Temp 98.3°F | Ht 72.0 in | Wt 158.6 lb

## 2014-03-12 DIAGNOSIS — M25511 Pain in right shoulder: Secondary | ICD-10-CM

## 2014-03-12 DIAGNOSIS — M25519 Pain in unspecified shoulder: Secondary | ICD-10-CM

## 2014-03-12 MED ORDER — TRAMADOL HCL 50 MG PO TABS: 50.0000 mg | ORAL_TABLET | Freq: Three times a day (TID) | ORAL | Status: DC | PRN

## 2014-03-12 NOTE — Progress Notes (Signed)
Subjective:     Patient ID: Garrett Lin, male   DOB: December 25, 1960, 53 y.o.   MRN: [id number]  HPI Pt with R shoulder pain States he feels like it locked the other night At that time had pain and numbness radiate all of the way down to the hand   Review of Systems Denies any continual weakness or numbness Mobic did help with some of the sx of pain No hx of surgery to the shoulder    Objective:   Physical Exam NAD + TTP over the [facility] joint and lateral shoulder FROM of the shoulder but increase in sx with rest abduction ant/lateral + crepitus at [facility] joint with ROM Good strength distal Pulses/sensory good    Assessment:     Shoulder pain    Plan:     Sounds like the shoulder may be subluxing Schedule MRI to assess shoulder stability Went over ways to minimize stress to the shoulder F/U pending MRI Ultram rx today #24 1 po qid prn 0RF

## 2014-03-12 NOTE — Patient Instructions (Signed)
   Shoulder Pain The shoulder is the joint that connects your arms to your body. The bones that form the shoulder joint include the upper arm bone (humerus), the shoulder blade (scapula), and the collarbone (clavicle). The top of the humerus is shaped like a ball and fits into a rather flat socket on the scapula (glenoid cavity). A combination of muscles and strong, fibrous tissues that connect muscles to bones (tendons) support your shoulder joint and hold the ball in the socket. Small, fluid-filled sacs (bursae) are located in different areas of the joint. They act as cushions between the bones and the overlying soft tissues and help reduce friction between the gliding tendons and the bone as you move your arm. Your shoulder joint allows a wide range of motion in your arm. This range of motion allows you to do things like scratch your back or throw a ball. However, this range of motion also makes your shoulder more prone to pain from overuse and injury. Causes of shoulder pain can originate from both injury and overuse and usually can be grouped in the following four categories:  Redness, swelling, and pain (inflammation) of the tendon (tendinitis) or the bursae (bursitis).  Instability, such as a dislocation of the joint.  Inflammation of the joint (arthritis).  Broken bone (fracture). HOME CARE INSTRUCTIONS   Apply ice to the sore area.  Put ice in a plastic bag.  Place a towel between your skin and the bag.  Leave the ice on for 15-20 minutes, 03-04 times per day for the first 2 days.  Stop using cold packs if they do not help with the pain.  If you have a shoulder sling or immobilizer, wear it as long as your caregiver instructs. Only remove it to shower or bathe. Move your arm as little as possible, but keep your hand moving to prevent swelling.  Squeeze a soft ball or foam pad as much as possible to help prevent swelling.  Only take over-the-counter or prescription medicines for  pain, discomfort, or fever as directed by your caregiver. SEEK MEDICAL CARE IF:   Your shoulder pain increases, or new pain develops in your arm, hand, or fingers.  Your hand or fingers become cold and numb.  Your pain is not relieved with medicines. SEEK IMMEDIATE MEDICAL CARE IF:   Your arm, hand, or fingers are numb or tingling.  Your arm, hand, or fingers are significantly swollen or turn white or blue. MAKE SURE YOU:   Understand these instructions.  Will watch your condition.  Will get help right away if you are not doing well or get worse. Document Released: 06/30/2005 Document Revised: 06/14/2012 Document Reviewed: 09/04/2011 ExitCare Patient Information 2014 [employer].  

## 2014-03-29 ENCOUNTER — Other Ambulatory Visit: Payer: Self-pay | Admitting: *Deleted

## 2014-03-29 DIAGNOSIS — M25511 Pain in right shoulder: Secondary | ICD-10-CM

## 2014-04-01 ENCOUNTER — Other Ambulatory Visit: Payer: Self-pay

## 2014-04-01 DIAGNOSIS — M25511 Pain in right shoulder: Secondary | ICD-10-CM

## 2014-04-03 HISTORY — PX: SHOULDER ARTHROSCOPY: SHX128

## 2014-04-08 ENCOUNTER — Ambulatory Visit (HOSPITAL_COMMUNITY): Payer: BC Managed Care – PPO

## 2014-04-25 ENCOUNTER — Ambulatory Visit: Payer: BC Managed Care – PPO | Attending: Orthopedic Surgery | Admitting: Physical Therapy

## 2014-04-25 DIAGNOSIS — Z9889 Other specified postprocedural states: Secondary | ICD-10-CM | POA: Diagnosis not present

## 2014-04-25 DIAGNOSIS — IMO0001 Reserved for inherently not codable concepts without codable children: Secondary | ICD-10-CM | POA: Diagnosis not present

## 2014-04-25 DIAGNOSIS — M25519 Pain in unspecified shoulder: Secondary | ICD-10-CM | POA: Diagnosis not present

## 2014-04-25 DIAGNOSIS — R5381 Other malaise: Secondary | ICD-10-CM | POA: Insufficient documentation

## 2014-04-25 DIAGNOSIS — M25619 Stiffness of unspecified shoulder, not elsewhere classified: Secondary | ICD-10-CM | POA: Diagnosis not present

## 2014-04-29 ENCOUNTER — Ambulatory Visit: Payer: BC Managed Care – PPO | Admitting: Physical Therapy

## 2014-04-29 DIAGNOSIS — IMO0001 Reserved for inherently not codable concepts without codable children: Secondary | ICD-10-CM | POA: Diagnosis not present

## 2014-05-01 ENCOUNTER — Ambulatory Visit: Payer: BC Managed Care – PPO | Admitting: Physical Therapy

## 2014-05-01 DIAGNOSIS — IMO0001 Reserved for inherently not codable concepts without codable children: Secondary | ICD-10-CM | POA: Diagnosis not present

## 2014-05-03 ENCOUNTER — Ambulatory Visit: Payer: BC Managed Care – PPO | Admitting: Physical Therapy

## 2014-05-03 DIAGNOSIS — IMO0001 Reserved for inherently not codable concepts without codable children: Secondary | ICD-10-CM | POA: Diagnosis not present

## 2014-05-06 ENCOUNTER — Encounter: Payer: BC Managed Care – PPO | Admitting: Physical Therapy

## 2014-05-07 ENCOUNTER — Ambulatory Visit: Payer: BC Managed Care – PPO | Attending: Orthopedic Surgery | Admitting: Physical Therapy

## 2014-05-07 ENCOUNTER — Ambulatory Visit: Payer: BC Managed Care – PPO | Admitting: Physical Therapy

## 2014-05-07 DIAGNOSIS — M25519 Pain in unspecified shoulder: Secondary | ICD-10-CM | POA: Insufficient documentation

## 2014-05-07 DIAGNOSIS — IMO0001 Reserved for inherently not codable concepts without codable children: Secondary | ICD-10-CM | POA: Insufficient documentation

## 2014-05-07 DIAGNOSIS — R5381 Other malaise: Secondary | ICD-10-CM | POA: Insufficient documentation

## 2014-05-07 DIAGNOSIS — Z9889 Other specified postprocedural states: Secondary | ICD-10-CM | POA: Insufficient documentation

## 2014-05-07 DIAGNOSIS — M25619 Stiffness of unspecified shoulder, not elsewhere classified: Secondary | ICD-10-CM | POA: Diagnosis not present

## 2014-05-08 ENCOUNTER — Ambulatory Visit: Payer: BC Managed Care – PPO | Admitting: Physical Therapy

## 2014-05-08 DIAGNOSIS — IMO0001 Reserved for inherently not codable concepts without codable children: Secondary | ICD-10-CM | POA: Diagnosis not present

## 2014-05-10 ENCOUNTER — Ambulatory Visit: Payer: BC Managed Care – PPO | Admitting: Physical Therapy

## 2014-05-10 DIAGNOSIS — IMO0001 Reserved for inherently not codable concepts without codable children: Secondary | ICD-10-CM | POA: Diagnosis not present

## 2014-05-13 ENCOUNTER — Ambulatory Visit: Payer: BC Managed Care – PPO | Admitting: Physical Therapy

## 2014-05-13 DIAGNOSIS — IMO0001 Reserved for inherently not codable concepts without codable children: Secondary | ICD-10-CM | POA: Diagnosis not present

## 2014-05-15 ENCOUNTER — Ambulatory Visit: Payer: BC Managed Care – PPO | Admitting: Physical Therapy

## 2014-05-15 DIAGNOSIS — IMO0001 Reserved for inherently not codable concepts without codable children: Secondary | ICD-10-CM | POA: Diagnosis not present

## 2014-05-17 ENCOUNTER — Ambulatory Visit: Payer: BC Managed Care – PPO | Admitting: Physical Therapy

## 2014-05-17 DIAGNOSIS — IMO0001 Reserved for inherently not codable concepts without codable children: Secondary | ICD-10-CM | POA: Diagnosis not present

## 2014-05-20 ENCOUNTER — Encounter: Payer: BC Managed Care – PPO | Admitting: Physical Therapy

## 2014-05-22 ENCOUNTER — Ambulatory Visit: Payer: BC Managed Care – PPO | Admitting: Physical Therapy

## 2014-05-22 DIAGNOSIS — IMO0001 Reserved for inherently not codable concepts without codable children: Secondary | ICD-10-CM | POA: Diagnosis not present

## 2014-05-24 ENCOUNTER — Ambulatory Visit: Payer: BC Managed Care – PPO | Admitting: Physical Therapy

## 2014-05-24 DIAGNOSIS — IMO0001 Reserved for inherently not codable concepts without codable children: Secondary | ICD-10-CM | POA: Diagnosis not present

## 2014-05-27 ENCOUNTER — Ambulatory Visit: Payer: BC Managed Care – PPO | Admitting: Physical Therapy

## 2014-05-27 DIAGNOSIS — IMO0001 Reserved for inherently not codable concepts without codable children: Secondary | ICD-10-CM | POA: Diagnosis not present

## 2014-05-29 ENCOUNTER — Ambulatory Visit: Payer: BC Managed Care – PPO | Admitting: Physical Therapy

## 2014-05-29 DIAGNOSIS — IMO0001 Reserved for inherently not codable concepts without codable children: Secondary | ICD-10-CM | POA: Diagnosis not present

## 2014-05-31 ENCOUNTER — Ambulatory Visit: Payer: BC Managed Care – PPO | Admitting: Physical Therapy

## 2014-05-31 DIAGNOSIS — IMO0001 Reserved for inherently not codable concepts without codable children: Secondary | ICD-10-CM | POA: Diagnosis not present

## 2014-06-03 ENCOUNTER — Ambulatory Visit: Payer: BC Managed Care – PPO | Admitting: Physical Therapy

## 2014-06-03 DIAGNOSIS — IMO0001 Reserved for inherently not codable concepts without codable children: Secondary | ICD-10-CM | POA: Diagnosis not present

## 2014-06-05 ENCOUNTER — Ambulatory Visit: Payer: BC Managed Care – PPO | Attending: Orthopedic Surgery | Admitting: Physical Therapy

## 2014-06-05 DIAGNOSIS — M25519 Pain in unspecified shoulder: Secondary | ICD-10-CM | POA: Diagnosis not present

## 2014-06-05 DIAGNOSIS — R5381 Other malaise: Secondary | ICD-10-CM | POA: Diagnosis not present

## 2014-06-05 DIAGNOSIS — M25619 Stiffness of unspecified shoulder, not elsewhere classified: Secondary | ICD-10-CM | POA: Insufficient documentation

## 2014-06-05 DIAGNOSIS — Z9889 Other specified postprocedural states: Secondary | ICD-10-CM | POA: Insufficient documentation

## 2014-06-05 DIAGNOSIS — IMO0001 Reserved for inherently not codable concepts without codable children: Secondary | ICD-10-CM | POA: Diagnosis not present

## 2014-06-06 ENCOUNTER — Ambulatory Visit: Payer: BC Managed Care – PPO | Admitting: Physical Therapy

## 2014-06-06 DIAGNOSIS — IMO0001 Reserved for inherently not codable concepts without codable children: Secondary | ICD-10-CM | POA: Diagnosis not present

## 2014-06-12 ENCOUNTER — Ambulatory Visit: Payer: BC Managed Care – PPO | Admitting: Physical Therapy

## 2014-06-12 DIAGNOSIS — IMO0001 Reserved for inherently not codable concepts without codable children: Secondary | ICD-10-CM | POA: Diagnosis not present

## 2014-06-14 ENCOUNTER — Ambulatory Visit: Payer: BC Managed Care – PPO | Admitting: Physical Therapy

## 2014-06-14 DIAGNOSIS — IMO0001 Reserved for inherently not codable concepts without codable children: Secondary | ICD-10-CM | POA: Diagnosis not present

## 2014-06-17 ENCOUNTER — Ambulatory Visit: Payer: BC Managed Care – PPO | Admitting: Physical Therapy

## 2014-06-17 DIAGNOSIS — IMO0001 Reserved for inherently not codable concepts without codable children: Secondary | ICD-10-CM | POA: Diagnosis not present

## 2014-06-19 ENCOUNTER — Ambulatory Visit: Payer: BC Managed Care – PPO | Admitting: Physical Therapy

## 2014-06-19 DIAGNOSIS — IMO0001 Reserved for inherently not codable concepts without codable children: Secondary | ICD-10-CM | POA: Diagnosis not present

## 2014-06-20 ENCOUNTER — Encounter: Payer: Self-pay | Admitting: Family Medicine

## 2014-06-20 ENCOUNTER — Ambulatory Visit (INDEPENDENT_AMBULATORY_CARE_PROVIDER_SITE_OTHER): Payer: BC Managed Care – PPO

## 2014-06-20 ENCOUNTER — Ambulatory Visit (INDEPENDENT_AMBULATORY_CARE_PROVIDER_SITE_OTHER): Payer: BC Managed Care – PPO | Admitting: Family Medicine

## 2014-06-20 VITALS — BP 133/90 | HR 83 | Temp 96.6°F | Ht 72.0 in | Wt 152.6 lb

## 2014-06-20 DIAGNOSIS — R109 Unspecified abdominal pain: Secondary | ICD-10-CM

## 2014-06-20 DIAGNOSIS — M25579 Pain in unspecified ankle and joints of unspecified foot: Secondary | ICD-10-CM

## 2014-06-20 DIAGNOSIS — S92911P Unspecified fracture of right toe(s), subsequent encounter for fracture with malunion: Secondary | ICD-10-CM

## 2014-06-20 DIAGNOSIS — M25571 Pain in right ankle and joints of right foot: Secondary | ICD-10-CM

## 2014-06-20 DIAGNOSIS — IMO0002 Reserved for concepts with insufficient information to code with codable children: Secondary | ICD-10-CM

## 2014-06-20 DIAGNOSIS — D72829 Elevated white blood cell count, unspecified: Secondary | ICD-10-CM

## 2014-06-20 LAB — POCT CBC
Granulocyte percent: 79.3 %G (ref 37–80)
HCT, POC: 48 % (ref 43.5–53.7)
Hemoglobin: 16.6 g/dL (ref 14.1–18.1)
Lymph, poc: 2.1 (ref 0.6–3.4)
MCH, POC: 32 pg — AB (ref 27–31.2)
MCHC: 34.7 g/dL (ref 31.8–35.4)
MCV: 92.2 fL (ref 80–97)
MPV: 6.7 fL (ref 0–99.8)
POC Granulocyte: 8.6 — AB (ref 2–6.9)
POC LYMPH PERCENT: 19.9 %L (ref 10–50)
Platelet Count, POC: 335 10*3/uL (ref 142–424)
RBC: 5.2 M/uL (ref 4.69–6.13)
RDW, POC: 11.6 %
WBC: 10.8 10*3/uL — AB (ref 4.6–10.2)

## 2014-06-20 MED ORDER — CIPROFLOXACIN HCL 500 MG PO TABS: 500.0000 mg | ORAL_TABLET | Freq: Two times a day (BID) | ORAL | Status: DC

## 2014-06-20 NOTE — Progress Notes (Signed)
   Subjective:    Patient ID: Garrett Lin, male    DOB: 02/12/1961, 53 y.o.   MRN: [id number]  HPI Patient is here for c/o abdominal pain, diarrhea, and painful right second toe.  He hit his toe this am and he is having deformity and some pain in his right second toe.  He was on oxycodone for 4 weeks for right shoulder pain and was abruptly stopped and placed on muscle relaxers and motrin.  He states he is going to get some more motrin otc.  He has hx of SBO and having exp lap surgery last year.   Review of Systems C/o right second toe pain and abdominal pain   No chest pain, SOB, HA, dizziness, vision change, N/V, diarrhea, constipation, dysuria, urinary urgency or frequency, myalgias, arthralgias or rash.  Objective:   Physical Exam  Vital signs noted  Well developed well nourished male.  HEENT - Head atraumatic Normocephalic Respiratory - Lungs CTA bilateral Cardiac - RRR S1 and S2 without murmur GI - Abdomen soft tender upper abdomen and bowel sounds active x 4 Extremities - No edema. MS - Second right toe deviated laterally and painful with palpation  Xray right foot - fractured and displaced right second toe. Prelimnary reading by Angeline Slim    Assessment & Plan:  Toe joint pain, right - Plan: DG Foot Complete Right, DG Abd 1 View, POCT CBC  Abdominal pain, unspecified site - Plan: DG Abd 1 View, POCT CBC, ciprofloxacin (CIPRO) 500 MG tablet  Fractured toe, right, with malunion, subsequent encounter - Plan: Ambulatory referral to Orthopedic Surgery  Leukocytosis, unspecified - Plan: ciprofloxacin (CIPRO) 500 MG tablet  Deatra Canter FNP

## 2014-06-21 ENCOUNTER — Ambulatory Visit: Payer: BC Managed Care – PPO | Admitting: *Deleted

## 2014-06-21 DIAGNOSIS — IMO0001 Reserved for inherently not codable concepts without codable children: Secondary | ICD-10-CM | POA: Diagnosis not present

## 2014-06-24 ENCOUNTER — Ambulatory Visit: Payer: BC Managed Care – PPO | Admitting: Physical Therapy

## 2014-06-24 DIAGNOSIS — IMO0001 Reserved for inherently not codable concepts without codable children: Secondary | ICD-10-CM | POA: Diagnosis not present

## 2014-06-26 ENCOUNTER — Ambulatory Visit: Payer: BC Managed Care – PPO | Admitting: Physical Therapy

## 2014-06-26 DIAGNOSIS — IMO0001 Reserved for inherently not codable concepts without codable children: Secondary | ICD-10-CM | POA: Diagnosis not present

## 2014-06-28 ENCOUNTER — Ambulatory Visit: Payer: BC Managed Care – PPO | Admitting: Physical Therapy

## 2014-06-28 DIAGNOSIS — IMO0001 Reserved for inherently not codable concepts without codable children: Secondary | ICD-10-CM | POA: Diagnosis not present

## 2014-07-01 ENCOUNTER — Ambulatory Visit: Payer: BC Managed Care – PPO | Admitting: Physical Therapy

## 2014-07-01 DIAGNOSIS — IMO0001 Reserved for inherently not codable concepts without codable children: Secondary | ICD-10-CM | POA: Diagnosis not present

## 2014-07-03 ENCOUNTER — Ambulatory Visit (INDEPENDENT_AMBULATORY_CARE_PROVIDER_SITE_OTHER): Payer: BC Managed Care – PPO | Admitting: Family

## 2014-07-03 ENCOUNTER — Ambulatory Visit: Payer: BC Managed Care – PPO | Admitting: Physical Therapy

## 2014-07-03 ENCOUNTER — Encounter: Payer: Self-pay | Admitting: Family

## 2014-07-03 VITALS — BP 139/87 | HR 87 | Temp 98.0°F | Ht 72.0 in | Wt 159.0 lb

## 2014-07-03 DIAGNOSIS — S4980XA Other specified injuries of shoulder and upper arm, unspecified arm, initial encounter: Secondary | ICD-10-CM

## 2014-07-03 DIAGNOSIS — S4991XA Unspecified injury of right shoulder and upper arm, initial encounter: Secondary | ICD-10-CM

## 2014-07-03 DIAGNOSIS — S46909A Unspecified injury of unspecified muscle, fascia and tendon at shoulder and upper arm level, unspecified arm, initial encounter: Secondary | ICD-10-CM

## 2014-07-03 DIAGNOSIS — IMO0001 Reserved for inherently not codable concepts without codable children: Secondary | ICD-10-CM | POA: Diagnosis not present

## 2014-07-03 DIAGNOSIS — S139XXA Sprain of joints and ligaments of unspecified parts of neck, initial encounter: Secondary | ICD-10-CM

## 2014-07-03 MED ORDER — MELOXICAM 15 MG PO TABS: 15.0000 mg | ORAL_TABLET | Freq: Every day | ORAL | Status: DC

## 2014-07-03 NOTE — Patient Instructions (Signed)
Muscle Cramps and Spasms °Muscle cramps and spasms occur when a muscle or muscles tighten and you have no control over this tightening (involuntary muscle contraction). They are a common problem and can develop in any muscle. The most common place is in the calf muscles of the leg. Both muscle cramps and muscle spasms are involuntary muscle contractions, but they also have differences:  °· Muscle cramps are sporadic and painful. They may last a few seconds to a quarter of an hour. Muscle cramps are often more forceful and last longer than muscle spasms. °· Muscle spasms may or may not be painful. They may also last just a few seconds or much longer. °CAUSES  °It is uncommon for cramps or spasms to be due to a serious underlying problem. In many cases, the cause of cramps or spasms is unknown. Some common causes are:  °· Overexertion.   °· Overuse from repetitive motions (doing the same thing over and over).   °· Remaining in a certain position for a long period of time.   °· Improper preparation, form, or technique while performing a sport or activity.   °· Dehydration.   °· Injury.   °· Side effects of some medicines.   °· Abnormally low levels of the salts and ions in your blood (electrolytes), especially potassium and calcium. This could happen if you are taking water pills (diuretics) or you are pregnant.   °Some underlying medical problems can make it more likely to develop cramps or spasms. These include, but are not limited to:  °· Diabetes.   °· Parkinson disease.   °· Hormone disorders, such as thyroid problems.   °· Alcohol abuse.   °· Diseases specific to muscles, joints, and bones.   °· Blood vessel disease where not enough blood is getting to the muscles.   °HOME CARE INSTRUCTIONS  °· Stay well hydrated. Drink enough water and fluids to keep your urine clear or pale yellow. °· It may be helpful to massage, stretch, and relax the affected muscle. °· For tight or tense muscles, use a warm towel, heating  pad, or hot shower water directed to the affected area. °· If you are sore or have pain after a cramp or spasm, applying ice to the affected area may relieve discomfort. °¨ Put ice in a plastic bag. °¨ Place a towel between your skin and the bag. °¨ Leave the ice on for 15-20 minutes, 03-04 times a day. °· Medicines used to treat a known cause of cramps or spasms may help reduce their frequency or severity. Only take over-the-counter or prescription medicines as directed by your caregiver. °SEEK MEDICAL CARE IF:  °Your cramps or spasms get more severe, more frequent, or do not improve over time.  °MAKE SURE YOU:  °· Understand these instructions. °· Will watch your condition. °· Will get help right away if you are not doing well or get worse. °Document Released: 03/12/2002 Document Revised: 01/15/2013 Document Reviewed: 09/06/2012 °ExitCare® Patient Information ©2015 [employer]. This information is not intended to replace advice given to you by your health care provider. Make sure you discuss any questions you have with your health care provider. ° ° °Muscle Strain °A muscle strain is an injury that occurs when a muscle is stretched beyond its normal length. Usually a small number of muscle fibers are torn when this happens. Muscle strain is rated in degrees. First-degree strains have the least amount of muscle fiber tearing and pain. Second-degree and third-degree strains have increasingly more tearing and pain.  °Usually, recovery from muscle strain takes 1-2 weeks.   Complete healing takes 5-6 weeks.  °CAUSES  °Muscle strain happens when a sudden, violent force placed on a muscle stretches it too far. This may occur with lifting, sports, or a fall.  °RISK FACTORS °Muscle strain is especially common in athletes.  °SIGNS AND SYMPTOMS °At the site of the muscle strain, there may be: °· Pain. °· Bruising. °· Swelling. °· Difficulty using the muscle due to pain or lack of normal function. °DIAGNOSIS  °Your health  care provider will perform a physical exam and ask about your medical history. °TREATMENT  °Often, the best treatment for a muscle strain is resting, icing, and applying cold compresses to the injured area.   °HOME CARE INSTRUCTIONS  °· Use the PRICE method of treatment to promote muscle healing during the first 2-3 days after your injury. The PRICE method involves: °¨ Protecting the muscle from being injured again. °¨ Restricting your activity and resting the injured body part. °¨ Icing your injury. To do this, put ice in a plastic bag. Place a towel between your skin and the bag. Then, apply the ice and leave it on from 15-20 minutes each hour. After the third day, switch to moist heat packs. °¨ Apply compression to the injured area with a splint or elastic bandage. Be careful not to wrap it too tightly. This may interfere with blood circulation or increase swelling. °¨ Elevate the injured body part above the level of your heart as often as you can. °· Only take over-the-counter or prescription medicines for pain, discomfort, or fever as directed by your health care provider. °· Warming up prior to exercise helps to prevent future muscle strains. °SEEK MEDICAL CARE IF:  °· You have increasing pain or swelling in the injured area. °· You have numbness, tingling, or a significant loss of strength in the injured area. °MAKE SURE YOU:  °· Understand these instructions. °· Will watch your condition. °· Will get help right away if you are not doing well or get worse. °Document Released: 09/20/2005 Document Revised: 07/11/2013 Document Reviewed: 04/19/2013 °ExitCare® Patient Information ©2015 [employer]. This information is not intended to replace advice given to you by your health care provider. Make sure you discuss any questions you have with your health care provider. ° ° °
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
## 2014-07-04 LAB — BMP8+EGFR
BUN/Creatinine Ratio: 14 (ref 9–20)
BUN: 14 mg/dL (ref 6–24)
CALCIUM: 9.5 mg/dL (ref 8.7–10.2)
CHLORIDE: 104 mmol/L (ref 97–108)
CO2: 20 mmol/L (ref 18–29)
Creatinine, Ser: 1 mg/dL (ref 0.76–1.27)
GFR calc non Af Amer: 87 mL/min/{1.73_m2} (ref >59)
GFR, EST AFRICAN AMERICAN: 101 mL/min/{1.73_m2} (ref >59)
GLUCOSE: 94 mg/dL (ref 65–99)
POTASSIUM: 4.2 mmol/L (ref 3.5–5.2)
Sodium: 143 mmol/L (ref 134–144)

## 2014-07-05 ENCOUNTER — Encounter: Payer: BC Managed Care – PPO | Admitting: Physical Therapy

## 2014-07-08 ENCOUNTER — Encounter: Payer: BC Managed Care – PPO | Admitting: Physical Therapy

## 2014-07-10 ENCOUNTER — Ambulatory Visit: Payer: BC Managed Care – PPO | Attending: Orthopedic Surgery | Admitting: Physical Therapy

## 2014-07-10 ENCOUNTER — Encounter: Payer: BC Managed Care – PPO | Admitting: Physical Therapy

## 2014-07-10 DIAGNOSIS — M25611 Stiffness of right shoulder, not elsewhere classified: Secondary | ICD-10-CM | POA: Diagnosis not present

## 2014-07-10 DIAGNOSIS — M25511 Pain in right shoulder: Secondary | ICD-10-CM | POA: Insufficient documentation

## 2014-07-10 DIAGNOSIS — R5381 Other malaise: Secondary | ICD-10-CM | POA: Diagnosis not present

## 2014-07-10 DIAGNOSIS — Z9889 Other specified postprocedural states: Secondary | ICD-10-CM | POA: Diagnosis not present

## 2014-07-17 ENCOUNTER — Ambulatory Visit: Payer: BC Managed Care – PPO | Admitting: Physical Therapy

## 2014-07-17 DIAGNOSIS — M25511 Pain in right shoulder: Secondary | ICD-10-CM | POA: Diagnosis not present

## 2014-12-18 ENCOUNTER — Ambulatory Visit (INDEPENDENT_AMBULATORY_CARE_PROVIDER_SITE_OTHER): Payer: BLUE CROSS/BLUE SHIELD | Admitting: Family Medicine

## 2014-12-18 ENCOUNTER — Encounter: Payer: Self-pay | Admitting: Family Medicine

## 2014-12-18 ENCOUNTER — Ambulatory Visit (INDEPENDENT_AMBULATORY_CARE_PROVIDER_SITE_OTHER): Payer: BLUE CROSS/BLUE SHIELD | Admitting: *Deleted

## 2014-12-18 ENCOUNTER — Ambulatory Visit (INDEPENDENT_AMBULATORY_CARE_PROVIDER_SITE_OTHER): Payer: BLUE CROSS/BLUE SHIELD

## 2014-12-18 VITALS — BP 124/88 | HR 75 | Temp 97.0°F | Ht 72.0 in | Wt 160.0 lb

## 2014-12-18 DIAGNOSIS — K30 Functional dyspepsia: Secondary | ICD-10-CM

## 2014-12-18 DIAGNOSIS — R109 Unspecified abdominal pain: Secondary | ICD-10-CM | POA: Diagnosis not present

## 2014-12-18 LAB — POCT CBC
Granulocyte percent: 75.6 %G (ref 37–80)
HCT, POC: 46.6 % (ref 43.5–53.7)
Hemoglobin: 15.6 g/dL (ref 14.1–18.1)
Lymph, poc: 2.1 (ref 0.6–3.4)
MCH, POC: 30.8 pg (ref 27–31.2)
MCHC: 33.4 g/dL (ref 31.8–35.4)
MCV: 92 fL (ref 80–97)
MPV: 7.6 fL (ref 0–99.8)
POC Granulocyte: 7 — AB (ref 2–6.9)
POC LYMPH %: 22.2 % (ref 10–50)
Platelet Count, POC: 284 10*3/uL (ref 142–424)
RBC: 5.07 M/uL (ref 4.69–6.13)
RDW, POC: 12.1 %
WBC: 9.3 10*3/uL (ref 4.6–10.2)

## 2014-12-18 LAB — POCT UA - MICROSCOPIC ONLY
Casts, Ur, LPF, POC: NEGATIVE
Crystals, Ur, HPF, POC: NEGATIVE
YEAST UA: NEGATIVE

## 2014-12-18 LAB — POCT URINALYSIS DIPSTICK
BILIRUBIN UA: NEGATIVE
Glucose, UA: NEGATIVE
KETONES UA: NEGATIVE
Nitrite, UA: NEGATIVE
PH UA: 5
PROTEIN UA: NEGATIVE
Spec Grav, UA: 1.025
Urobilinogen, UA: NEGATIVE

## 2014-12-18 NOTE — Progress Notes (Signed)
Subjective:    Patient ID: Garrett Lin, male    DOB: 1961-02-27, 54 y.o.   MRN: [id number]  Patient presents today with stomach pain that he states started yesterday morning. The patient has had problems with his shoulder and arm he's been taking ibuprofen sometimes twice daily off and on for a good while. He also drinks a lot of [facility] use malleolus and tea. He consumes some milk and dairy products. He does not drink alcohol. He is also a cigarette smoker.  HPI   Review of Systems  Constitutional: Negative.   HENT: Negative.   Eyes: Negative.   Respiratory: Negative.   Cardiovascular: Negative.   Gastrointestinal: Positive for abdominal pain and abdominal distention. Negative for nausea, vomiting, diarrhea, constipation, blood in stool, anal bleeding and rectal pain.  Endocrine: Negative.   Genitourinary: Negative.   Musculoskeletal: Negative.   Skin: Negative.   Allergic/Immunologic: Negative.   Neurological: Negative.   Hematological: Negative.   Psychiatric/Behavioral: Negative.        Objective:   Physical Exam  Constitutional: He is oriented to person, place, and time. He appears well-developed and well-nourished. No distress.  This patient is alert and pleasant has a thin body frame.  HENT:  Head: Normocephalic.  Eyes: Conjunctivae and EOM are normal. Pupils are equal, round, and reactive to light. Right eye exhibits no discharge. Left eye exhibits no discharge. No scleral icterus.  Neck: Normal range of motion. Neck supple. No thyromegaly present.  Cardiovascular: Normal rate, regular rhythm and normal heart sounds.   No murmur heard. Pulmonary/Chest: Effort normal and breath sounds normal. No respiratory distress. He has no wheezes. He has no rales. He exhibits no tenderness.  Abdominal: Soft. Bowel sounds are normal. He exhibits distension. He exhibits no mass. There is tenderness (there is minimal tenderness in the him umbilical area.). There is no rebound  and no guarding.  There is no inguinal adenopathy. The abdomen was very tympanic. The KUB indicated a lot of stool load in the intestinal tract.  Musculoskeletal: Normal range of motion. He exhibits no edema.  Lymphadenopathy:    He has no cervical adenopathy.  Neurological: He is alert and oriented to person, place, and time.  Skin: Skin is warm and dry. No rash noted.  Psychiatric: He has a normal mood and affect. His behavior is normal. Judgment and thought content normal.  Nursing note and vitals reviewed.   BP 124/88 mmHg  Pulse 75  Temp(Src) 97 F (36.1 C) (Oral)  Ht 6' (1.829 m)  Wt 160 lb (72.576 kg)  BMI 21.70 kg/m2  [facility] reading (PRIMARY) by  Dr.Trista Ciocca-KUB --increased stool load                                       Assessment & Plan:  1. Stomach pain -The patient is encouraged to drink plenty of water and to stop taking the ibuprofen and take Tylenol instead - DG Abd 1 View; Future - POCT UA - Microscopic Only - POCT urinalysis dipstick - POCT CBC - BMP8+EGFR - Hepatic function panel  Patient Instructions  Discontinue ibuprofen Take extra strength Tylenol as needed for pain as this will not bother her stomach Stop smoking--- discussed smoking cessation with the plant nurse and if you need to call here and make an appointment with the clinical pharmacists here and she can help you with this. Eat a more bland  diet and avoid fried foods Decrease the caffeine in the diet and this includes [facility] use cold drinks coffee and tea. You can drink decaffeinated drinks. Also for the next couple weeks avoid milk cheese ice cream and dairy products Start Zantac 150 twice daily before breakfast and supper. You can purchase this at [facility] under the brand name equate and you can get a month's supply for close to $4. Make some changes in your life that will be for the better Drink plenty of water and use MiraLAX if needed for bowel movements Return the  FOBT    Arrie Senate MD

## 2014-12-18 NOTE — Progress Notes (Signed)
Pt c/o stomach pain, nausea and diarrhea since last night. Pt given appt this afternoon with Dr.Moore.

## 2014-12-18 NOTE — Patient Instructions (Signed)
Discontinue ibuprofen Take extra strength Tylenol as needed for pain as this will not bother her stomach Stop smoking--- discussed smoking cessation with the plant nurse and if you need to call here and make an appointment with the clinical pharmacists here and she can help you with this. Eat a more bland diet and avoid fried foods Decrease the caffeine in the diet and this includes [facility] [employer] use cold drinks coffee and tea. You can drink decaffeinated drinks. Also for the next couple weeks avoid milk cheese ice cream and dairy products Start Zantac 150 twice daily before breakfast and supper. You can purchase this at [facility] under the brand name equate and you can get a month's supply for close to $4. Make some changes in your life that will be for the better Drink plenty of water and use MiraLAX if needed for bowel movements Return the FOBT

## 2014-12-19 ENCOUNTER — Emergency Department (HOSPITAL_COMMUNITY)
Admission: EM | Admit: 2014-12-19 | Discharge: 2014-12-19 | Disposition: A | Discharge status: Home / Self Care | Payer: BLUE CROSS/BLUE SHIELD | Attending: Emergency Medicine | Admitting: Emergency Medicine

## 2014-12-19 ENCOUNTER — Encounter (HOSPITAL_COMMUNITY): Payer: Self-pay | Admitting: Emergency Medicine

## 2014-12-19 ENCOUNTER — Ambulatory Visit (INDEPENDENT_AMBULATORY_CARE_PROVIDER_SITE_OTHER): Payer: BLUE CROSS/BLUE SHIELD | Admitting: Family Medicine

## 2014-12-19 ENCOUNTER — Ambulatory Visit (INDEPENDENT_AMBULATORY_CARE_PROVIDER_SITE_OTHER): Payer: BLUE CROSS/BLUE SHIELD

## 2014-12-19 ENCOUNTER — Encounter: Payer: Self-pay | Admitting: Family Medicine

## 2014-12-19 ENCOUNTER — Emergency Department (HOSPITAL_COMMUNITY): Payer: BLUE CROSS/BLUE SHIELD

## 2014-12-19 VITALS — BP 117/90 | HR 81 | Temp 97.7°F | Ht 72.0 in | Wt 159.0 lb

## 2014-12-19 DIAGNOSIS — R109 Unspecified abdominal pain: Secondary | ICD-10-CM | POA: Diagnosis present

## 2014-12-19 DIAGNOSIS — G8929 Other chronic pain: Secondary | ICD-10-CM | POA: Insufficient documentation

## 2014-12-19 DIAGNOSIS — Z720 Tobacco use: Secondary | ICD-10-CM | POA: Insufficient documentation

## 2014-12-19 DIAGNOSIS — K56609 Unspecified intestinal obstruction, unspecified as to partial versus complete obstruction: Secondary | ICD-10-CM

## 2014-12-19 DIAGNOSIS — K529 Noninfective gastroenteritis and colitis, unspecified: Secondary | ICD-10-CM | POA: Diagnosis not present

## 2014-12-19 DIAGNOSIS — K5669 Other intestinal obstruction: Secondary | ICD-10-CM | POA: Diagnosis not present

## 2014-12-19 LAB — CBC WITH DIFFERENTIAL/PLATELET
BASOS PCT: 0 % (ref 0–1)
Basophils Absolute: 0 10*3/uL (ref 0.0–0.1)
Eosinophils Absolute: 0.1 10*3/uL (ref 0.0–0.7)
Eosinophils Relative: 1 % (ref 0–5)
HEMATOCRIT: 44.9 % (ref 39.0–52.0)
Hemoglobin: 16 g/dL (ref 13.0–17.0)
LYMPHS ABS: 2.5 10*3/uL (ref 0.7–4.0)
Lymphocytes Relative: 28 % (ref 12–46)
MCH: 32.2 pg (ref 26.0–34.0)
MCHC: 35.6 g/dL (ref 30.0–36.0)
MCV: 90.3 fL (ref 78.0–100.0)
Monocytes Absolute: 0.5 10*3/uL (ref 0.1–1.0)
Monocytes Relative: 6 % (ref 3–12)
NEUTROS ABS: 5.9 10*3/uL (ref 1.7–7.7)
NEUTROS PCT: 65 % (ref 43–77)
PLATELETS: 277 10*3/uL (ref 150–400)
RBC: 4.97 MIL/uL (ref 4.22–5.81)
RDW: 11.9 % (ref 11.5–15.5)
WBC: 9 10*3/uL (ref 4.0–10.5)

## 2014-12-19 LAB — BMP8+EGFR
BUN / CREAT RATIO: 11 (ref 9–20)
BUN: 10 mg/dL (ref 6–24)
CALCIUM: 8.9 mg/dL (ref 8.7–10.2)
CO2: 24 mmol/L (ref 18–29)
CREATININE: 0.89 mg/dL (ref 0.76–1.27)
Chloride: 101 mmol/L (ref 97–108)
GFR calc non Af Amer: 99 mL/min/{1.73_m2} (ref >59)
GFR, EST AFRICAN AMERICAN: 114 mL/min/{1.73_m2} (ref >59)
GLUCOSE: 90 mg/dL (ref 65–99)
Potassium: 3.6 mmol/L (ref 3.5–5.2)
SODIUM: 142 mmol/L (ref 134–144)

## 2014-12-19 LAB — URINE MICROSCOPIC-ADD ON

## 2014-12-19 LAB — COMPREHENSIVE METABOLIC PANEL
ALK PHOS: 90 U/L (ref 39–117)
ALT: 20 U/L (ref 0–53)
AST: 19 U/L (ref 0–37)
Albumin: 3.8 g/dL (ref 3.5–5.2)
Anion gap: 11 (ref 5–15)
BUN: 8 mg/dL (ref 6–23)
CO2: 26 mmol/L (ref 19–32)
CREATININE: 1.01 mg/dL (ref 0.50–1.35)
Calcium: 9.3 mg/dL (ref 8.4–10.5)
Chloride: 100 mmol/L (ref 96–112)
GFR, EST NON AFRICAN AMERICAN: 84 mL/min — AB (ref >90)
GLUCOSE: 105 mg/dL — AB (ref 70–99)
POTASSIUM: 3.5 mmol/L (ref 3.5–5.1)
SODIUM: 137 mmol/L (ref 135–145)
TOTAL PROTEIN: 6.8 g/dL (ref 6.0–8.3)
Total Bilirubin: 1 mg/dL (ref 0.3–1.2)

## 2014-12-19 LAB — HEPATIC FUNCTION PANEL
ALT: 17 IU/L (ref 0–44)
AST: 16 IU/L (ref 0–40)
Albumin: 4 g/dL (ref 3.5–5.5)
Alkaline Phosphatase: 100 IU/L (ref 39–117)
BILIRUBIN TOTAL: 0.6 mg/dL (ref 0.0–1.2)
BILIRUBIN, DIRECT: 0.16 mg/dL (ref 0.00–0.40)
TOTAL PROTEIN: 6.3 g/dL (ref 6.0–8.5)

## 2014-12-19 LAB — URINALYSIS, ROUTINE W REFLEX MICROSCOPIC
Bilirubin Urine: NEGATIVE
GLUCOSE, UA: NEGATIVE mg/dL
Ketones, ur: NEGATIVE mg/dL
Leukocytes, UA: NEGATIVE
NITRITE: NEGATIVE
PROTEIN: NEGATIVE mg/dL
Specific Gravity, Urine: 1.01 (ref 1.005–1.030)
Urobilinogen, UA: 1 mg/dL (ref 0.0–1.0)
pH: 5.5 (ref 5.0–8.0)

## 2014-12-19 MED ORDER — CIPROFLOXACIN IN D5W 400 MG/200ML IV SOLN
400.0000 mg | Freq: Once | INTRAVENOUS | Status: AC
  Administered 2014-12-19: 400 mg via INTRAVENOUS
  Filled 2014-12-19: qty 200

## 2014-12-19 MED ORDER — SODIUM CHLORIDE 0.9 % IV BOLUS (SEPSIS)
1000.0000 mL | Freq: Once | INTRAVENOUS | Status: AC
  Administered 2014-12-19: 1000 mL via INTRAVENOUS

## 2014-12-19 MED ORDER — CIPROFLOXACIN HCL 500 MG PO TABS: 500.0000 mg | ORAL_TABLET | Freq: Two times a day (BID) | ORAL | Status: DC

## 2014-12-19 MED ORDER — METRONIDAZOLE 500 MG PO TABS: 500.0000 mg | ORAL_TABLET | Freq: Two times a day (BID) | ORAL | Status: DC

## 2014-12-19 MED ORDER — PROMETHAZINE HCL 25 MG PO TABS: 25.0000 mg | ORAL_TABLET | Freq: Four times a day (QID) | ORAL | Status: DC | PRN

## 2014-12-19 MED ORDER — IOHEXOL 300 MG/ML  SOLN
25.0000 mL | Freq: Once | INTRAMUSCULAR | Status: AC | PRN
  Administered 2014-12-19: 25 mL via ORAL

## 2014-12-19 MED ORDER — ONDANSETRON HCL 4 MG/2ML IJ SOLN: 4.0000 mg | Freq: Once | INTRAMUSCULAR | Status: DC

## 2014-12-19 MED ORDER — IOHEXOL 300 MG/ML  SOLN
100.0000 mL | Freq: Once | INTRAMUSCULAR | Status: AC | PRN
  Administered 2014-12-19: 100 mL via INTRAVENOUS

## 2014-12-19 MED ORDER — OXYCODONE-ACETAMINOPHEN 5-325 MG PO TABS
1.0000 | ORAL_TABLET | ORAL | Status: DC | PRN
Start: 2014-12-19 — End: 2015-09-01

## 2014-12-19 MED ORDER — FENTANYL CITRATE 0.05 MG/ML IJ SOLN: 100.0000 ug | Freq: Once | INTRAMUSCULAR | Status: DC

## 2014-12-19 MED ORDER — METRONIDAZOLE 500 MG PO TABS
500.0000 mg | ORAL_TABLET | Freq: Once | ORAL | Status: AC
  Administered 2014-12-19: 500 mg via ORAL
  Filled 2014-12-19: qty 1

## 2014-12-19 NOTE — Progress Notes (Signed)
Subjective:    Patient ID: Garrett Lin, male    DOB: [birth year], 54 y.o.   MRN: [id number]  HPI Patient here today for 1 day follow up on abdominal pain. We will do another KUB today because the patient is in more pain than yesterday. The patient returns to the office today because of x-ray findings that showed up partial intestinal obstruction from a KUB yesterday. On retrospect and after these findings were seen the patient told me they he had had a previous intestinal obstruction in November 2014 and had to have surgery for this. He did not tell me this yesterday. He indicates since yesterday he has had no vomiting. He has had increased pain. He is only having watery bowel movements. He has not eaten any solids since last night.         There are no active problems to display for this patient.  Outpatient Encounter Prescriptions as of 12/19/2014  Medication Sig  . ibuprofen (ADVIL,MOTRIN) 200 MG tablet Take 800 mg by mouth every 12 (twelve) hours.     Review of Systems  Constitutional: Negative.   HENT: Negative.   Eyes: Negative.   Respiratory: Negative.   Cardiovascular: Negative.   Gastrointestinal: Positive for abdominal pain.  Endocrine: Negative.   Genitourinary: Negative.   Musculoskeletal: Negative.   Skin: Negative.   Allergic/Immunologic: Negative.   Neurological: Negative.   Hematological: Negative.   Psychiatric/Behavioral: Negative.        Objective:   Physical Exam  Constitutional: He is oriented to person, place, and time. He appears well-developed and well-nourished. No distress.  HENT:  Head: Normocephalic and atraumatic.  Eyes: Conjunctivae and EOM are normal. Pupils are equal, round, and reactive to light. Right eye exhibits no discharge. Left eye exhibits no discharge. No scleral icterus.  Neck: Normal range of motion.  Abdominal: He exhibits distension. He exhibits no mass. There is tenderness. There is guarding. There is no rebound.    Musculoskeletal: Normal range of motion. He exhibits no edema.  Neurological: He is alert and oriented to person, place, and time.  Skin: Skin is warm and dry. No rash noted.  Psychiatric: He has a normal mood and affect. His behavior is normal. Judgment and thought content normal.  Nursing note and vitals reviewed.  BP 117/90 mmHg  Pulse 81  Temp(Src) 97.7 F (36.5 C) (Oral)  Ht 6' (1.829 m)  Wt 159 lb (72.122 kg)  BMI 21.56 kg/m2   [facility] reading (PRIMARY) by  Dr. Pennie RushingMoore-repeat KUB--similar to the one done yesterday but no worse.                                       Assessment & Plan:  1. Abdominal pain, unspecified abdominal location -The patient is continuing to have pain and watery bowel movements. - DG Abd 1 View; Future  2. Small bowel obstruction -I spoke with Dr. Claud KelpHaywood Ingram who operated on this patient and November 2014 for intestinal obstruction at that time. Not seen the patient since that time. The patient has been doing well since that time and developed the symptoms he is having now about 6 days ago and they became progressively worse to the point he came to the doctor's office yesterday for evaluation. The KUB films that were done were concerning for possible small bowel obstruction per the radiologist. Call the patient last night and he understands this and  he came back this morning for follow-up. He is still having abdominal pain and no vomiting. His physical findings are similar to those yesterday. After discussing the patient with the surgeon he recommended further evaluation through the ER. We are calling to arrange that.  Patient Instructions  He should go to the [facility] I have spoken with Dr. Derrell Lolling who is going to speak with Dr. Magnus Ivan his partner who is doing hospital duty today. In the emergency room the emergency room physician will get a CT scan of the abdomen and pelvis with contrast a CMP a CBC and a lipase per directions of Dr.  Derrell Lolling. Between now and that time he should only have clear liquids Once these studies have been obtained they will get in touch with the surgeon from [facility], Dr. Magnus Ivan.   Nyra Capes MD

## 2014-12-19 NOTE — ED Notes (Signed)
Called CT if they could give oral contrast in waiting room.

## 2014-12-19 NOTE — ED Notes (Signed)
Pt stable, ambulatory, wheelchair refused, wife at bedside

## 2014-12-19 NOTE — Patient Instructions (Signed)
He should go to [facility] I have spoken with Dr. Derrell LollingIngram who is going to speak with Dr. Magnus IvanBlackman his partner who is doing hospital duty today. In the emergency room the emergency room physician will get a CT scan of the abdomen and pelvis with contrast a CMP a CBC and a lipase per directions of Dr. Derrell LollingIngram. Between now and that time he should only have clear liquids Once these studies have been obtained they will get in touch with the surgeon from Dr. [facility]imIngram's practice, Dr. Magnus IvanBlackman.

## 2014-12-19 NOTE — ED Notes (Signed)
Patient completed oral contrast.

## 2014-12-19 NOTE — Discharge Instructions (Signed)

## 2014-12-19 NOTE — ED Notes (Signed)
Pt reports abdominal pain since Tues. Seen by PMD for xray. Xray suspicious for ileus or obstruction. Pt reports distended abdomen, no appetite, nausea and diarrhea.

## 2014-12-19 NOTE — ED Provider Notes (Signed)
CSN: [id number]     Arrival date & time 12/19/14  1236 History   First MD Initiated Contact with Patient 12/19/14 1821     Chief Complaint  Patient presents with  . Abdominal Pain     (Consider location/radiation/quality/duration/timing/severity/associated sxs/prior Treatment) HPI Comments: 54 year old male, prior history of a small bowel obstruction due to adhesions, required surgery in 2014, presents with abdominal pain and diarrhea which started 3 days ago. He was seen by his family doctor, and x-ray showed suspicion for an obstruction and the patient was sent to the hospital for further evaluation. He states that he has had at least 5 episodes of watery diarrhea today, he has not been vomiting and has tolerated fluids without difficulty. He does not feel distended or bloated, he has no fevers chills or vomiting, no back pain chest pain coughing or shortness of breath.   Patient is a 54 y.o. male presenting with abdominal pain. The history is provided by the patient.  Abdominal Pain   Past Medical History  Diagnosis Date  . Irritable bowel syndrome (IBS) 2006, 2007    "nervous stomach" abdominal pain  . Chronic pain     right forearm.  . Small bowel obstruction due to adhesions 09/02/2013   Past Surgical History  Procedure Laterality Date  . Colonoscopy  2006    Carman ChingJames Edwards MD  . Esophagogastroduodenoscopy  2006    Raymondo BandJames Edwwards MD  . Surgery for right forarm trauma      work related machine injury  . Laparoscopic lysis of adhesions N/A 09/02/2013    Procedure: LAPAROSCOPIC LYSIS OF ADHESIONS ;  Surgeon: Ernestene MentionHaywood M Ingram, MD;  [facility];  Service: General;  Laterality: N/A;  . Shoulder arthroscopy Right 04/2014    Dr. Ranell PatrickNorris   History reviewed. No pertinent family history. History  Substance Use Topics  . Smoking status: Current Every Day Smoker -- 1.00 packs/day for 15 years    Types: Cigarettes  . Smokeless tobacco: Never Used  . Alcohol Use: No    Review  of Systems  Gastrointestinal: Positive for abdominal pain.  All other systems reviewed and are negative.     Allergies  Review of patient's allergies indicates no known allergies.  Home Medications   Prior to Admission medications   Medication Sig Start Date End Date Taking? Authorizing Provider  ciprofloxacin (CIPRO) 500 MG tablet Take 1 tablet (500 mg total) by mouth every 12 (twelve) hours. 12/19/14   Eber HongBrian Carlie Solorzano, MD  ibuprofen (ADVIL,MOTRIN) 200 MG tablet Take 800 mg by mouth every 12 (twelve) hours.     Historical Provider, MD  metroNIDAZOLE (FLAGYL) 500 MG tablet Take 1 tablet (500 mg total) by mouth 2 (two) times daily. 12/19/14   Eber HongBrian Adylin Hankey, MD  oxyCODONE-acetaminophen (PERCOCET) 5-325 MG per tablet Take 1 tablet by mouth every 4 (four) hours as needed. 12/19/14   Eber HongBrian Margaretta Chittum, MD  promethazine (PHENERGAN) 25 MG tablet Take 1 tablet (25 mg total) by mouth every 6 (six) hours as needed for nausea or vomiting. 12/19/14   Eber HongBrian Aleeta Schmaltz, MD   BP 124/78 mmHg  Pulse 73  Temp(Src) 97.9 F (36.6 C) (Oral)  Resp 18  SpO2 99% Physical Exam  Constitutional: He appears well-developed and well-nourished. No distress.  HENT:  Head: Normocephalic and atraumatic.  Mouth/Throat: Oropharynx is clear and moist. No oropharyngeal exudate.  Eyes: Conjunctivae and EOM are normal. Pupils are equal, round, and reactive to light. Right eye exhibits no discharge. Left eye exhibits no discharge.  No scleral icterus.  Neck: Normal range of motion. Neck supple. No JVD present. No thyromegaly present.  Cardiovascular: Normal rate, regular rhythm, normal heart sounds and intact distal pulses.  Exam reveals no gallop and no friction rub.   No murmur heard. Pulmonary/Chest: Effort normal and breath sounds normal. No respiratory distress. He has no wheezes. He has no rales.  Abdominal: Soft. Bowel sounds are normal. He exhibits no distension and no mass. There is tenderness ( Mild to moderate tenderness across  the mid abdomen including right and left mid abdomen and lower abdomen, nonfocal, no peritoneal signs, no tympanitic sounds to percussion).  Musculoskeletal: Normal range of motion. He exhibits no edema or tenderness.  Lymphadenopathy:    He has no cervical adenopathy.  Neurological: He is alert. Coordination normal.  Skin: Skin is warm and dry. No rash noted. No erythema.  Psychiatric: He has a normal mood and affect. His behavior is normal.  Nursing note and vitals reviewed.   ED Course  Procedures (including critical care time) Labs Review Labs Reviewed  COMPREHENSIVE METABOLIC PANEL - Abnormal; Notable for the following:    Glucose, Bld 105 (*)    GFR calc non Af Amer 84 (*)    All other components within normal limits  CBC WITH DIFFERENTIAL/PLATELET  URINALYSIS, ROUTINE W REFLEX MICROSCOPIC    Imaging Review Dg Abd 1 View  12/19/2014   CLINICAL DATA:  History of obstruction, increasing abdominal pain  EXAM: ABDOMEN - 1 VIEW  COMPARISON:  Abdominal film of December 17, 2013  FINDINGS: There are persistent mildly dilated gas-filled small bowel loops in the upper abdomen. There are small air-fluid levels within bowel in the right mid abdomen. There is gas within the stomach. There is small amount of rectal gas. No free extraluminal gas collections are demonstrated. The bony structures are unremarkable.  IMPRESSION: Findings consistent with an ileus or partial mid to distal small bowel obstruction. There has not been dramatic interval change since yesterday's study.   Electronically Signed   By: David  [place]   On: 12/19/2014 12:01   Dg Abd 1 View  12/18/2014   CLINICAL DATA:  Acute generalized abdominal pain.  EXAM: ABDOMEN - 1 VIEW  COMPARISON:  June 20, 2014.  FINDINGS: Mildly dilated small bowel loops are noted in the left side of the abdomen concerning for possible distal small bowel obstruction or ileus. No colonic dilatation is noted. No radio-opaque calculi or other significant  radiographic abnormality are seen.  IMPRESSION: Mildly dilated small bowel loops seen in left side of abdomen with air-fluid levels concerning for distal small bowel obstruction or possibly ileus. Followup radiographs are recommended.   Electronically Signed   By: Lupita Raider, M.D.   On: 12/18/2014 16:20   Ct Abdomen Pelvis W Contrast  12/19/2014   CLINICAL DATA:  Non localized abdominal pain and distention for the past 2 days.  EXAM: CT ABDOMEN AND PELVIS WITH CONTRAST  TECHNIQUE: Multidetector CT imaging of the abdomen and pelvis was performed using the standard protocol following bolus administration of intravenous contrast.  CONTRAST:  OMNIPAQUE IOHEXOL 300 MG/ML  SOLN  COMPARISON:  Abdomen radiograph obtained earlier today.  FINDINGS: There is diffuse low density wall thickening involving multiple loops of distal small bowel, including the terminal ileum. The wall thickening measures up to 1.3 cm in thickness. No soft tissue stranding. Normal appearing colon. No gastric abnormalities or enlarged lymph nodes.  Unremarkable liver, spleen, pancreas, gallbladder, adrenal glands, kidneys and urinary  bladder. Mildly enlarged prostate gland. Minimal free peritoneal fluid. Clear lung bases. Mild atheromatous arterial calcifications. No areas of vascular occlusion seen. No pneumatosis or portal venous gas. Mild lumbar and lower thoracic spine degenerative changes.  IMPRESSION: 1. Diffuse low density wall thickening involving multiple loops of distal small bowel, including the terminal ileum. This is most likely due to infectious or inflammatory enteritis. Ischemic enteritis is less likely. 2. Minimal associated free peritoneal fluid.   Electronically Signed   By: Beckie Salts M.D.   On: 12/19/2014 18:04      MDM   Final diagnoses:  Enteritis    The patient has no findings to suggest a bowel obstruction on x-ray, he does have signs of wall thickening in the distal small bowel, will treat as an  enteritis or colitis, antibiotics fluids and nausea medications.  Labs are totally unremarkable with normal kidney function, normal electrolytes and normal blood counts. The patient was informed of all of these findings, he is in agreement with the plan.  Tolerated meds, informed of plan - in agreement, no signs of obstruction   Meds given in ED:  Medications  ondansetron (ZOFRAN) injection 4 mg (0 mg Intravenous Hold 12/19/14 1925)  fentaNYL (SUBLIMAZE) injection 100 mcg (0 mcg Intravenous Hold 12/19/14 1924)  iohexol (OMNIPAQUE) 300 MG/ML solution 25 mL (25 mLs Oral Contrast Given 12/19/14 1704)  iohexol (OMNIPAQUE) 300 MG/ML solution 100 mL (100 mLs Intravenous Contrast Given 12/19/14 1732)  ciprofloxacin (CIPRO) IVPB 400 mg (0 mg Intravenous Stopped 12/19/14 1952)  metroNIDAZOLE (FLAGYL) tablet 500 mg (500 mg Oral Given 12/19/14 1856)  sodium chloride 0.9 % bolus 1,000 mL (1,000 mLs Intravenous New Bag/Given 12/19/14 1951)  sodium chloride 0.9 % bolus 1,000 mL (0 mLs Intravenous Stopped 12/19/14 1952)        Eber Hong, MD 12/19/14 2002

## 2014-12-19 NOTE — ED Notes (Signed)
Transported to CT 

## 2015-09-01 ENCOUNTER — Ambulatory Visit (INDEPENDENT_AMBULATORY_CARE_PROVIDER_SITE_OTHER): Payer: BLUE CROSS/BLUE SHIELD | Admitting: Pediatrics

## 2015-09-01 ENCOUNTER — Encounter: Payer: Self-pay | Admitting: Pediatrics

## 2015-09-01 VITALS — BP 126/91 | HR 93 | Temp 99.1°F | Ht 72.0 in | Wt 158.0 lb

## 2015-09-01 DIAGNOSIS — R6889 Other general symptoms and signs: Secondary | ICD-10-CM | POA: Diagnosis not present

## 2015-09-01 NOTE — Progress Notes (Signed)
    Subjective:    Patient ID: Garrett Lin, male    DOB: July 10, 1961, 54 y.o.   MRN: [id number]  CC: Diarrhea; Heartburn; Joint Pain; and Emesis   HPI: Garrett Lin is a 54 y.o. male presenting for Diarrhea; Heartburn; Temporomandibular Joint Pain; and Emesis  Had chicken nuggets at [employer] two days ago, hadnt had it in over 6 months. Started getting heartburn later that day. Next morning had 3 episodes of emesis, water and grease, no green or blood. This morning woke up and had diarrhea x 3. Has felt achey, had shakes, fevers. Has drunk some water and sprite Drinking water Eating oranges today, has stayed down Not tried any medicines    [facility] 09/01/2015 12/19/2014  Decreased Interest 0 0  Down, Depressed, Hopeless 0 0  PHQ - 2 Score 0 0     Relevant past medical, surgical, family and social history reviewed and updated as indicated. Interim medical history since our last visit reviewed. Allergies and medications reviewed and updated.    ROS: Per HPI unless specifically indicated above  Past Medical History There are no active problems to display for this patient.   No current outpatient prescriptions on file.   No current facility-administered medications for this visit.       Objective:    BP 126/91 mmHg  Pulse 93  Temp(Src) 99.1 F (37.3 C) (Oral)  Ht 6' (1.829 m)  Wt 158 lb (71.668 kg)  BMI 21.42 kg/m2  Wt Readings from Last 3 Encounters:  09/01/15 158 lb (71.668 kg)  12/19/14 159 lb (72.122 kg)  12/18/14 160 lb (72.576 kg)    Gen: NAD, alert, cooperative with exam, NCAT EYES: EOMI, no scleral injection or icterus ENT:  TMs dull gray b/l, OP without erythema LYMPH: no cervical LAD CV: NRRR, normal S1/S2, no murmur, distal pulses 2+ b/l Resp: CTABL, no wheezes, normal WOB Abd: +BS, soft, NTND. no guarding or organomegaly Ext: No edema, warm Neuro: Alert and oriented, strength equal b/l UE and LE, coordination grossly  normal MSK: normal muscle bulk, no joint swelling or redness     Assessment & Plan:    Garrett CoveRoger was seen today for flu-like symptoms, ongoing past two days. Diarrhea now resolved, last emesis two days ago that seemed related to certain foods he was eating more. Keeping fluids, some food down fine now, no abd pain. Discussed testing for flu, side effects and benefits of tamiflu if he does test positive. Pt declined testing now. Plan: symptomatic care: Tylenol at home, lots of fluids, let me know if not improving or worsening. Note for school.   Diagnoses and all orders for this visit:  Flu-like symptoms  Follow up plan: prn  Rex Krasarol Rhylan Gross, MD [facility] 09/01/2015, 3:18 PM

## 2015-09-22 ENCOUNTER — Ambulatory Visit (INDEPENDENT_AMBULATORY_CARE_PROVIDER_SITE_OTHER): Payer: BLUE CROSS/BLUE SHIELD | Admitting: Family Medicine

## 2015-09-22 ENCOUNTER — Encounter: Payer: Self-pay | Admitting: Family Medicine

## 2015-09-22 VITALS — BP 121/89 | HR 80 | Temp 98.7°F | Ht 72.0 in | Wt 156.8 lb

## 2015-09-22 DIAGNOSIS — Z0000 Encounter for general adult medical examination without abnormal findings: Secondary | ICD-10-CM | POA: Diagnosis not present

## 2015-09-22 DIAGNOSIS — Z13220 Encounter for screening for lipoid disorders: Secondary | ICD-10-CM

## 2015-09-22 DIAGNOSIS — Z125 Encounter for screening for malignant neoplasm of prostate: Secondary | ICD-10-CM

## 2015-09-22 DIAGNOSIS — Z131 Encounter for screening for diabetes mellitus: Secondary | ICD-10-CM

## 2015-09-22 NOTE — Progress Notes (Signed)
BP 121/89 mmHg  Pulse 80  Temp(Src) 98.7 F (37.1 C) (Oral)  Ht 6' (1.829 m)  Wt 156 lb 12.8 oz (71.124 kg)  BMI 21.26 kg/m2   Subjective:    Patient ID: Garrett Lin, male    DOB: 1960-11-10, 54 y.o.   MRN: [id number]  HPI: [name] is a 54 y.o. male presenting on 09/22/2015 for Annual Exam   HPI Adult well exam Patient comes in today for adult well exam and physical for his work. He denies any chest pain, shortness of breath, headaches or vision issues, abdominal complaints, diarrhea, nausea, vomiting, or joint issues.   Relevant past medical, surgical, family and social history reviewed and updated as indicated. Interim medical history since our last visit reviewed. Allergies and medications reviewed and updated.  Review of Systems  Constitutional: Negative for fever.  HENT: Negative for ear discharge and ear pain.   Eyes: Negative for discharge and visual disturbance.  Respiratory: Negative for shortness of breath and wheezing.   Cardiovascular: Negative for chest pain and leg swelling.  Gastrointestinal: Negative for abdominal pain, diarrhea and constipation.  Genitourinary: Negative for difficulty urinating.  Musculoskeletal: Negative for back pain and gait problem.  Skin: Negative for rash.  Neurological: Negative for syncope, light-headedness and headaches.  All other systems reviewed and are negative.   Per HPI unless specifically indicated above     Medication List       This list is accurate as of: 09/22/15 11:30 AM.  Always use your most recent med list.               acetaminophen-codeine 300-30 MG tablet  Commonly known as:  TYLENOL #3  Take by mouth every 6 (six) hours as needed for moderate pain.     penicillin v potassium 500 MG tablet  Commonly known as:  VEETID  Take 500 mg by mouth 4 (four) times daily.           Objective:    BP 121/89 mmHg  Pulse 80  Temp(Src) 98.7 F (37.1 C) (Oral)  Ht 6' (1.829 m)  Wt 156 lb 12.8  oz (71.124 kg)  BMI 21.26 kg/m2  Wt Readings from Last 3 Encounters:  09/22/15 156 lb 12.8 oz (71.124 kg)  09/01/15 158 lb (71.668 kg)  12/19/14 159 lb (72.122 kg)    Physical Exam  Constitutional: He is oriented to person, place, and time. He appears well-developed and well-nourished. No distress.  Eyes: Conjunctivae and EOM are normal. Pupils are equal, round, and reactive to light. Right eye exhibits no discharge. No scleral icterus.  Neck: Neck supple. No thyromegaly present.  Cardiovascular: Normal rate, regular rhythm, normal heart sounds and intact distal pulses.   No murmur heard. Pulmonary/Chest: Effort normal and breath sounds normal. No respiratory distress. He has no wheezes.  Abdominal: Soft. Bowel sounds are normal. He exhibits no distension. There is no tenderness. There is no rebound.  Musculoskeletal: Normal range of motion. He exhibits no edema.  Lymphadenopathy:    He has no cervical adenopathy.  Neurological: He is alert and oriented to person, place, and time. Coordination normal.  Skin: Skin is warm and dry. No rash noted. He is not diaphoretic.  Psychiatric: He has a normal mood and affect. His behavior is normal.  Nursing note and vitals reviewed.      Assessment & Plan:       Problem List Items Addressed This Visit    None    Visit Diagnoses  Well adult exam    -  Primary    Screening for diabetes mellitus (DM)        Relevant Orders    CMP14+EGFR    Screening for lipid disorders        Relevant Orders    Lipid panel    Screening for prostate cancer        Relevant Orders    PSA, total and free        Follow up plan: Return in about 1 year (around 09/21/2016), or if symptoms worsen or fail to improve.  Counseling provided for all of the vaccine components Orders Placed This Encounter  Procedures  . CMP14+EGFR  . Lipid panel  . PSA, total and free    Caryl Pina, MD [facility] 09/22/2015, 11:30  AM

## 2015-09-23 LAB — LIPID PANEL
Chol/HDL Ratio: 3.6 ratio units (ref 0.0–5.0)
Cholesterol, Total: 169 mg/dL (ref 100–199)
HDL: 47 mg/dL (ref >39)
LDL Calculated: 99 mg/dL (ref 0–99)
TRIGLYCERIDES: 115 mg/dL (ref 0–149)
VLDL CHOLESTEROL CAL: 23 mg/dL (ref 5–40)

## 2015-09-23 LAB — CMP14+EGFR
ALBUMIN: 4.4 g/dL (ref 3.5–5.5)
ALK PHOS: 108 IU/L (ref 39–117)
ALT: 29 IU/L (ref 0–44)
AST: 26 IU/L (ref 0–40)
Albumin/Globulin Ratio: 1.7 (ref 1.1–2.5)
BUN/Creatinine Ratio: 10 (ref 9–20)
BUN: 10 mg/dL (ref 6–24)
Bilirubin Total: 0.6 mg/dL (ref 0.0–1.2)
CO2: 24 mmol/L (ref 18–29)
CREATININE: 1.01 mg/dL (ref 0.76–1.27)
Calcium: 9.7 mg/dL (ref 8.7–10.2)
Chloride: 99 mmol/L (ref 96–106)
GFR calc Af Amer: 98 mL/min/{1.73_m2} (ref >59)
GFR calc non Af Amer: 85 mL/min/{1.73_m2} (ref >59)
Globulin, Total: 2.6 g/dL (ref 1.5–4.5)
Glucose: 98 mg/dL (ref 65–99)
Potassium: 4.4 mmol/L (ref 3.5–5.2)
Sodium: 138 mmol/L (ref 134–144)
Total Protein: 7 g/dL (ref 6.0–8.5)

## 2015-09-23 LAB — PSA, TOTAL AND FREE
PSA, Free Pct: 37.3 %
PSA, Free: 0.41 ng/mL
Prostate Specific Ag, Serum: 1.1 ng/mL (ref 0.0–4.0)

## 2017-01-24 ENCOUNTER — Ambulatory Visit (INDEPENDENT_AMBULATORY_CARE_PROVIDER_SITE_OTHER): Payer: BLUE CROSS/BLUE SHIELD | Admitting: Family Medicine

## 2017-01-24 ENCOUNTER — Encounter: Payer: Self-pay | Admitting: Family Medicine

## 2017-01-24 ENCOUNTER — Ambulatory Visit (HOSPITAL_COMMUNITY)
Admission: RE | Admit: 2017-01-24 | Discharge: 2017-01-24 | Disposition: A | Discharge status: Home / Self Care | Payer: BLUE CROSS/BLUE SHIELD | Source: Ambulatory Visit | Attending: Family Medicine | Admitting: Family Medicine

## 2017-01-24 VITALS — BP 136/85 | HR 84 | Temp 97.9°F | Ht 72.0 in | Wt 148.0 lb

## 2017-01-24 DIAGNOSIS — N400 Enlarged prostate without lower urinary tract symptoms: Secondary | ICD-10-CM | POA: Insufficient documentation

## 2017-01-24 DIAGNOSIS — R1032 Left lower quadrant pain: Secondary | ICD-10-CM | POA: Insufficient documentation

## 2017-01-24 DIAGNOSIS — I700 Atherosclerosis of aorta: Secondary | ICD-10-CM | POA: Insufficient documentation

## 2017-01-24 MED ORDER — ONDANSETRON 4 MG PO TBDP: 4.0000 mg | ORAL_TABLET | Freq: Three times a day (TID) | ORAL | 0 refills | Status: DC | PRN

## 2017-01-24 MED ORDER — METRONIDAZOLE 500 MG PO TABS: 500.0000 mg | ORAL_TABLET | Freq: Two times a day (BID) | ORAL | 0 refills | Status: DC

## 2017-01-24 MED ORDER — CIPROFLOXACIN HCL 500 MG PO TABS: 500.0000 mg | ORAL_TABLET | Freq: Two times a day (BID) | ORAL | 0 refills | Status: DC

## 2017-01-24 MED ORDER — IOPAMIDOL (ISOVUE-300) INJECTION 61%
100.0000 mL | Freq: Once | INTRAVENOUS | Status: AC | PRN
  Administered 2017-01-24: 100 mL via INTRAVENOUS

## 2017-01-24 NOTE — Progress Notes (Signed)
BP 136/85   Pulse 84   Temp 97.9 F (36.6 C) (Oral)   Ht 6' (1.829 m)   Wt 148 lb (67.1 kg)   BMI 20.07 kg/m    Subjective:    Patient ID: Garrett Lin, male    DOB: 1961/08/19, 56 y.o.   MRN: [id number]  HPI: Edric Fetterman is a 56 y.o. male presenting on 01/24/2017 for Watery diarhea, vomiting, abdominal pain and bloating (began 3 days ago, history of bowel obstruction, enteritis)   Abdominal Pain  This is a new problem. Episode onset: 3 days ago. The problem occurs constantly. The problem has been unchanged. The pain is located in the generalized abdominal region. The pain is severe. The quality of the pain is sharp and aching. The abdominal pain does not radiate. Associated symptoms include diarrhea, nausea and vomiting. Pertinent negatives include no arthralgias, constipation, dysuria, fever, hematochezia, hematuria or melena. Associated symptoms comments: Thigh cramping. The pain is aggravated by vomiting. The pain is relieved by nothing. Treatments tried: Mylanta. Improvement on treatment: minimal. His past medical history is significant for irritable bowel syndrome. Small Bowel Obstruction 2015  The pain is worse on the left side of his abdomen. He is concerned that he has been having cramping in his legs that he started to get dehydrated. He denies any blood in his vomit. He does not feel like it is worsening but he does not feel like it's improving and the symptoms he feels are similar to when he had an obstruction in 2015. He is passing flatus and having diarrhea so he has not been obstructed yet.   Relevant past medical, surgical, family and social history reviewed and updated as indicated. Interim medical history since our last visit reviewed. Allergies and medications reviewed and updated.  Review of Systems  Constitutional: Negative for chills, diaphoresis and fever.  HENT: Negative.   Eyes: Negative.   Respiratory: Negative.  Negative for cough and shortness of breath.    Cardiovascular: Negative.  Negative for chest pain and palpitations.  Gastrointestinal: Positive for abdominal pain, diarrhea, nausea and vomiting. Negative for abdominal distention, blood in stool, constipation, hematochezia and melena.  Endocrine: Negative.   Genitourinary: Negative.  Negative for dysuria, flank pain and hematuria.  Musculoskeletal: Negative for arthralgias, neck pain and neck stiffness.       Thigh Cramping  Allergic/Immunologic: Negative.   Neurological: Negative.  Negative for dizziness and numbness.    Per HPI unless specifically indicated above      Objective:    BP 136/85   Pulse 84   Temp 97.9 F (36.6 C) (Oral)   Ht 6' (1.829 m)   Wt 148 lb (67.1 kg)   BMI 20.07 kg/m   Wt Readings from Last 3 Encounters:  01/24/17 148 lb (67.1 kg)  09/22/15 156 lb 12.8 oz (71.1 kg)  09/01/15 158 lb (71.7 kg)    Physical Exam  Constitutional: He is oriented to person, place, and time. He appears well-developed and well-nourished. No distress.  HENT:  Head: Normocephalic and atraumatic.  Eyes: Conjunctivae and EOM are normal. Pupils are equal, round, and reactive to light.  Neck: Normal range of motion. Neck supple.  Cardiovascular: Normal rate, regular rhythm and intact distal pulses.  Exam reveals no gallop and no friction rub.   No murmur heard. Pulmonary/Chest: Effort normal and breath sounds normal. No respiratory distress. He has no wheezes. He has no rales.  Abdominal: Soft. Normal appearance and bowel sounds are normal. He exhibits no  distension and no mass. There is generalized tenderness (Generalized abdominal pain but worse on the left side of his abdomen and specifically more in the left lower quadrant). There is guarding. There is no rebound and no CVA tenderness.  Musculoskeletal: Normal range of motion.  Neurological: He is alert and oriented to person, place, and time.  Skin: Skin is warm and dry. He is not diaphoretic.  Psychiatric: He has a normal  mood and affect. His behavior is normal.      Assessment & Plan:   Problem List Items Addressed This Visit    None    Visit Diagnoses    Abdominal pain, LLQ (left lower quadrant)    -  Primary   Relevant Medications   ondansetron (ZOFRAN ODT) 4 MG disintegrating tablet   ciprofloxacin (CIPRO) 500 MG tablet   metroNIDAZOLE (FLAGYL) 500 MG tablet   Other Relevant Orders   CT Abdomen Pelvis W Contrast      We'll send the patient for a CT abdomen and pelvis, if no major abnormalities are found then we'll treat like a diverticulitis with Zofran and Cipro and Flagyl but patient was instructed that if anything major like an obstruction was found that he would be admitted to the emergency department. Patient with [facility] Hawking further imaging.  Follow up plan: Return if symptoms worsen or fail to improve.  Counseling provided for all of the vaccine components Orders Placed This Encounter  Procedures  . CT Abdomen Pelvis W Contrast

## 2017-01-24 NOTE — Patient Instructions (Signed)
Sending for CT scan of the abdomen, if CT scan stat does not show any major abnormalities then we will send home with Zofran and ciprofloxacin and Flagyl.  If CT scan shows abnormalities that her major, will send to the emergency department.

## 2017-01-25 ENCOUNTER — Telehealth: Payer: Self-pay | Admitting: Family Medicine

## 2017-01-26 ENCOUNTER — Encounter: Payer: Self-pay | Admitting: *Deleted

## 2017-01-26 NOTE — Telephone Encounter (Signed)
Work note was ready and patient came by office today.

## 2017-01-26 NOTE — Telephone Encounter (Signed)
I attempted to call the patient but I still cannot contact him because he still does not have a pre-paid card and his phone shows up as disconnected. I am fine with changing the letter, but if he calls that we can discuss what his possible issue with the antibiotics that I gave him. Those are pretty standard antibiotics to treat colitis. The antinausea pill is pretty standard 2 to help him with that. I do not understand what is issue with the medications that I prescribed are. Arville Care, MD [facility] 01/26/2017, 7:56 AM

## 2017-01-26 NOTE — Telephone Encounter (Signed)
There are no working phone numbers for this patient.   Work note is ready at front office .

## 2017-01-31 ENCOUNTER — Telehealth: Payer: Self-pay | Admitting: Family

## 2017-01-31 NOTE — Telephone Encounter (Signed)
I am okay with that, due to the end of taking his antibiotics, I tried to call him on this but his phone was still not connected. If he is still not feeling well then he needs to come back and be seen

## 2017-02-04 ENCOUNTER — Ambulatory Visit (INDEPENDENT_AMBULATORY_CARE_PROVIDER_SITE_OTHER): Payer: BLUE CROSS/BLUE SHIELD | Admitting: Family

## 2017-02-04 ENCOUNTER — Encounter: Payer: Self-pay | Admitting: Family

## 2017-02-04 VITALS — BP 119/83 | HR 82 | Temp 98.2°F | Ht 72.0 in | Wt 153.0 lb

## 2017-02-04 DIAGNOSIS — R197 Diarrhea, unspecified: Secondary | ICD-10-CM

## 2017-02-04 DIAGNOSIS — F17200 Nicotine dependence, unspecified, uncomplicated: Secondary | ICD-10-CM

## 2017-02-04 DIAGNOSIS — R1032 Left lower quadrant pain: Secondary | ICD-10-CM | POA: Diagnosis not present

## 2017-02-04 DIAGNOSIS — Z720 Tobacco use: Secondary | ICD-10-CM | POA: Insufficient documentation

## 2017-02-04 NOTE — Patient Instructions (Signed)

## 2017-02-04 NOTE — Progress Notes (Signed)
   Subjective:    Patient ID: Garrett Lin, male    DOB: 03/04/1961, 56 y.o.   MRN: [id number]  Pt was seen in the office on 01/24/17 and diagnosed with diverticulitis. Pt has taken flagyl and cipro, but continues with diarrhea. States he is having >11 stools a day.  Diarrhea   This is a recurrent problem. The current episode started 1 to 4 weeks ago. The problem occurs more than 10 times per day. The problem has been gradually worsening. The stool consistency is described as watery. The patient states that diarrhea awakens him from sleep. Associated symptoms include bloating and increased flatus. Pertinent negatives include no chills, vomiting or weight loss. Associated symptoms comments: Belching . Risk factors include recent antibiotic use. He has tried change of diet and increased fluids for the symptoms. The treatment provided mild relief.      Review of Systems  Constitutional: Negative for chills and weight loss.  Gastrointestinal: Positive for bloating, diarrhea and flatus. Negative for vomiting.  All other systems reviewed and are negative.      Objective:   Physical Exam  Constitutional: He is oriented to person, place, and time. He appears well-developed and well-nourished. No distress.  HENT:  Head: Normocephalic.  Eyes: Pupils are equal, round, and reactive to light. Right eye exhibits no discharge. Left eye exhibits no discharge.  Neck: Normal range of motion. Neck supple. No thyromegaly present.  Cardiovascular: Normal rate, regular rhythm, normal heart sounds and intact distal pulses.   No murmur heard. Pulmonary/Chest: Effort normal and breath sounds normal. No respiratory distress. He has no wheezes.  Abdominal: Soft. Bowel sounds are normal. He exhibits no distension. There is tenderness (LLQ tenderness).  Musculoskeletal: Normal range of motion. He exhibits no edema or tenderness.  Neurological: He is alert and oriented to person, place, and time.  Skin: Skin is  warm and dry. No rash noted. No erythema.  Psychiatric: He has a normal mood and affect. His behavior is normal. Judgment and thought content normal.  Vitals reviewed.     BP 119/83   Pulse 82   Temp 98.2 F (36.8 C) (Oral)   Ht 6' (1.829 m)   Wt 153 lb (69.4 kg)   BMI 20.75 kg/m      Assessment & Plan:  1. Diarrhea, unspecified type - Cdiff NAA+O+P+Stool Culture - CBC with Differential/Platelet - CMP14+EGFR  2. Left lower quadrant pain - Cdiff NAA+O+P+Stool Culture - CBC with Differential/Platelet - CMP14+EGFR  3. Current smoker - Cdiff NAA+O+P+Stool Culture - CBC with Differential/Platelet - CMP14+EGFR  Force fluids Bland diet Worrisome for C diff If stool panel negative and lab work stable will do referral to GI RTO prn, PCP to do FMLA paperwork Note given to return to work on 02/07/17  Evelina Dun, FNP

## 2017-02-05 ENCOUNTER — Other Ambulatory Visit: Payer: BLUE CROSS/BLUE SHIELD

## 2017-02-05 LAB — CMP14+EGFR
ALBUMIN: 4 g/dL (ref 3.5–5.5)
ALT: 37 IU/L (ref 0–44)
AST: 28 IU/L (ref 0–40)
Albumin/Globulin Ratio: 1.7 (ref 1.2–2.2)
Alkaline Phosphatase: 80 IU/L (ref 39–117)
BUN / CREAT RATIO: 12 (ref 9–20)
BUN: 10 mg/dL (ref 6–24)
Bilirubin Total: 0.3 mg/dL (ref 0.0–1.2)
CALCIUM: 9.4 mg/dL (ref 8.7–10.2)
CO2: 23 mmol/L (ref 18–29)
CREATININE: 0.85 mg/dL (ref 0.76–1.27)
Chloride: 106 mmol/L (ref 96–106)
GFR calc Af Amer: 115 mL/min/{1.73_m2} (ref >59)
GFR, EST NON AFRICAN AMERICAN: 99 mL/min/{1.73_m2} (ref >59)
GLUCOSE: 95 mg/dL (ref 65–99)
Globulin, Total: 2.4 g/dL (ref 1.5–4.5)
Potassium: 4.5 mmol/L (ref 3.5–5.2)
Sodium: 141 mmol/L (ref 134–144)
Total Protein: 6.4 g/dL (ref 6.0–8.5)

## 2017-02-05 LAB — CBC WITH DIFFERENTIAL/PLATELET
BASOS ABS: 0 10*3/uL (ref 0.0–0.2)
Basos: 0 %
EOS (ABSOLUTE): 0.2 10*3/uL (ref 0.0–0.4)
Eos: 2 %
HEMOGLOBIN: 15.8 g/dL (ref 13.0–17.7)
Hematocrit: 47 % (ref 37.5–51.0)
Immature Grans (Abs): 0 10*3/uL (ref 0.0–0.1)
Immature Granulocytes: 0 %
LYMPHS: 27 %
Lymphocytes Absolute: 2.8 10*3/uL (ref 0.7–3.1)
MCH: 31.8 pg (ref 26.6–33.0)
MCHC: 33.6 g/dL (ref 31.5–35.7)
MCV: 95 fL (ref 79–97)
MONOCYTES: 6 %
Monocytes Absolute: 0.7 10*3/uL (ref 0.1–0.9)
NEUTROS ABS: 6.9 10*3/uL (ref 1.4–7.0)
Neutrophils: 65 %
Platelets: 310 10*3/uL (ref 150–379)
RBC: 4.97 x10E6/uL (ref 4.14–5.80)
RDW: 12.7 % (ref 12.3–15.4)
WBC: 10.7 10*3/uL (ref 3.4–10.8)

## 2017-02-07 ENCOUNTER — Ambulatory Visit (INDEPENDENT_AMBULATORY_CARE_PROVIDER_SITE_OTHER): Payer: BLUE CROSS/BLUE SHIELD | Admitting: Physician Assistant

## 2017-02-07 ENCOUNTER — Encounter: Payer: Self-pay | Admitting: Physician Assistant

## 2017-02-07 VITALS — BP 130/89 | HR 74 | Temp 97.6°F | Ht 72.0 in | Wt 152.6 lb

## 2017-02-07 DIAGNOSIS — W57XXXA Bitten or stung by nonvenomous insect and other nonvenomous arthropods, initial encounter: Secondary | ICD-10-CM | POA: Diagnosis not present

## 2017-02-07 DIAGNOSIS — S30861A Insect bite (nonvenomous) of abdominal wall, initial encounter: Secondary | ICD-10-CM | POA: Diagnosis not present

## 2017-02-07 MED ORDER — DOXYCYCLINE HYCLATE 100 MG PO TABS: 100.0000 mg | ORAL_TABLET | Freq: Two times a day (BID) | ORAL | 0 refills | Status: DC

## 2017-02-07 NOTE — Patient Instructions (Signed)
Lyme Disease Lyme disease is an infection that affects many parts of the body, including the skin, joints, and nervous system. It is a bacterial infection that starts from the bite of an infected tick. The infection can spread, and some of the symptoms are similar to the flu. If Lyme disease is not treated, it may cause joint pain, swelling, numbness, problems thinking, fatigue, muscle weakness, and other problems. What are the causes? This condition is caused by bacteria called Borrelia burgdorferi. You can get Lyme disease by being bitten by an infected tick. The tick must be attached to your skin to pass along the infection. Deer often carry infected ticks. What increases the risk? The following factors may make you more likely to develop this condition:  Living in or visiting these areas in the [place]  [place].  The mid[place] states.  The [place].  Spending time in wooded or grassy areas.  Being outdoors with exposed skin.  Camping, gardening, hiking, fishing, or hunting outdoors.  Failing to remove a tick from your skin within 3-4 days. What are the signs or symptoms? Symptoms of this condition include:  A round, red rash that surrounds the center of the tick bite. This is the first sign of infection. The center of the rash may be blood colored or have tiny blisters.  Fatigue.  Headache.  Chills and fever.  General achiness.  Joint pain, often in the knees.  Muscle pain.  Swollen lymph glands.  Stiff neck. How is this diagnosed? This condition is diagnosed based on:  Your symptoms and medical history.  A physical exam.  A blood test. How is this treated? The main treatment for this condition is antibiotic medicine, which is usually taken by mouth (orally). The length of treatment depends on how soon after a tick bite you begin taking the medicine. In some cases, treatment is necessary for several weeks. If the infection is severe, antibiotics may  need to be given through an IV tube that is inserted into one of your veins. Follow these instructions at home:  Take your antibiotic medicine as told by your health care provider. Do not stop taking the antibiotic even if you start to feel better.  Ask your health care provider about takinga probiotic in between doses of your antibiotic to help avoid stomach upset or diarrhea.  Check with your health care provider before supplementing your treatment. Many alternative therapies have not been proven and may be harmful to you.  Keep all follow-up visits as told by your health care provider. This is important. How is this prevented? You can become reinfected if you get another tick bite from an infected tick. Take these steps to help prevent an infection:  Cover your skin with light-colored clothing when you are outdoors in the spring and summer months.  Spray clothing and skin with bug spray. The spray should be 20-30% DEET.  Avoid wooded, grassy, and shaded areas.  Remove yard litter, brush, trash, and plants that attract deer and rodents.  Check yourself for ticks when you come indoors.  Wash clothing worn each day.  Check your pets for ticks before they come inside.  If you find a tick:  Remove it with tweezers.  Clean your hands and the bite area with rubbing alcohol or soap and water. Pregnant women should take special care to avoid tick bites because the infection can be passed along to the fetus. Contact a health care provider if:  You have symptoms after   treatment.  You have removed a tick and want to bring it to your health care provider for testing. Get help right away if:  You have an irregular heartbeat.  You have nerve pain.  Your face feels numb. This information is not intended to replace advice given to you by your health care provider. Make sure you discuss any questions you have with your health care provider. Document Released: 12/27/2000 Document  Revised: 05/11/2016 Document Reviewed: 05/11/2016 Elsevier Interactive Patient Education  2017 [employer].  

## 2017-02-07 NOTE — Progress Notes (Signed)
BP 130/89   Pulse 74   Temp 97.6 F (36.4 C) (Oral)   Ht 6' (1.829 m)   Wt 152 lb 9.6 oz (69.2 kg)   BMI 20.70 kg/m    Subjective:    Patient ID: Garrett Lin, male    DOB: Dec 09, 1960, 56 y.o.   MRN: [id number]  HPI: Garrett Lin is a 56 y.o. male presenting on 02/07/2017 for Tick Removal (left hip)  .found tick on his left lower abdomen near hip last night. Tried to get out but not successful. Denies fever or chills. No rash, headache. No NVD.  Relevant past medical, surgical, family and social history reviewed and updated as indicated. Allergies and medications reviewed and updated.  Past Medical History:  Diagnosis Date  . Chronic pain    right forearm.  . Irritable bowel syndrome (IBS) 2006, 2007   "nervous stomach" abdominal pain  . Small bowel obstruction due to adhesions (HCC) 09/02/2013    Past Surgical History:  Procedure Laterality Date  . COLONOSCOPY  2006   Carman ChingJames Edwards MD  . ESOPHAGOGASTRODUODENOSCOPY  2006   Raymondo BandJames Edwwards MD  . LAPAROSCOPIC LYSIS OF ADHESIONS N/A 09/02/2013   Procedure: LAPAROSCOPIC LYSIS OF ADHESIONS ;  Surgeon: Ernestene MentionHaywood M Ingram, MD;  [facility];  Service: General;  Laterality: N/A;  . SHOULDER ARTHROSCOPY Right 04/2014   Dr. Ranell PatrickNorris  . surgery for right forarm trauma     work related machine injury    Review of Systems  Constitutional: Negative.  Negative for appetite change and fatigue.  HENT: Negative.   Eyes: Negative.  Negative for pain and visual disturbance.  Respiratory: Negative.  Negative for cough, chest tightness, shortness of breath and wheezing.   Cardiovascular: Negative.  Negative for chest pain, palpitations and leg swelling.  Gastrointestinal: Negative.  Negative for abdominal pain, diarrhea, nausea and vomiting.  Endocrine: Negative.   Genitourinary: Negative.   Musculoskeletal: Negative.   Skin: Positive for color change and wound. Negative for rash.  Neurological: Negative.  Negative for weakness,  numbness and headaches.  Psychiatric/Behavioral: Negative.     Allergies as of 02/07/2017   No Known Allergies     Medication List       Accurate as of 02/07/17  9:05 AM. Always use your most recent med list.          doxycycline 100 MG tablet Commonly known as:  VIBRA-TABS Take 1 tablet (100 mg total) by mouth 2 (two) times daily. 1 po bid   ondansetron 4 MG disintegrating tablet Commonly known as:  ZOFRAN ODT Take 1 tablet (4 mg total) by mouth every 8 (eight) hours as needed for nausea or vomiting.          Objective:    BP 130/89   Pulse 74   Temp 97.6 F (36.4 C) (Oral)   Ht 6' (1.829 m)   Wt 152 lb 9.6 oz (69.2 kg)   BMI 20.70 kg/m   No Known Allergies  Physical Exam  Constitutional: He appears well-developed and well-nourished. No distress.  HENT:  Head: Normocephalic and atraumatic.  Eyes: Conjunctivae and EOM are normal. Pupils are equal, round, and reactive to light.  Cardiovascular: Normal rate, regular rhythm and normal heart sounds.   Pulmonary/Chest: Effort normal and breath sounds normal. No respiratory distress.  Skin: Skin is warm and dry. Lesion noted. There is erythema.     Lone star tick in place , removed from abdomen. No erythema or drainage  Psychiatric: He has  a normal mood and affect. His behavior is normal.  Nursing note and vitals reviewed.       Assessment & Plan:   1. Tick bite of abdomen, initial encounter LONE STAR tick removed from left lower abdomen Wound care Doxycycline if wound worsens   Current Outpatient Prescriptions:  .  doxycycline (VIBRA-TABS) 100 MG tablet, Take 1 tablet (100 mg total) by mouth 2 (two) times daily. 1 po bid, Disp: 20 tablet, Rfl: 0 .  ondansetron (ZOFRAN ODT) 4 MG disintegrating tablet, Take 1 tablet (4 mg total) by mouth every 8 (eight) hours as needed for nausea or vomiting., Disp: 20 tablet, Rfl: 0  Continue all other maintenance medications as listed above.  Follow up plan: Return if  symptoms worsen or fail to improve.  Educational handout given for tick bite  Remus Loffler PA-C [facility] [address]  [place], [place] [address] [phone]   02/07/2017, 9:05 AM

## 2017-02-08 ENCOUNTER — Ambulatory Visit: Payer: BLUE CROSS/BLUE SHIELD | Admitting: Family

## 2017-02-08 NOTE — Telephone Encounter (Signed)
Patient has had two office visits since this request.

## 2017-02-10 LAB — CDIFF NAA+O+P+STOOL CULTURE
E coli, Shiga toxin Assay: NEGATIVE
Toxigenic C. Difficile by PCR: NEGATIVE

## 2017-02-10 LAB — SPECIMEN STATUS REPORT

## 2017-06-23 ENCOUNTER — Other Ambulatory Visit: Payer: Self-pay

## 2017-06-23 DIAGNOSIS — K769 Liver disease, unspecified: Secondary | ICD-10-CM

## 2017-06-29 ENCOUNTER — Other Ambulatory Visit: Payer: Self-pay | Admitting: Family

## 2017-10-18 ENCOUNTER — Encounter: Payer: Self-pay | Admitting: Family

## 2017-10-18 ENCOUNTER — Ambulatory Visit: Payer: BLUE CROSS/BLUE SHIELD | Admitting: Family

## 2017-10-18 VITALS — BP 128/87 | HR 72 | Temp 97.3°F | Ht 72.0 in | Wt 160.2 lb

## 2017-10-18 DIAGNOSIS — M79641 Pain in right hand: Secondary | ICD-10-CM

## 2017-10-18 DIAGNOSIS — Z0289 Encounter for other administrative examinations: Secondary | ICD-10-CM

## 2017-10-18 NOTE — Progress Notes (Signed)
   Subjective:    Patient ID: Seward GraterRoger Cuffee, male    DOB: Jan 08, 1961, 57 y.o.   MRN: [id number]  HPI  Pt presents to the office today with recurrent right wrist and right gluteal pain. Pt states he was rolling skating on 10/12/17 and went to the Urgent Care on 10/13/17 and had negative x-ray.  Pt states his "butt is getting better", but still can not move his right wrist without "a lot of pain".  He reports 2-3 out 10 in his wrist when resting, but 6-7 out 10 when he moves it.    Review of Systems  All other systems reviewed and are negative.      Objective:   Physical Exam  Constitutional: He is oriented to person, place, and time. He appears well-developed and well-nourished. No distress.  HENT:  Head: Normocephalic.  Eyes: Pupils are equal, round, and reactive to light. Right eye exhibits no discharge. Left eye exhibits no discharge.  Neck: Normal range of motion. Neck supple. No thyromegaly present.  Cardiovascular: Normal rate, regular rhythm, normal heart sounds and intact distal pulses.  No murmur heard. Pulmonary/Chest: Effort normal and breath sounds normal. No respiratory distress. He has no wheezes.  Abdominal: Soft. Bowel sounds are normal. He exhibits no distension. There is no tenderness.  Musculoskeletal: He exhibits edema and tenderness.  2+ edema in right hand, unable to flexion or extend hand, pain with any  movement  Neurological: He is alert and oriented to person, place, and time.  Skin: Skin is warm and dry. No rash noted. No erythema.  Psychiatric: He has a normal mood and affect. His behavior is normal. Judgment and thought content normal.  Vitals reviewed.   BP 128/87   Pulse 72   Temp (!) 97.3 F (36.3 C) (Oral)   Ht 6' (1.829 m)   Wt 160 lb 3.2 oz (72.7 kg)   BMI 21.73 kg/m       Assessment & Plan:  1. Right hand pain - Ambulatory referral to Orthopedic Surgery  2. Fall from roller skates, subsequent encounter - Ambulatory referral to  Orthopedic Surgery  With patient's swelling and tenderness will send to ortho. I believe patient has a fracture that was missed on x-ray.  Will give pt note for work until he is cleared from Ortho [facility] [employer] prn   Jannifer Rodneyhristy Itzia Cunliffe, FNP

## 2017-10-18 NOTE — Patient Instructions (Signed)
Hand Pain  Many things can cause hand pain. Some common causes are:  ? An injury.  ? Repeating the same movement with your hand over and over (overuse).  ? Osteoporosis.  ? Arthritis.  ? Lumps in the tendons or joints of the hand and wrist (ganglion cysts).  ? Infection.  Follow these instructions at home:  Pay attention to any changes in your symptoms. Take these actions to help with your discomfort:  ? If directed, put ice on the affected area:  ? Put ice in a plastic bag.  ? Place a towel between your skin and the bag.  ? Leave the ice on for 15?20 minutes, 3?4 times a day for 2 days.  ? Take over-the-counter and prescription medicines only as told by your health care provider.  ? Minimize stress on your hands and wrists as much as possible.  ? Take breaks from repetitive activity often.  ? Do stretches as told by your health care provider.  ? Do not do activities that make your pain worse.  Contact a health care provider if:  ? Your pain does not get better after a few days of self-care.  ? Your pain gets worse.  ? Your pain affects your ability to do your daily activities.  Get help right away if:  ? Your hand becomes warm, red, or swollen.  ? Your hand is numb or tingling.  ? Your hand is extremely swollen or deformed.  ? Your hand or fingers turn white or blue.  ? You cannot move your hand, wrist, or fingers.  This information is not intended to replace advice given to you by your health care provider. Make sure you discuss any questions you have with your health care provider.  Document Released: 10/17/2015 Document Revised: 02/26/2016 Document Reviewed: 10/16/2014  Elsevier Interactive Patient Education ? 2018 Elsevier Inc.

## 2017-10-19 ENCOUNTER — Encounter (INDEPENDENT_AMBULATORY_CARE_PROVIDER_SITE_OTHER): Payer: Self-pay | Admitting: Orthopaedic Surgery

## 2017-10-19 ENCOUNTER — Ambulatory Visit (INDEPENDENT_AMBULATORY_CARE_PROVIDER_SITE_OTHER): Payer: BLUE CROSS/BLUE SHIELD | Admitting: Orthopaedic Surgery

## 2017-10-19 VITALS — BP 128/87 | HR 72 | Ht 72.0 in | Wt 160.0 lb

## 2017-10-19 DIAGNOSIS — M25531 Pain in right wrist: Secondary | ICD-10-CM

## 2017-10-19 NOTE — Progress Notes (Signed)
Office Visit Note   Patient: Garrett Lin           Date of Birth: [birth year]           MRN: [id number] Visit Date: 10/19/2017              Requested by: Junie Spencer, FNP [address] [place], [place] [address] PCP: Junie Spencer, FNP   Assessment & Plan: Visit Diagnoses:  1. Pain in right wrist     Plan: volar wrist splint. Office in 2 weeks for reevaluation of possible repeat films.  Follow-Up Instructions: Return in about 2 weeks (around 11/02/2017).   Orders:  No orders of the defined types were placed in this encounter.  No orders of the defined types were placed in this encounter.     Procedures: No procedures performed   Clinical Data: No additional findings.   Subjective: Chief Complaint  Patient presents with  . Right Hand - Pain  Patient presents with a right hand injury after he fell skating on 10/12/2017.  He states that he hyperextended his wrist. He does have a previous fracture of that wrist. He describes pain across the wrist, up into the arm, and down into the hand. He was taking hydrocodone, using ice, and wearing a velcro splint.  He is currently taking Ibuprofen 800mg . He has had x-rays made that are available on [employer].  Significant injury to right upper extremity in 2011 requiring multiple surgeries by Dr. Nedra Hai in [place]. Has had prior excision of the trapezium bone. Was doing well until he was rollerskating a week ago when he fell and tried to break his fall by putting his right upper extremity "backwards". Since that time he's had a problem. I did review films of his right wrist on the PACS system dated 10/13/2017. I don't see any acute changes particularly about the radiocarpal joint. There is no evidence of a fracture of the distal radius or ulna. There is evidence of a prior excision of the trapezium HPI  Review of Systems   Objective: Vital Signs: BP 128/87   Pulse 72   Ht 6' (1.829 m)   Wt 160 lb (72.6 kg)   BMI  21.70 kg/m   Physical Exam  Ortho Exam awake alert and oriented 3 comfortable sitting. Exam limited to the right upper extremity. Neurovascular exam intact. Pain seems to be localized along the radiocarpal joint with there is mild swelling but no erythema. rotation. All skin graft and scarring along the volar aspect of the forearm which is unchanged. No numbness or tingling. Good capillary refill to all fingers has limited pronation supination as well as flexion and extension based on his recent injury  No specialty comments available.  Imaging: No results found.   PMFS History: Patient Active Problem List   Diagnosis Date Noted  . Current smoker 02/04/2017   Past Medical History:  Diagnosis Date  . Chronic pain    right forearm.  . Irritable bowel syndrome (IBS) 2006, 2007   "nervous stomach" abdominal pain  . Small bowel obstruction due to adhesions ([facility]) 09/02/2013    History reviewed. No pertinent family history.  Past Surgical History:  Procedure Laterality Date  . COLONOSCOPY  2006   Carman Ching MD  . ESOPHAGOGASTRODUODENOSCOPY  2006   Raymondo Band MD  . LAPAROSCOPIC LYSIS OF ADHESIONS N/A 09/02/2013   Procedure: LAPAROSCOPIC LYSIS OF ADHESIONS ;  Surgeon: [name], MD;  Location: [facility];  Service: General;  Laterality: N/A;  . SHOULDER ARTHROSCOPY Right 04/2014   Dr. Ranell PatrickNorris  . surgery for right forarm trauma     work related machine injury   Social History   Occupational History  . Not on file  Tobacco Use  . Smoking status: Current Every Day Smoker    Packs/day: 1.00    Years: 38.00    Pack years: 38.00    Types: Cigarettes  . Smokeless tobacco: Never Used  Substance and Sexual Activity  . Alcohol use: No  . Drug use: No  . Sexual activity: Not on file

## 2017-10-24 ENCOUNTER — Ambulatory Visit: Payer: BLUE CROSS/BLUE SHIELD | Admitting: Family

## 2017-10-24 ENCOUNTER — Encounter: Payer: Self-pay | Admitting: Family

## 2017-10-24 VITALS — BP 128/84 | HR 73 | Temp 97.2°F | Ht 72.0 in | Wt 157.8 lb

## 2017-10-24 DIAGNOSIS — F17200 Nicotine dependence, unspecified, uncomplicated: Secondary | ICD-10-CM

## 2017-10-24 DIAGNOSIS — M25531 Pain in right wrist: Secondary | ICD-10-CM | POA: Diagnosis not present

## 2017-10-24 MED ORDER — IBUPROFEN 800 MG PO TABS: 800.0000 mg | ORAL_TABLET | Freq: Three times a day (TID) | ORAL | 3 refills | Status: DC | PRN

## 2017-10-24 NOTE — Progress Notes (Signed)
   Subjective:    Patient ID: Garrett Lin, male    DOB: 05-Jan-1961, 57 y.o.   MRN: [id number]  HPI Pt presents to the office today to recheck right hand swelling and bruising. PT fell 10/12/17 while skating and had negative x-ray on his right wrist, but continues to have intermittent pain and decrease ROM. PT was seen in the office on 10/18/17 and referred to Ortho.  PT states Ortho told it will just time to heal.    Review of Systems  All other systems reviewed and are negative.      Objective:   Physical Exam  Constitutional: He is oriented to person, place, and time. He appears well-developed and well-nourished. No distress.  HENT:  Head: Normocephalic.  Eyes: Pupils are equal, round, and reactive to light. Right eye exhibits no discharge. Left eye exhibits no discharge.  Neck: Normal range of motion. Neck supple. No thyromegaly present.  Cardiovascular: Normal rate, regular rhythm, normal heart sounds and intact distal pulses.  No murmur heard. Pulmonary/Chest: Effort normal and breath sounds normal. No respiratory distress. He has no wheezes.  Abdominal: Soft. Bowel sounds are normal. He exhibits no distension. There is no tenderness.  Musculoskeletal: Normal range of motion. He exhibits tenderness (tenderness in right wrist with rotation ). He exhibits no edema.  Neurological: He is alert and oriented to person, place, and time.  Skin: Skin is warm and dry. No rash noted. No erythema.  Psychiatric: He has a normal mood and affect. His behavior is normal. Judgment and thought content normal.  Vitals reviewed.    BP 128/84   Pulse 73   Temp (!) 97.2 F (36.2 C) (Oral)   Ht 6' (1.829 m)   Wt 157 lb 12.8 oz (71.6 kg)   BMI 21.40 kg/m      Assessment & Plan:  1. Right wrist pain - ibuprofen (ADVIL,MOTRIN) 800 MG tablet; Take 1 tablet (800 mg total) by mouth every 8 (eight) hours as needed.  Dispense: 60 tablet; Refill: 3  2. Fall from roller skates, sequela -  ibuprofen (ADVIL,MOTRIN) 800 MG tablet; Take 1 tablet (800 mg total) by mouth every 8 (eight) hours as needed.  Dispense: 60 tablet; Refill: 3  3. Current smoker Smoking cessation discussed   Will refill Motrin today Continue to wear brace Will give note to stay out of work for 3 weeks, if pain improves patient may return to work earlier  [employer] prn     Jannifer Rodneyhristy Folashade Gamboa, FNP

## 2017-10-24 NOTE — Patient Instructions (Signed)
Wrist Sprain, Adult A wrist sprain is a stretch or tear in the strong, fibrous tissues (ligaments) that connect your wrist bones. There are three types of wrist sprains:  Grade 1. In this type of sprain, the ligament is stretched more than normal.  Grade 2. In this type of sprain, the ligament is partially torn. You may be able to move your wrist, but not very much.  Grade 3. In this type of sprain, the ligament or muscle is completely torn. You may find it difficult or extremely painful to move your wrist even a little.  What are the causes? A wrist sprain can be caused by using the wrist too much during sports, exercise, or at work. It can also happen with a fall or during an accident. What increases the risk? This condition is more likely to occur in people:  With a previous wrist or arm injury.  With poor wrist strength and flexibility.  Who play contact sports, such as football or soccer.  Who play sports that may result in a fall, such as skateboarding, biking, skiing, or snowboarding.  Who do not exercise regularly.  Who use exercise equipment that does not fit well.  What are the signs or symptoms? Symptoms of this condition include:  Pain in the wrist, arm, or hand.  Swelling or bruised skin near the wrist, hand, or arm. The skin may look yellow or kind of blue.  Stiffness or trouble moving the hand.  Hearing a pop or feeling a tear at the time of the injury.  A warm feeling in the skin around the wrist.  How is this diagnosed? This condition is diagnosed with a physical exam. Sometimes an X-ray is taken to make sure a bone did not break. If your health care provider thinks that you tore a ligament, he or she may order an MRI of your wrist. How is this treated? This condition is treated by resting and applying ice to your wrist. Additional treatment may include:  Medicine for pain and inflammation.  A splint to keep your wrist still (immobilized).  Exercises  to strengthen and stretch your wrist.  Surgery. This may be done if the ligament is completely torn.  Follow these instructions at home: If you have a splint:   Do not put pressure on any part of the splint until it is fully hardened. This may take several hours.  Wear the splint as told by your health care provider. Remove it only as told by your health care provider.  Loosen the splint if your fingers tingle, become numb, or turn cold and blue.  If your splint is not waterproof: ? Do not let it get wet. ? Cover it with a watertight covering when you take a bath or a shower.  Keep the splint clean. Managing pain, stiffness, and swelling   If directed, put ice on the injured area. ? If you have a removable splint, remove it as told by your health care provider. ? Put ice in a plastic bag. ? Place a towel between your skin and the bag or between the splint and the bag. ? Leave the ice on for 20 minutes, 2-3 times per day.  Move your fingers often to avoid stiffness and to lessen swelling.  Raise (elevate) the injured area above the level of your heart while you are sitting or lying down. Activity  Rest your wrist. Do not do things that cause pain.  Return to your normal activities as told by   your health care provider. Ask your health care provider what activities are safe for you.  Do exercises as told by your health care provider. General instructions  Take over-the-counter and prescription medicines only as told by your health care provider.  Do not use any products that contain nicotine or tobacco, such as cigarettes and e-cigarettes. These can delay healing. If you need help quitting, ask your health care provider.  Ask your health care provider when it is safe to drive if you have a splint.  Keep all follow-up visits as told by your health care provider. This is important. Contact a health care provider if:  Your pain, bruising, or swelling gets worse.  Your  skin becomes red, gets a rash, or has open sores.  Your pain does not get better or it gets worse. Get help right away if:  You have a new or sudden sharp pain in the hand, arm, or wrist.  You have tingling or numbness in your hand.  Your fingers turn white, very red, or cold and blue.  You cannot move your fingers. This information is not intended to replace advice given to you by your health care provider. Make sure you discuss any questions you have with your health care provider. Document Released: 05/24/2014 Document Revised: 04/17/2016 Document Reviewed: 04/08/2016 Elsevier Interactive Patient Education  2018 Elsevier Inc.  

## 2018-10-17 ENCOUNTER — Encounter: Payer: Self-pay | Admitting: Family Medicine

## 2018-10-17 ENCOUNTER — Ambulatory Visit: Payer: Self-pay | Admitting: Family Medicine

## 2018-10-17 VITALS — BP 131/82 | HR 85 | Temp 97.1°F | Ht 72.0 in | Wt 150.0 lb

## 2018-10-17 DIAGNOSIS — K5792 Diverticulitis of intestine, part unspecified, without perforation or abscess without bleeding: Secondary | ICD-10-CM

## 2018-10-17 MED ORDER — CIPROFLOXACIN HCL 500 MG PO TABS: 500.0000 mg | ORAL_TABLET | Freq: Two times a day (BID) | ORAL | 0 refills | Status: AC

## 2018-10-17 MED ORDER — METRONIDAZOLE 500 MG PO TABS: 500.0000 mg | ORAL_TABLET | Freq: Three times a day (TID) | ORAL | 0 refills | Status: AC

## 2018-10-17 NOTE — Progress Notes (Signed)
    Subjective:     Garrett Lin is a 58 y.o. male who presents for evaluation of abdominal pain. Onset was 3 days ago. Symptoms have been gradually worsening. The pain is described as cramping, sharp and shooting, and is 8/10 in intensity. Pain is located in the LLQ and suprapubic region without radiation.  Aggravating factors: palpation and certain movements. States he has a history of diverticulosis. States he has not followed his diet over the last several days. States he has had salad, beef and broccoli, and nuts.  Alleviating factors: none. Associated symptoms: diarrhea. The patient denies chills, dysuria, fever, hematochezia, melena, nausea and weakness.   The patient's history has been marked as reviewed and updated as appropriate. allergies, current medications, past family history, past medical history, past social history, past surgical history and problem list  Review of Systems Constitutional: positive for fatigue Respiratory: negative Cardiovascular: negative Gastrointestinal: positive for abdominal pain, change in bowel habits and diarrhea Genitourinary:negative Musculoskeletal:negative Neurological: negative     Objective:    BP 131/82   Pulse 85   Temp (!) 97.1 F (36.2 C) (Oral)   Ht 6' (1.829 m)   Wt 150 lb (68 kg)   BMI 20.34 kg/m  General appearance: alert, cooperative and mild distress Head: Normocephalic, without obvious abnormality, atraumatic Throat: lips, mucosa, and tongue normal; teeth and gums normal Lungs: clear to auscultation bilaterally Heart: regular rate and rhythm, S1, S2 normal, no murmur, click, rub or gallop Abdomen: abnormal findings:  moderate tenderness in the LLQ and suprapubic area Pulses: 2+ and symmetric Skin: Skin color, texture, turgor normal. No rashes or lesions Neurologic: Grossly normal    Assessment:   Garrett Lin was seen today for abdominal pain.  Diagnoses and all orders for this visit:  Diverticulitis Due to previous  history, recent diet, and symptoms will treat for diverticulitis. Pt declines labs and imaging. States he will return in one week if not improving. Pt aware of signs and symptoms that warrant emergent evaluation. Medications as prescribed.  -     ciprofloxacin (CIPRO) 500 MG tablet; Take 1 tablet (500 mg total) by mouth 2 (two) times daily for 7 days. -     metroNIDAZOLE (FLAGYL) 500 MG tablet; Take 1 tablet (500 mg total) by mouth 3 (three) times daily for 7 days.    Plan:    The diagnosis was discussed with the patient and evaluation and treatment plans outlined. Adhere to simple, bland diet. Follow up in 1 week or as needed. Medications as prescribed.     The above assessment and management plan was discussed with the patient. The patient verbalized understanding of and has agreed to the management plan. Patient is aware to call the clinic if symptoms fail to improve or worsen. Patient is aware when to return to the clinic for a follow-up visit. Patient educated on when it is appropriate to go to the emergency department.   Kari Baars, FNP-C [facility] [address] [place], [place] [address] [phone]

## 2018-10-17 NOTE — Patient Instructions (Signed)

## 2018-10-25 ENCOUNTER — Ambulatory Visit: Payer: Self-pay | Admitting: Family Medicine

## 2019-02-09 ENCOUNTER — Telehealth: Payer: Self-pay | Admitting: Family

## 2019-07-09 ENCOUNTER — Other Ambulatory Visit: Payer: Self-pay | Admitting: *Deleted

## 2019-07-09 DIAGNOSIS — Z20822 Contact with and (suspected) exposure to covid-19: Secondary | ICD-10-CM

## 2019-07-10 LAB — NOVEL CORONAVIRUS, NAA: SARS-CoV-2, NAA: NOT DETECTED

## 2021-05-11 ENCOUNTER — Telehealth: Payer: Self-pay | Admitting: Family

## 2021-05-11 NOTE — Telephone Encounter (Signed)
I've never met him, but sure. I am okay with that. 

## 2021-05-11 NOTE — Telephone Encounter (Signed)
This is fine with me, I have only seen patient for acute issues three years ago.

## 2021-05-12 NOTE — Telephone Encounter (Signed)
Lmtcb.

## 2021-05-12 NOTE — Telephone Encounter (Signed)
Attempted to contact patient- mailbox full  

## 2021-05-13 ENCOUNTER — Ambulatory Visit (INDEPENDENT_AMBULATORY_CARE_PROVIDER_SITE_OTHER): Payer: Managed Care, Other (non HMO) | Admitting: Family Medicine

## 2021-05-13 ENCOUNTER — Other Ambulatory Visit: Payer: Self-pay

## 2021-05-13 ENCOUNTER — Encounter: Payer: Self-pay | Admitting: Family Medicine

## 2021-05-13 VITALS — BP 140/90 | HR 81 | Temp 98.1°F | Ht 73.0 in | Wt 138.4 lb

## 2021-05-13 DIAGNOSIS — K769 Liver disease, unspecified: Secondary | ICD-10-CM

## 2021-05-13 DIAGNOSIS — I700 Atherosclerosis of aorta: Secondary | ICD-10-CM | POA: Diagnosis not present

## 2021-05-13 DIAGNOSIS — M2550 Pain in unspecified joint: Secondary | ICD-10-CM | POA: Diagnosis not present

## 2021-05-13 DIAGNOSIS — Z1159 Encounter for screening for other viral diseases: Secondary | ICD-10-CM

## 2021-05-13 DIAGNOSIS — N400 Enlarged prostate without lower urinary tract symptoms: Secondary | ICD-10-CM

## 2021-05-13 DIAGNOSIS — R252 Cramp and spasm: Secondary | ICD-10-CM

## 2021-05-13 DIAGNOSIS — Z114 Encounter for screening for human immunodeficiency virus [HIV]: Secondary | ICD-10-CM

## 2021-05-13 DIAGNOSIS — R634 Abnormal weight loss: Secondary | ICD-10-CM

## 2021-05-13 MED ORDER — ROSUVASTATIN CALCIUM 5 MG PO TABS: 5.0000 mg | ORAL_TABLET | Freq: Every day | ORAL | 2 refills | Status: DC

## 2021-05-13 NOTE — Progress Notes (Signed)
Assessment & Plan:  1. Polyarthralgia Labs to assess for a cause. - CBC with Differential/Platelet - CMP14+EGFR - TSH  2. Leg cramps Labs to assess for a cause. - CBC with Differential/Platelet - CMP14+EGFR - TSH - Magnesium  3. Liver lesion, right lobe - MR ABDOMEN W CONTRAST; Future  4. Aortic atherosclerosis ([place]) Started on statin. - Lipid panel - rosuvastatin (CRESTOR) 5 MG tablet; Take 1 tablet (5 mg total) by mouth daily.  Dispense: 30 tablet; Refill: 2  5. Enlarged prostate - PSA, total and free  6. Unexplained weight loss - CBC with Differential/Platelet - CMP14+EGFR - TSH  7. Encounter for hepatitis C screening test for low risk patient - Hepatitis C antibody (reflex, frozen specimen)  8. Encounter for screening for HIV - HIV antibody (with reflex)   Return as directed after labs result.  Hendricks Limes, MSN, APRN, FNP-C [facility]  Subjective:    Patient ID: Garrett Lin, male    DOB: 15-Jul-1961, 60 y.o.   MRN: [id number]  Patient Care Team: Loman Brooklyn, FNP as PCP - General (Family Medicine)   Chief Complaint:  Chief Complaint  Patient presents with   feels dehydrated    X 1 month    Weight Loss    X 1 month     HPI: Garrett Lin is a 59 y.o. male presenting on 05/13/2021 for feels dehydrated (X 1 month ) and Weight Loss (X 1 month )  Patient reports he "feels dehydrated" because his tendons feel tight, he has leg cramps, and his stomach feel weird. This has been going on for the past month. He works in the heat 5 days/week. Also states he has lost >10 lbs over the past couple of months. He is only eating one meal per day due to the heat and a decreased appetite. States he does drink some water and Gatorade, but mostly drinks [employer] and sweet tea.   Upon chart review patient had a CT scan in 2018 that revealed an enlarged prostate and new liver lesion on the right lobe that the patient never followed  up on. He does report a slow/weaker urinary stream first thing in the morning.  CT scan also showed aortic atherosclerosis that he is not taking a statin for.    Social history:  Relevant past medical, surgical, family and social history reviewed and updated as indicated. Interim medical history since our last visit reviewed.  Allergies and medications reviewed and updated.  DATA REVIEWED: CHART IN EPIC  ROS: Negative unless specifically indicated above in HPI.   No current outpatient medications on file.   No Known Allergies Past Medical History:  Diagnosis Date   Chronic pain    right forearm.   Irritable bowel syndrome (IBS) 2006, 2007   "nervous stomach" abdominal pain   Small bowel obstruction due to adhesions ([place]) 09/02/2013    Past Surgical History:  Procedure Laterality Date   COLONOSCOPY  2006   Laurence Spates MD   ESOPHAGOGASTRODUODENOSCOPY  2006   Darcus Pester MD   LAPAROSCOPIC LYSIS OF ADHESIONS N/A 09/02/2013   Procedure: LAPAROSCOPIC LYSIS OF ADHESIONS ;  Surgeon: Adin Hector, MD;  Location: [facility];  Service: General;  Laterality: N/A;   SHOULDER ARTHROSCOPY Right 04/2014   Dr. Veverly Fells   surgery for right forarm trauma     work related machine injury    Social History   Socioeconomic History   Marital status: Married  Spouse name: Not on file   Number of children: Not on file   Years of education: Not on file   Highest education level: Not on file  Occupational History   Not on file  Tobacco Use   Smoking status: Every Day    Packs/day: 1.00    Years: 38.00    Pack years: 38.00    Types: Cigarettes   Smokeless tobacco: Never  Vaping Use   Vaping Use: Never used  Substance and Sexual Activity   Alcohol use: No   Drug use: No   Sexual activity: Not on file  Other Topics Concern   Not on file  Social History Narrative   Not on file   Social Determinants of Health   Financial Resource Strain: Not on file  Food Insecurity: Not on  file  Transportation Needs: Not on file  Physical Activity: Not on file  Stress: Not on file  Social Connections: Not on file  Intimate Partner Violence: Not on file        Objective:    BP 140/90   Pulse 81   Temp 98.1 F (36.7 C) (Temporal)   Ht _0  (1.854 m)   Wt 138 lb 6.4 oz (62.8 kg)   SpO2 97%   BMI 18.26 kg/m   Wt Readings from Last 3 Encounters:  05/13/21 138 lb 6.4 oz (62.8 kg)  10/17/18 150 lb (68 kg)  10/24/17 157 lb 12.8 oz (71.6 kg)    Physical Exam  No results found for: TSH Lab Results  Component Value Date   WBC 10.7 02/04/2017   HGB 15.8 02/04/2017   HCT 47.0 02/04/2017   MCV 95 02/04/2017   PLT 310 02/04/2017   Lab Results  Component Value Date   NA 141 02/04/2017   K 4.5 02/04/2017   CO2 23 02/04/2017   GLUCOSE 95 02/04/2017   BUN 10 02/04/2017   CREATININE 0.85 02/04/2017   BILITOT 0.3 02/04/2017   ALKPHOS 80 02/04/2017   AST 28 02/04/2017   ALT 37 02/04/2017   PROT 6.4 02/04/2017   ALBUMIN 4.0 02/04/2017   CALCIUM 9.4 02/04/2017   ANIONGAP 11 12/19/2014   Lab Results  Component Value Date   CHOL 169 09/22/2015   Lab Results  Component Value Date   HDL 47 09/22/2015   Lab Results  Component Value Date   LDLCALC 99 09/22/2015   Lab Results  Component Value Date   TRIG 115 09/22/2015   Lab Results  Component Value Date   CHOLHDL 3.6 09/22/2015   No results found for: HGBA1C

## 2021-05-13 NOTE — Patient Instructions (Signed)
You need to strongly consider having a colonoscopy.  I have ordered a scan for your liver. Garrett Lin will call you to schedule.

## 2021-05-14 LAB — CMP14+EGFR
ALT: 24 IU/L (ref 0–44)
AST: 28 IU/L (ref 0–40)
Albumin/Globulin Ratio: 2.2 (ref 1.2–2.2)
Albumin: 4.8 g/dL (ref 3.8–4.9)
Alkaline Phosphatase: 105 IU/L (ref 44–121)
BUN/Creatinine Ratio: 18 (ref 9–20)
BUN: 18 mg/dL (ref 6–24)
Bilirubin Total: 0.7 mg/dL (ref 0.0–1.2)
CO2: 24 mmol/L (ref 20–29)
Calcium: 10.2 mg/dL (ref 8.7–10.2)
Chloride: 101 mmol/L (ref 96–106)
Creatinine, Ser: 1.02 mg/dL (ref 0.76–1.27)
Globulin, Total: 2.2 g/dL (ref 1.5–4.5)
Glucose: 90 mg/dL (ref 65–99)
Potassium: 5.2 mmol/L (ref 3.5–5.2)
Sodium: 138 mmol/L (ref 134–144)
Total Protein: 7 g/dL (ref 6.0–8.5)
eGFR: 86 mL/min/{1.73_m2} (ref >59)

## 2021-05-14 LAB — CBC WITH DIFFERENTIAL/PLATELET
Basophils Absolute: 0.1 10*3/uL (ref 0.0–0.2)
Basos: 1 %
EOS (ABSOLUTE): 0.2 10*3/uL (ref 0.0–0.4)
Eos: 2 %
Hematocrit: 50.8 % (ref 37.5–51.0)
Hemoglobin: 17.3 g/dL (ref 13.0–17.7)
Immature Grans (Abs): 0 10*3/uL (ref 0.0–0.1)
Immature Granulocytes: 0 %
Lymphocytes Absolute: 2 10*3/uL (ref 0.7–3.1)
Lymphs: 26 %
MCH: 31.7 pg (ref 26.6–33.0)
MCHC: 34.1 g/dL (ref 31.5–35.7)
MCV: 93 fL (ref 79–97)
Monocytes Absolute: 0.6 10*3/uL (ref 0.1–0.9)
Monocytes: 8 %
Neutrophils Absolute: 4.9 10*3/uL (ref 1.4–7.0)
Neutrophils: 63 %
Platelets: 325 10*3/uL (ref 150–450)
RBC: 5.45 x10E6/uL (ref 4.14–5.80)
RDW: 12.1 % (ref 11.6–15.4)
WBC: 7.7 10*3/uL (ref 3.4–10.8)

## 2021-05-14 LAB — LIPID PANEL
Chol/HDL Ratio: 4.1 ratio (ref 0.0–5.0)
Cholesterol, Total: 198 mg/dL (ref 100–199)
HDL: 48 mg/dL (ref >39)
LDL Chol Calc (NIH): 132 mg/dL — ABNORMAL HIGH (ref 0–99)
Triglycerides: 101 mg/dL (ref 0–149)
VLDL Cholesterol Cal: 18 mg/dL (ref 5–40)

## 2021-05-14 LAB — TSH: TSH: 1.79 u[IU]/mL (ref 0.450–4.500)

## 2021-05-14 LAB — MAGNESIUM: Magnesium: 2.3 mg/dL (ref 1.6–2.3)

## 2021-05-14 LAB — PSA, TOTAL AND FREE
PSA, Free Pct: 40 %
PSA, Free: 0.52 ng/mL
Prostate Specific Ag, Serum: 1.3 ng/mL (ref 0.0–4.0)

## 2021-05-14 LAB — HIV ANTIBODY (ROUTINE TESTING W REFLEX): HIV Screen 4th Generation wRfx: NONREACTIVE

## 2021-05-14 LAB — HCV AB W REFLEX TO QUANT PCR: HCV Ab: <0.1 s/co ratio (ref 0.0–0.9)

## 2021-05-14 LAB — HCV INTERPRETATION

## 2021-05-18 ENCOUNTER — Other Ambulatory Visit: Payer: Self-pay | Admitting: Family Medicine

## 2021-05-18 DIAGNOSIS — K769 Liver disease, unspecified: Secondary | ICD-10-CM

## 2021-05-19 ENCOUNTER — Telehealth: Payer: Self-pay | Admitting: Family Medicine

## 2021-05-19 DIAGNOSIS — K769 Liver disease, unspecified: Secondary | ICD-10-CM

## 2021-05-19 NOTE — Telephone Encounter (Signed)
MRI order changed from w contrast to w wo contrast as requested.

## 2021-05-20 ENCOUNTER — Other Ambulatory Visit: Payer: Self-pay | Admitting: Family Medicine

## 2021-05-20 DIAGNOSIS — M2550 Pain in unspecified joint: Secondary | ICD-10-CM

## 2021-05-20 MED ORDER — MELOXICAM 7.5 MG PO TABS
7.5000 mg | ORAL_TABLET | Freq: Every day | ORAL | 2 refills | Status: DC
Start: 2021-05-20 — End: 2023-01-07

## 2021-06-10 ENCOUNTER — Telehealth: Payer: Self-pay | Admitting: Family Medicine

## 2021-06-10 DIAGNOSIS — Z1211 Encounter for screening for malignant neoplasm of colon: Secondary | ICD-10-CM

## 2021-06-10 NOTE — Telephone Encounter (Signed)
Referral placed. Please let patient know if he does not hear anything within a week to let us know so we can look into why.

## 2021-06-10 NOTE — Telephone Encounter (Signed)
REFERRAL REQUEST Telephone Note  Have you been seen at our office for this problem? YES (Advise that they may need an appointment with their PCP before a referral can be done)  Reason for Referral: COLONOSCOPY, pt states that Britney wants him to get this done  Referral discussed with patient: Yes Best contact number of patient for referral team:   [phone] Has patient been seen by a specialist for this issue before: no Patient provider preference for referral: no preference Patient location preference for referral: no preference   Patient notified that referrals can take up to a week or longer to process. If they haven't heard anything within a week they should call back and speak with the referral department.

## 2021-06-10 NOTE — Telephone Encounter (Signed)
Patient aware and verbalizes.  ?

## 2021-06-11 ENCOUNTER — Other Ambulatory Visit: Payer: Self-pay

## 2021-06-11 ENCOUNTER — Ambulatory Visit (HOSPITAL_COMMUNITY)
Admission: RE | Admit: 2021-06-11 | Discharge: 2021-06-11 | Disposition: A | Discharge status: Home / Self Care | Payer: Managed Care, Other (non HMO) | Source: Ambulatory Visit | Attending: Family Medicine | Admitting: Family Medicine

## 2021-06-11 DIAGNOSIS — K769 Liver disease, unspecified: Secondary | ICD-10-CM | POA: Diagnosis present

## 2021-06-11 MED ORDER — GADOBUTROL 1 MMOL/ML IV SOLN
10.0000 mL | Freq: Once | INTRAVENOUS | Status: AC | PRN
  Administered 2021-06-11: 10 mL via INTRAVENOUS

## 2021-06-12 ENCOUNTER — Encounter: Payer: Self-pay | Admitting: Family Medicine

## 2021-06-12 ENCOUNTER — Encounter: Payer: Self-pay | Admitting: Internal Medicine

## 2021-06-14 ENCOUNTER — Other Ambulatory Visit: Payer: Self-pay | Admitting: Family Medicine

## 2021-07-03 ENCOUNTER — Ambulatory Visit (HOSPITAL_COMMUNITY): Payer: Managed Care, Other (non HMO) | Admitting: Hematology and Oncology

## 2021-07-16 ENCOUNTER — Ambulatory Visit (INDEPENDENT_AMBULATORY_CARE_PROVIDER_SITE_OTHER): Payer: Self-pay | Admitting: *Deleted

## 2021-07-16 ENCOUNTER — Encounter: Payer: Self-pay | Admitting: *Deleted

## 2021-07-16 ENCOUNTER — Other Ambulatory Visit: Payer: Self-pay

## 2021-07-16 VITALS — Ht 71.0 in | Wt 146.4 lb

## 2021-07-16 DIAGNOSIS — Z1211 Encounter for screening for malignant neoplasm of colon: Secondary | ICD-10-CM

## 2021-07-16 MED ORDER — CLENPIQ 10-3.5-12 MG-GM -GM/160ML PO SOLN: 1.0000 | Freq: Once | ORAL | 0 refills | Status: AC

## 2021-07-16 NOTE — Progress Notes (Signed)
Ok to schedule.  ASA II 

## 2021-07-16 NOTE — Progress Notes (Signed)
Gastroenterology Pre-Procedure Review  Request Date: 07/16/2021 Requesting Physician: Deliah Boston, FNP @ [facility], Last TCS done 2006 by Dr. Carman Ching, pt unsure of findings but thinks he had non-cancerous polyps  PATIENT REVIEW QUESTIONS: The patient responded to the following health history questions as indicated:    1. Diabetes Melitis: no 2. Joint replacements in the past 12 months: no 3. Major health problems in the past 3 months: no 4. Has an artificial valve or MVP: no 5. Has a defibrillator: no 6. Has been advised in past to take antibiotics in advance of a procedure like teeth cleaning: no 7. Family history of colon cancer: no  8. Alcohol Use: no 9. Illicit drug Use: no 10. History of sleep apnea: no 11. History of coronary artery or other vascular stents placed within the last 12 months: no 12. History of any prior anesthesia complications: no 13. Body mass index is 20.42 kg/m.    MEDICATIONS & ALLERGIES:    Patient reports the following regarding taking any blood thinners:   Plavix? no Aspirin? no Coumadin? no Brilinta? no Xarelto? no Eliquis? no Pradaxa? no Savaysa? no Effient? no  Patient confirms/reports the following medications:  Current Outpatient Medications  Medication Sig Dispense Refill   Aspirin-Acetaminophen-Caffeine (GOODY HEADACHE PO) Take by mouth as needed.     meloxicam (MOBIC) 7.5 MG tablet Take 1 tablet (7.5 mg total) by mouth daily. 30 tablet 2   rosuvastatin (CRESTOR) 5 MG tablet Take 1 tablet (5 mg total) by mouth daily. 30 tablet 2   No current facility-administered medications for this visit.    Patient confirms/reports the following allergies:  No Known Allergies  No orders of the defined types were placed in this encounter.   AUTHORIZATION INFORMATION Primary Insurance: Lucile Shutters #: [id number],  Group #: [id number] Pre-Cert / Berkley Harvey required: No, not required  SCHEDULE INFORMATION: Procedure has  been scheduled as follows:  Date: 08/11/2021, Time: 9:00  Location: [facility] with Dr. Marletta Lor  This Gastroenterology Pre-Precedure Review Form is being routed to the following provider(s): Tana Coast, PA-C

## 2021-07-22 NOTE — Progress Notes (Signed)
Garrett Lin [address] [place] [address]   CLINIC:  Medical Oncology/Hematology  CONSULT NOTE  Patient Care Team: Loman Brooklyn, FNP as PCP - General (Family Medicine) Eloise Harman, DO as Consulting Physician (Gastroenterology)  CHIEF COMPLAINTS/PURPOSE OF CONSULTATION:  Iron overload  HISTORY OF PRESENTING ILLNESS:  Garrett Lin 60 y.o. male is here because at the request of his primary care provider (NP Hendricks Limes) due to iron overload.  Iron overload was incidental finding on recent MRI abdomen, which was obtained due to history of liver lesion (benign hepatic cyst).  MRI abdomen (06/11/2021) showed severe diffuse hepatic iron deposition.  Review of lab records does not show the patient has had any recent iron panel or ferritin.  Patient has symptoms of iron overload with fatigue, joint pain (specially shoulders and neck), abdominal pain after eating, and poor appetite.  He denies any memory issues or "brain fog."  He has not had any skin issues such as photosensitivity, abnormal rash, or changes in skin color (bronze or gray skin).  Eats red meat 4-5 times per week, as well as occasional liver.  He cooks in a cast iron skillet on a regular basis.  He drinks occasional alcoholic beverages once or twice a year, but does have a history of heavy alcohol use about 26 years ago.  Patient reports that he used to give blood regularly, but stopped donating blood in 1996 when he was told that he had "something in his blood."  No previous history of liver disease.  No known history of viral hepatitis. No history of heart disease. No previous diagnosis of iron overload or hemochromatosis. No family history of hemochromatosis.  He does not regularly see a gastroenterologist, but was recently referred to GI (Dr. Abbey Chatters / Neil Crouch PA-C) for colonoscopy.  He has minimal past medical history, notable only for irritable bowel syndrome and history of  small bowel obstruction.  He currently works as a Dealer for [employer].  He smokes 1.0-1.5 PPD cigarettes since age 60.  He drinks socially, usually 1 or 2 mixed drinks once or twice a year.  He has a previous history of heavy alcohol use, but quit 26 years ago (age 43).  He denies any illicit drug use.  No family history of liver disease, hemochromatosis, blood disorders, or iron disorders.  Patient's mother had breast cancer.   MEDICAL HISTORY:  Past Medical History:  Diagnosis Date   Chronic pain    right forearm.   Irritable bowel syndrome (IBS) 2006, 2007   "nervous stomach" abdominal pain   Small bowel obstruction due to adhesions ([place]) 09/02/2013    SURGICAL HISTORY: Past Surgical History:  Procedure Laterality Date   COLONOSCOPY  2006   Laurence Spates MD   ESOPHAGOGASTRODUODENOSCOPY  2006   Darcus Pester MD   LAPAROSCOPIC LYSIS OF ADHESIONS N/A 09/02/2013   Procedure: LAPAROSCOPIC LYSIS OF ADHESIONS ;  Surgeon: Adin Hector, MD;  Location: [place];  Service: General;  Laterality: N/A;   SHOULDER ARTHROSCOPY Right 04/2014   Dr. Veverly Fells   surgery for right forarm trauma     work related machine injury    SOCIAL HISTORY: Social History   Socioeconomic History   Marital status: Married    Spouse name: Not on file   Number of children: Not on file   Years of education: Not on file   Highest education level: Not on file  Occupational History   Not on  file  Tobacco Use   Smoking status: Every Day    Packs/day: 1.00    Years: 38.00    Pack years: 38.00    Types: Cigarettes   Smokeless tobacco: Never  Vaping Use   Vaping Use: Never used  Substance and Sexual Activity   Alcohol use: No   Drug use: No   Sexual activity: Not on file  Other Topics Concern   Not on file  Social History Narrative   Not on file   Social Determinants of Health   Financial Resource Strain: Not on file  Food Insecurity: Not on file  Transportation Needs: Not on file   Physical Activity: Not on file  Stress: Not on file  Social Connections: Not on file  Intimate Partner Violence: Not on file    FAMILY HISTORY: Family History  Problem Relation Age of Onset   Ovarian cancer Sister     ALLERGIES:  has No Known Allergies.  MEDICATIONS:  Current Outpatient Medications  Medication Sig Dispense Refill   Aspirin-Acetaminophen-Caffeine (GOODY HEADACHE PO) Take by mouth as needed.     meloxicam (MOBIC) 7.5 MG tablet Take 1 tablet (7.5 mg total) by mouth daily. 30 tablet 2   rosuvastatin (CRESTOR) 5 MG tablet Take 1 tablet (5 mg total) by mouth daily. 30 tablet 2   No current facility-administered medications for this visit.    REVIEW OF SYSTEMS:   Review of Systems  Constitutional:  Positive for appetite change and fatigue. Negative for chills, diaphoresis, fever and unexpected weight change.  HENT:   Negative for lump/mass and nosebleeds.   Eyes:  Negative for eye problems.  Respiratory:  Negative for cough, hemoptysis and shortness of breath.   Cardiovascular:  Negative for chest pain, leg swelling and palpitations.  Gastrointestinal:  Positive for abdominal pain (after eating). Negative for blood in stool, constipation, diarrhea, nausea and vomiting.  Genitourinary:  Negative for hematuria.   Musculoskeletal:  Positive for arthralgias.  Skin: Negative.   Neurological:  Negative for dizziness, headaches and light-headedness.  Hematological:  Does not bruise/bleed easily.     PHYSICAL EXAMINATION: ECOG PERFORMANCE STATUS: 1 - Symptomatic but completely ambulatory  There were no vitals filed for this visit. There were no vitals filed for this visit.  Physical Exam Constitutional:      Appearance: Normal appearance.     Comments: Thin body habitus  HENT:     Head: Normocephalic and atraumatic.     Mouth/Throat:     Mouth: Mucous membranes are moist.  Eyes:     Extraocular Movements: Extraocular movements intact.     Pupils: Pupils are  equal, round, and reactive to light.  Cardiovascular:     Rate and Rhythm: Normal rate and regular rhythm.     Pulses: Normal pulses.     Heart sounds: Normal heart sounds.  Pulmonary:     Effort: Pulmonary effort is normal.     Breath sounds: Normal breath sounds.  Abdominal:     General: Bowel sounds are normal.     Palpations: Abdomen is soft.     Tenderness: There is no abdominal tenderness.  Musculoskeletal:        General: No swelling.     Right lower leg: No edema.     Left lower leg: No edema.  Lymphadenopathy:     Cervical: No cervical adenopathy.  Skin:    General: Skin is warm and dry.  Neurological:     General: No focal deficit present.  Mental Status: He is alert and oriented to person, place, and time.  Psychiatric:        Mood and Affect: Mood normal.        Behavior: Behavior normal.      LABORATORY DATA:  I have reviewed the data as listed Recent Results (from the past 2160 hour(s))  CBC with Differential/Platelet     Status: None   Collection Time: 05/13/21  9:54 AM  Result Value Ref Range   WBC 7.7 3.4 - 10.8 x10E3/uL   RBC 5.45 4.14 - 5.80 x10E6/uL   Hemoglobin 17.3 13.0 - 17.7 g/dL   Hematocrit 50.8 37.5 - 51.0 %   MCV 93 79 - 97 fL   MCH 31.7 26.6 - 33.0 pg   MCHC 34.1 31.5 - 35.7 g/dL   RDW 12.1 11.6 - 15.4 %   Platelets 325 150 - 450 x10E3/uL   Neutrophils 63 Not Estab. %   Lymphs 26 Not Estab. %   Monocytes 8 Not Estab. %   Eos 2 Not Estab. %   Basos 1 Not Estab. %   Neutrophils Absolute 4.9 1.4 - 7.0 x10E3/uL   Lymphocytes Absolute 2.0 0.7 - 3.1 x10E3/uL   Monocytes Absolute 0.6 0.1 - 0.9 x10E3/uL   EOS (ABSOLUTE) 0.2 0.0 - 0.4 x10E3/uL   Basophils Absolute 0.1 0.0 - 0.2 x10E3/uL   Immature Granulocytes 0 Not Estab. %   Immature Grans (Abs) 0.0 0.0 - 0.1 x10E3/uL  CMP14+EGFR     Status: None   Collection Time: 05/13/21  9:54 AM  Result Value Ref Range   Glucose 90 65 - 99 mg/dL   BUN 18 6 - 24 mg/dL   Creatinine, Ser 1.02 0.76  - 1.27 mg/dL   eGFR 86 >59 mL/min/1.73   BUN/Creatinine Ratio 18 9 - 20   Sodium 138 134 - 144 mmol/L   Potassium 5.2 3.5 - 5.2 mmol/L   Chloride 101 96 - 106 mmol/L   CO2 24 20 - 29 mmol/L   Calcium 10.2 8.7 - 10.2 mg/dL   Total Protein 7.0 6.0 - 8.5 g/dL   Albumin 4.8 3.8 - 4.9 g/dL   Globulin, Total 2.2 1.5 - 4.5 g/dL   Albumin/Globulin Ratio 2.2 1.2 - 2.2   Bilirubin Total 0.7 0.0 - 1.2 mg/dL   Alkaline Phosphatase 105 44 - 121 IU/L   AST 28 0 - 40 IU/L   ALT 24 0 - 44 IU/L  TSH     Status: None   Collection Time: 05/13/21  9:54 AM  Result Value Ref Range   TSH 1.790 0.450 - 4.500 uIU/mL  PSA, total and free     Status: None   Collection Time: 05/13/21  9:54 AM  Result Value Ref Range   Prostate Specific Ag, Serum 1.3 0.0 - 4.0 ng/mL    Comment: Roche ECLIA methodology. According to the American Urological Association, Serum PSA should decrease and remain at undetectable levels after radical prostatectomy. The AUA defines biochemical recurrence as an initial PSA value 0.2 ng/mL or greater followed by a subsequent confirmatory PSA value 0.2 ng/mL or greater. Values obtained with different assay methods or kits cannot be used interchangeably. Results cannot be interpreted as absolute evidence of the presence or absence of malignant disease.    PSA, Free 0.52 N/A ng/mL    Comment: Roche ECLIA methodology.   PSA, Free Pct 40.0 %    Comment: The table below lists the probability of prostate cancer for men with non-suspicious  DRE results and total PSA between 4 and 10 ng/mL, by patient age Ricci Barker, [place], [id number]).                   % Free PSA       50-64 yr        65-75 yr                   0.00-10.00%        56%             55%                  10.01-15.00%        24%             35%                  15.01-20.00%        17%             23%                  20.01-25.00%        10%             20%                       >25.00%         5%               9% Please note:  Catalona et al did not make specific               recommendations regarding the use of               percent free PSA for any other population               of men.   Lipid panel     Status: Abnormal   Collection Time: 05/13/21  9:54 AM  Result Value Ref Range   Cholesterol, Total 198 100 - 199 mg/dL   Triglycerides 101 0 - 149 mg/dL   HDL 48 >39 mg/dL   VLDL Cholesterol Cal 18 5 - 40 mg/dL   LDL Chol Calc (NIH) 132 (H) 0 - 99 mg/dL   Chol/HDL Ratio 4.1 0.0 - 5.0 ratio    Comment:                                   T. Chol/HDL Ratio                                             Men  Women                               1/2 Avg.Risk  3.4    3.3                                   Avg.Risk  5.0    4.4  2X Avg.Risk  9.6    7.1                                3X Avg.Risk 23.4   11.0   Magnesium     Status: None   Collection Time: 05/13/21  9:54 AM  Result Value Ref Range   Magnesium 2.3 1.6 - 2.3 mg/dL  HIV antibody (with reflex)     Status: None   Collection Time: 05/13/21  9:58 AM  Result Value Ref Range   HIV Screen 4th Generation wRfx Non Reactive Non Reactive    Comment: HIV Negative HIV-1/HIV-2 antibodies and HIV-1 p24 antigen were NOT detected. There is no laboratory evidence of HIV infection.   Hepatitis C antibody (reflex, frozen specimen)     Status: None   Collection Time: 05/13/21  9:58 AM  Result Value Ref Range   HCV Ab <0.1 0.0 - 0.9 s/co ratio  Interpretation:     Status: None   Collection Time: 05/13/21  9:58 AM  Result Value Ref Range   HCV Interp 1: Comment     Comment: Negative Not infected with HCV, unless recent infection is suspected or other evidence exists to indicate HCV infection.     RADIOGRAPHIC STUDIES: I have personally reviewed the radiological images as listed and agreed with the findings in the report. No results found.  ASSESSMENT & PLAN: 1.  Iron overload - Severe hepatic iron deposition was  incidental finding on recent MRI abdomen (06/11/2021), which was obtained by PCP for monitoring of previously noted hepatic cyst. - MRI abdomen (06/11/2021) showed severe diffuse hepatic iron deposition, as well as tiny < 5 mm cyst which correlates with lesion seen on prior CT; no evidence of hepatic neoplasm. - Symptomatic iron overload with fatigue, joint pain, and abdominal pain after eating - No previous history of liver disease, heart disease, iron overload, or hemochromatosis.   - No family history of hemochromatosis.   - He eats red meat 4-5 times per week and cooks in cast iron skillet.  He does not take any iron supplement at home. - No known history of viral hepatitis - Previous history of heavy alcohol use (quit 26 years ago), but drinks occasional social alcohol once or twice a year - He does not regularly see a gastroenterologist, but was recently referred to GI (Dr. Abbey Chatters / Neil Crouch PA-C) for colonoscopy. - We briefly discussed possible diagnosis of hemochromatosis including natural course of the disease with increased risk of cirrhosis, hepatocellular carcinoma, and heart failure.  We discussed that treatment of hemochromatosis may involve phlebotomy or iron chelation agents.  We will discuss further at next appointment after completion of diagnostic work-up. - PLAN: Labs today including iron/TIBC, ferritin, liver function panel, hemochromatosis genetic panel, and hepatitis panel. - Discussed lifestyle modifications including decreased red meat intake, no longer cooking in cast iron skillets, and avoiding alcohol - If hemochromatosis is concerned, would consider checking cardiac MRI due to risk of cardiac iron deposition - If any signs of liver damage apart from iron overload, would consider referral to GI  2.  Tobacco use - Patient smokes 1.0-1.5 PPD since age 29 - Smoking cessation was not addressed at this visit - PLAN: We will address smoking cessation and LDCT screening at  follow-up visits, so as not to overload patient with information today.  3.  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  Prior to that, he worked at a [employer] for 20+ years. - SUBSTANCE: He smokes 1.0-1.5 PPD cigarettes since age 29.  He drinks socially, usually 1 or 2 mixed drinks once or twice a year.  He has a previous history of heavy alcohol use, but quit 26 years ago (age 40).  He denies any illicit drug use. - FAMILY: No family history of liver disease, hemochromatosis, blood disorders, or iron disorders.  Patient's mother had breast cancer.   PLAN SUMMARY & DISPOSITION: -Labs today - RTC in 2 weeks to discuss results  All questions were answered. The patient knows to call the clinic with any problems, questions or concerns.   Medical decision making: Moderate  Time spent on visit: I spent 25 minutes counseling the patient face to face. The total time spent in the appointment was 40 minutes and more than 50% was on counseling.  I, Tarri Abernethy PA-C, have seen this patient in conjunction with Dr. Derek Jack.  Greater than 50% of visit was performed by Dr. Delton Coombes.     Harriett Rush, PA-C 07/23/2021 9:54 AM  DR. Dawnna Gritz: I have independently performed a face-to-face evaluation with this patient and agree with HPI written by Casey Burkitt, PA-C.  He was evaluated because of severe diffuse hepatic iron deposition on MRI of the abdomen dated 06/11/2021.  We discussed possible diagnosis of hereditary hemochromatosis.  We also discussed possibility of secondary iron overload.  We will check ferritin and percent saturation.  We will also check hemochromatosis panel today.  We will reevaluate him in 2 weeks to discuss diagnosis and treatment plan.

## 2021-07-23 ENCOUNTER — Other Ambulatory Visit: Payer: Self-pay

## 2021-07-23 ENCOUNTER — Inpatient Hospital Stay (HOSPITAL_COMMUNITY): Payer: Managed Care, Other (non HMO) | Attending: Hematology | Admitting: Hematology

## 2021-07-23 ENCOUNTER — Inpatient Hospital Stay (HOSPITAL_COMMUNITY): Payer: Managed Care, Other (non HMO)

## 2021-07-23 DIAGNOSIS — Z8041 Family history of malignant neoplasm of ovary: Secondary | ICD-10-CM | POA: Diagnosis not present

## 2021-07-23 DIAGNOSIS — F1021 Alcohol dependence, in remission: Secondary | ICD-10-CM | POA: Insufficient documentation

## 2021-07-23 DIAGNOSIS — F17210 Nicotine dependence, cigarettes, uncomplicated: Secondary | ICD-10-CM | POA: Insufficient documentation

## 2021-07-23 LAB — HEPATITIS PANEL, ACUTE
HCV Ab: NONREACTIVE
Hep A IgM: NONREACTIVE
Hep B C IgM: NONREACTIVE
Hepatitis B Surface Ag: NONREACTIVE

## 2021-07-23 LAB — HEPATIC FUNCTION PANEL
ALT: 32 U/L (ref 0–44)
AST: 26 U/L (ref 15–41)
Albumin: 4.4 g/dL (ref 3.5–5.0)
Alkaline Phosphatase: 75 U/L (ref 38–126)
Bilirubin, Direct: 0.1 mg/dL (ref 0.0–0.2)
Indirect Bilirubin: 0.8 mg/dL (ref 0.3–0.9)
Total Bilirubin: 0.9 mg/dL (ref 0.3–1.2)
Total Protein: 7.3 g/dL (ref 6.5–8.1)

## 2021-07-23 LAB — CBC WITH DIFFERENTIAL/PLATELET
Abs Immature Granulocytes: 0.02 10*3/uL (ref 0.00–0.07)
Basophils Absolute: 0.1 10*3/uL (ref 0.0–0.1)
Basophils Relative: 1 %
Eosinophils Absolute: 0.1 10*3/uL (ref 0.0–0.5)
Eosinophils Relative: 1 %
HCT: 51.1 % (ref 39.0–52.0)
Hemoglobin: 17.2 g/dL — ABNORMAL HIGH (ref 13.0–17.0)
Immature Granulocytes: 0 %
Lymphocytes Relative: 23 %
Lymphs Abs: 2 10*3/uL (ref 0.7–4.0)
MCH: 31.6 pg (ref 26.0–34.0)
MCHC: 33.7 g/dL (ref 30.0–36.0)
MCV: 93.9 fL (ref 80.0–100.0)
Monocytes Absolute: 0.6 10*3/uL (ref 0.1–1.0)
Monocytes Relative: 7 %
Neutro Abs: 5.8 10*3/uL (ref 1.7–7.7)
Neutrophils Relative %: 68 %
Platelets: 273 10*3/uL (ref 150–400)
RBC: 5.44 MIL/uL (ref 4.22–5.81)
RDW: 11.3 % — ABNORMAL LOW (ref 11.5–15.5)
WBC: 8.6 10*3/uL (ref 4.0–10.5)
nRBC: 0 % (ref 0.0–0.2)

## 2021-07-23 LAB — IRON AND TIBC
Iron: 230 ug/dL — ABNORMAL HIGH (ref 45–182)
Saturation Ratios: 86 % — ABNORMAL HIGH (ref 17.9–39.5)
TIBC: 267 ug/dL (ref 250–450)
UIBC: 37 ug/dL

## 2021-07-23 LAB — FERRITIN: Ferritin: 1042 ng/mL — ABNORMAL HIGH (ref 24–336)

## 2021-07-23 NOTE — Patient Instructions (Addendum)
[facility] at [facility] Discharge Instructions  You were seen today by Dr. Ellin Saba and Rojelio Brenner PA-C for your iron overload.  As we discussed, you may have a condition known as "hereditary hemochromatosis" which means that your body absorbs and  stores too much iron.  This can have serious consequences if untreated, such as liver cirrhosis, heart failure, as well as symptoms of general fatigue and joint pain.    You have not been officially diagnosed with this condition yet, so we will check tests today to determine if this is the cause of your iron overload.  We will discuss this further at your follow-up visit, but for now please read the attached handout on hemochromatosis.  For the time being, it is also important that you make the following lifestyle changes: - Limit red meat to no more than once per month - Cut all alcohol out of your diet - Stop cooking in cast iron skillets - Do not take any iron supplements or multivitamins that contain iron   LABS: Check labs today before leaving the hospital  MEDICATIONS: No changes to home medications  FOLLOW-UP APPOINTMENT: Office visit in 2 weeks   Thank you for choosing [facility] at [facility] to provide your oncology and hematology care.  To afford each patient quality time with our provider, please arrive at least 15 minutes before your scheduled appointment time.   If you have a lab appointment with the Cancer Center please come in thru the Main Entrance and check in at the main information desk.  You need to re-schedule your appointment should you arrive 10 or more minutes late.  We strive to give you quality time with our providers, and arriving late affects you and other patients whose appointments are after yours.  Also, if you no show three or more times for appointments you may be dismissed from the clinic at the providers discretion.     Again, thank you for choosing  [facility].  Our hope is that these requests will decrease the amount of time that you wait before being seen by our physicians.       _____________________________________________________________  Should you have questions after your visit to [facility], please contact our office at [phone] and follow the prompts.  Our office hours are 8:00 a.m. and 4:30 p.m. Monday - Friday.  Please note that voicemails left after 4:00 p.m. may not be returned until the following business day.  We are closed weekends and major holidays.  You do have access to a nurse 24-7, just call the main number to the clinic [phone] and do not press any options, hold on the line and a nurse will answer the phone.    For prescription refill requests, have your pharmacy contact our office and allow 72 hours.    Due to Covid, you will need to wear a mask upon entering the hospital. If you do not have a mask, a mask will be given to you at the Main Entrance upon arrival. For doctor visits, patients may have 1 support person age 63 or older with them. For treatment visits, patients can not have anyone with them due to social distancing guidelines and our immunocompromised population.

## 2021-07-29 LAB — HEMOCHROMATOSIS DNA-PCR(C282Y,H63D)

## 2021-08-06 NOTE — Progress Notes (Signed)
[facility] [place], [place] [address]   CLINIC:  Medical Oncology/Hematology  PCP:  Loman [place], [place] [address] [phone]   REASON FOR VISIT:  Follow-up for iron overload due to hereditary hemochromatosis, new diagnosis  PRIOR THERAPY: None  INTERVAL HISTORY:  Garrett Lin 60 y.o. male returns for routine follow-up of iron overload.  He was seen for initial consultation by Dr. Delton Coombes & Tarri Abernethy PA-C on 07/23/2021.  He returns today to discuss the results of that work-up.  At today's visit, he reports feeling about the same compared to his last visit. He has not had any changes in his health status or symptoms since his last visit. He continues to report symptoms of iron overload with fatigue, joint pain (shoulders and neck), abdominal pain after eating, and poor appetite. He continues to deny any memory issues or "brain fog."  He has not had any skin issues such as photosensitivity, abnormal rash, or bronzing or graying of his skin.  Since his last visit, he has cut back on his red meat intake.   He has stopped cooking in cast iron skillet.   He has stopped drinking alcohol.    He has 50% energy and 50% appetite. He endorses that he is maintaining a stable weight.   REVIEW OF SYSTEMS:  Review of Systems  Constitutional:  Positive for appetite change and fatigue. Negative for chills, diaphoresis, fever and unexpected weight change.  HENT:   Negative for lump/mass and nosebleeds.   Eyes:  Negative for eye problems.  Respiratory:  Negative for cough, hemoptysis and shortness of breath.   Cardiovascular:  Negative for chest pain, leg swelling and palpitations.  Gastrointestinal:  Negative for abdominal pain, blood in stool, constipation, diarrhea, nausea and vomiting.  Genitourinary:  Negative for hematuria.   Musculoskeletal:  Positive for arthralgias.  Skin: Negative.   Neurological:  Positive for  headaches. Negative for dizziness and light-headedness.  Hematological:  Does not bruise/bleed easily.     PAST MEDICAL/SURGICAL HISTORY:  Past Medical History:  Diagnosis Date   Chronic pain    right forearm.   Irritable bowel syndrome (IBS) 2006, 2007   "nervous stomach" abdominal pain   Small bowel obstruction due to adhesions ([facility]) 09/02/2013   Past Surgical History:  Procedure Laterality Date   COLONOSCOPY  2006   Laurence Spates MD   ESOPHAGOGASTRODUODENOSCOPY  2006   Darcus Pester MD   LAPAROSCOPIC LYSIS OF ADHESIONS N/A 09/02/2013   Procedure: LAPAROSCOPIC LYSIS OF ADHESIONS ;  Surgeon: Adin Hector, MD;  Location: [facility];  Service: General;  Laterality: N/A;   SHOULDER ARTHROSCOPY Right 04/2014   Dr. Veverly Fells   surgery for right forarm trauma     work related machine injury     SOCIAL HISTORY:  Social History   Socioeconomic History   Marital status: Married    Spouse name: Not on file   Number of children: Not on file   Years of education: Not on file   Highest education level: Not on file  Occupational History   Not on file  Tobacco Use   Smoking status: Every Day    Packs/day: 1.00    Years: 38.00    Pack years: 38.00    Types: Cigarettes   Smokeless tobacco: Never  Vaping Use   Vaping Use: Never used  Substance and Sexual Activity   Alcohol use: No   Drug use: No   Sexual  activity: Not on file  Other Topics Concern   Not on file  Social History Narrative   Not on file   Social Determinants of Health   Financial Resource Strain: Not on file  Food Insecurity: Not on file  Transportation Needs: Not on file  Physical Activity: Not on file  Stress: Not on file  Social Connections: Not on file  Intimate Partner Violence: Not on file    FAMILY HISTORY:  Family History  Problem Relation Age of Onset   Ovarian cancer Sister     CURRENT MEDICATIONS:  Outpatient Encounter Medications as of 08/07/2021  Medication Sig    Aspirin-Acetaminophen-Caffeine (GOODY HEADACHE PO) Take 1 packet by mouth daily as needed (pain).   meloxicam (MOBIC) 7.5 MG tablet Take 1 tablet (7.5 mg total) by mouth daily.   rosuvastatin (CRESTOR) 5 MG tablet Take 1 tablet (5 mg total) by mouth daily.   No facility-administered encounter medications on file as of 08/07/2021.    ALLERGIES:  No Known Allergies   PHYSICAL EXAM:  ECOG PERFORMANCE STATUS: 1 - Symptomatic but completely ambulatory  There were no vitals filed for this visit. There were no vitals filed for this visit. Physical Exam Constitutional:      Appearance: Normal appearance.  HENT:     Head: Normocephalic and atraumatic.     Mouth/Throat:     Mouth: Mucous membranes are moist.  Eyes:     Extraocular Movements: Extraocular movements intact.     Pupils: Pupils are equal, round, and reactive to light.  Cardiovascular:     Rate and Rhythm: Normal rate and regular rhythm.     Pulses: Normal pulses.     Heart sounds: Normal heart sounds.  Pulmonary:     Effort: Pulmonary effort is normal.     Breath sounds: Normal breath sounds.  Abdominal:     General: Bowel sounds are normal.     Palpations: Abdomen is soft.     Tenderness: There is no abdominal tenderness.  Musculoskeletal:        General: No swelling.     Right lower leg: No edema.     Left lower leg: No edema.  Lymphadenopathy:     Cervical: No cervical adenopathy.  Skin:    General: Skin is warm and dry.     Comments: Bronzed color to skin  Neurological:     General: No focal deficit present.     Mental Status: He is alert and oriented to person, place, and time.  Psychiatric:        Mood and Affect: Mood normal.        Behavior: Behavior normal.     LABORATORY DATA:  I have reviewed the labs as listed.  CBC    Component Value Date/Time   WBC 8.6 07/23/2021 1011   RBC 5.44 07/23/2021 1011   HGB 17.2 (H) 07/23/2021 1011   HGB 17.3 05/13/2021 0954   HCT 51.1 07/23/2021 1011   HCT  50.8 05/13/2021 0954   PLT 273 07/23/2021 1011   PLT 325 05/13/2021 0954   MCV 93.9 07/23/2021 1011   MCV 93 05/13/2021 0954   MCH 31.6 07/23/2021 1011   MCHC 33.7 07/23/2021 1011   RDW 11.3 (L) 07/23/2021 1011   RDW 12.1 05/13/2021 0954   LYMPHSABS 2.0 07/23/2021 1011   LYMPHSABS 2.0 05/13/2021 0954   MONOABS 0.6 07/23/2021 1011   EOSABS 0.1 07/23/2021 1011   EOSABS 0.2 05/13/2021 0954   BASOSABS 0.1 07/23/2021 1011  BASOSABS 0.1 05/13/2021 0954   CMP Latest Ref Rng & Units 07/23/2021 05/13/2021 02/04/2017  Glucose 65 - 99 mg/dL - 90 95  BUN 6 - 24 mg/dL - 18 10  Creatinine 9.38 - 1.27 mg/dL - 1.82 9.93  Sodium 716 - 144 mmol/L - 138 141  Potassium 3.5 - 5.2 mmol/L - 5.2 4.5  Chloride 96 - 106 mmol/L - 101 106  CO2 20 - 29 mmol/L - 24 23  Calcium 8.7 - 10.2 mg/dL - 96.7 9.4  Total Protein 6.5 - 8.1 g/dL 7.3 7.0 6.4  Total Bilirubin 0.3 - 1.2 mg/dL 0.9 0.7 0.3  Alkaline Phos 38 - 126 U/L 75 105 80  AST 15 - 41 U/L 26 28 28   ALT 0 - 44 U/L 32 24 37    DIAGNOSTIC IMAGING:  I have independently reviewed the relevant imaging and discussed with the patient.  ASSESSMENT & PLAN: 1.  Hereditary hemochromatosis (C282Y homozygous) - Severe hepatic iron deposition was incidental finding on recent MRI abdomen (06/11/2021), which was obtained by PCP for monitoring of previously noted hepatic cyst. - MRI abdomen (06/11/2021) showed severe diffuse hepatic iron deposition, as well as tiny < 5 mm cyst which correlates with lesion seen on prior CT; no evidence of hepatic neoplasm. - Hemochromatosis DNA testing shows homozygosity for C282Y HFE gene (07/23/2021).  No abnormalities on hepatic function panel.  Viral hepatitis panel was negative. - Symptomatic iron overload with fatigue, joint pain, and abdominal pain after eating  - Have discussed with patient the natural course of untreated hereditary hemochromatosis with risk of cirrhosis, hepatocellular carcinoma, heart failure, and arrhythmia.   Iron overload also increases risk of certain bacterial infections. - We have discussed lifestyle modifications such as avoiding alcohol and iron supplementation.  Recent guidelines do not find that moderate red meat intake adversely affects patients with hemochromatosis.  Patient should avoid raw fish or undercooked meat due to risk of certain bacterial infections to thrive and iron rich environments. - We have discussed phlebotomy protocol for treatment of hemochromatosis: Weekly phlebotomy as tolerated until goal ferritin is reached. If Hgb drops < 11.0, we will decrease frequency of phlebotomy. If patient experiences severe side effects or hemodynamic instability, we will decrease amount taken for phlebotomy. Goal is ferritin < 50 for normalization of iron stores. Once iron stores have normalized, we will decrease frequency of phlebotomy. - Most recent labs (07/23/2021): Hgb 17.2, ferritin 1042, iron saturation 86% - PLAN: Weekly phlebotomy as above.  Monthly CBC. - Recommend genetic testing of first-degree relatives (parents, siblings, children), as early detection and treatment can help prevent complications. - We will order cardiac MRI to assess risk of heart failure and cardiac arrhythmia. - We will defer GI referral for now, since there were no signs of liver damage apart from iron overload on imaging or labs - Repeat CBC and iron panel with RTC in 3 months.  2.  Tobacco use - Patient smokes 1.0-1.5 PPD since age 43 - Smoking cessation was not addressed at this visit - PLAN: We will address smoking cessation and LDCT screening at follow-up visits, so as not to overload patient with information today.   3.  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 - SUBSTANCE: He smokes 1.0-1.5 PPD cigarettes  since age 29.  He drinks socially, usually 1 or 2 mixed drinks once or twice a year.  He has a previous history of heavy alcohol use, but quit 26 years ago (age 7).  He denies any illicit drug use. - FAMILY: No family history of liver disease, hemochromatosis, blood disorders, or iron disorders.  Patient's mother had breast cancer.   PLAN SUMMARY & DISPOSITION: - Weekly phlebotomy as tolerated - Monthly CBC - Cardiac MRI - CBC and iron panel in 3 months - RTC in 3 months for office visit  All questions were answered. The patient knows to call the clinic with any problems, questions or concerns.  Medical decision making: Moderate  Time spent on visit: I spent 20 minutes counseling the patient face to face. The total time spent in the appointment was 30 minutes and more than 50% was on counseling.   Harriett Rush, PA-C  08/07/2021 3:39 PM

## 2021-08-07 ENCOUNTER — Inpatient Hospital Stay (HOSPITAL_COMMUNITY): Payer: Managed Care, Other (non HMO) | Attending: Hematology | Admitting: Physician Assistant

## 2021-08-07 ENCOUNTER — Encounter (HOSPITAL_COMMUNITY): Payer: Self-pay | Admitting: Physician Assistant

## 2021-08-07 ENCOUNTER — Inpatient Hospital Stay (HOSPITAL_COMMUNITY): Payer: Managed Care, Other (non HMO)

## 2021-08-07 ENCOUNTER — Other Ambulatory Visit: Payer: Self-pay

## 2021-08-07 HISTORY — DX: Hereditary hemochromatosis: E83.110

## 2021-08-07 NOTE — Patient Instructions (Signed)
 Cancer Center at [facility] Discharge Instructions  You were seen today by Rojelio Brenner PA-C for your hemochromatosis.  Hereditary hemochromatosis means that you have to broken genes, one from your mother and one from your father, which causes your body to absorb and store too much iron.  This can lead to serious problems such as liver failure, heart failure, and some types of infection.  To treat your hemochromatosis, we will take blood from you once per week, but we will continue to monitor your blood counts to make sure that we do not take too much.  It is important that you have your CHILDREN and SIBLINGS talk to their doctors about being tested for hereditary hemochromatosis, since the disease is much easier to treat earlier on before it causes problems.  Please see the attached handouts and the paperwork given to you during your office visit for more detailed information on your diagnosis and plan.  LABS: Return in 1 month for repeat labs and blood count  OTHER TESTS: Referral for cardiac MRI  TREATMENT: Weekly phlebotomy (taking blood)  FOLLOW-UP APPOINTMENT: Office visit in 3 months   Thank you for choosing  Cancer Center at [facility] to provide your oncology and hematology care.  To afford each patient quality time with our provider, please arrive at least 15 minutes before your scheduled appointment time.   If you have a lab appointment with the Cancer Center please come in thru the Main Entrance and check in at the main information desk.  You need to re-schedule your appointment should you arrive 10 or more minutes late.  We strive to give you quality time with our providers, and arriving late affects you and other patients whose appointments are after yours.  Also, if you no show three or more times for appointments you may be dismissed from the clinic at the providers discretion.     Again, thank you for choosing [facility].  Our hope is that these requests will decrease the amount of time that you wait before being seen by our physicians.       _____________________________________________________________  Should you have questions after your visit to [facility], please contact our office at [phone] and follow the prompts.  Our office hours are 8:00 a.m. and 4:30 p.m. Monday - Friday.  Please note that voicemails left after 4:00 p.m. may not be returned until the following business day.  We are closed weekends and major holidays.  You do have access to a nurse 24-7, just call the main number to the clinic [phone] and do not press any options, hold on the line and a nurse will answer the phone.    For prescription refill requests, have your pharmacy contact our office and allow 72 hours.    Due to Covid, you will need to wear a mask upon entering the hospital. If you do not have a mask, a mask will be given to you at the Main Entrance upon arrival. For doctor visits, patients may have 1 support person age 36 or older with them. For treatment visits, patients can not have anyone with them due to social distancing guidelines and our immunocompromised population.

## 2021-08-07 NOTE — Patient Instructions (Signed)
[facility]  Discharge Instructions: Thank you for choosing [facility] to provide your oncology and hematology care.  If you have a lab appointment with the Cancer Center, please come in thru the Main Entrance and check in at the main information desk.  Wear comfortable clothing and clothing appropriate for easy access to any Portacath or PICC line.   We strive to give you quality time with your provider. You may need to reschedule your appointment if you arrive late (15 or more minutes).  Arriving late affects you and other patients whose appointments are after yours.  Also, if you miss three or more appointments without notifying the office, you may be dismissed from the clinic at the provider's discretion.      For prescription refill requests, have your pharmacy contact our office and allow 72 hours for refills to be completed.        To help prevent nausea and vomiting after your treatment, we encourage you to take your nausea medication as directed.  BELOW ARE SYMPTOMS THAT SHOULD BE REPORTED IMMEDIATELY: *FEVER GREATER THAN 100.4 F (38 C) OR HIGHER *CHILLS OR SWEATING *NAUSEA AND VOMITING THAT IS NOT CONTROLLED WITH YOUR NAUSEA MEDICATION *UNUSUAL SHORTNESS OF BREATH *UNUSUAL BRUISING OR BLEEDING *URINARY PROBLEMS (pain or burning when urinating, or frequent urination) *BOWEL PROBLEMS (unusual diarrhea, constipation, pain near the anus) TENDERNESS IN MOUTH AND THROAT WITH OR WITHOUT PRESENCE OF ULCERS (sore throat, sores in mouth, or a toothache) UNUSUAL RASH, SWELLING OR PAIN  UNUSUAL VAGINAL DISCHARGE OR ITCHING   Items with * indicate a potential emergency and should be followed up as soon as possible or go to the Emergency Department if any problems should occur.  Please show the CHEMOTHERAPY ALERT CARD or IMMUNOTHERAPY ALERT CARD at check-in to the Emergency Department and triage nurse.  Should you have questions after your visit or need to cancel  or reschedule your appointment, please contact [facility] [phone]  and follow the prompts.  Office hours are 8:00 a.m. to 4:30 p.m. Monday - Friday. Please note that voicemails left after 4:00 p.m. may not be returned until the following business day.  We are closed weekends and major holidays. You have access to a nurse at all times for urgent questions. Please call the main number to the clinic [phone] and follow the prompts.  For any non-urgent questions, you may also contact your provider using MyChart. We now offer e-Visits for anyone 18 and older to request care online for non-urgent symptoms. For details visit [email or web address].   Also download the MyChart app! Go to the app store, search "MyChart", open the app, select [facility], and log in with your MyChart username and password.  Due to Covid, a mask is required upon entering the hospital/clinic. If you do not have a mask, one will be given to you upon arrival. For doctor visits, patients may have 1 support person aged 18 or older with them. For treatment visits, patients cannot have anyone with them due to current Covid guidelines and our immunocompromised population.  

## 2021-08-07 NOTE — Progress Notes (Signed)
Juluis Pitch presents today for phlebotomy per MD orders. Phlebotomy procedure started at 0953 and ended 1002 500 mls grams removed. Patient observed for 30 minutes after procedure without any incident. Patient tolerated procedure well. IV needle removed intact.

## 2021-08-11 ENCOUNTER — Other Ambulatory Visit: Payer: Self-pay

## 2021-08-11 ENCOUNTER — Ambulatory Visit (HOSPITAL_COMMUNITY)
Admission: RE | Admit: 2021-08-11 | Discharge: 2021-08-11 | Disposition: A | Discharge status: Home / Self Care | Payer: Managed Care, Other (non HMO) | Attending: Internal Medicine | Admitting: Internal Medicine

## 2021-08-11 ENCOUNTER — Ambulatory Visit (HOSPITAL_COMMUNITY): Payer: Managed Care, Other (non HMO) | Admitting: Anesthesiology

## 2021-08-11 ENCOUNTER — Encounter (HOSPITAL_COMMUNITY)
Admission: RE | Disposition: A | Discharge status: Home / Self Care | Payer: Self-pay | Source: Home / Self Care | Attending: Internal Medicine

## 2021-08-11 ENCOUNTER — Encounter (HOSPITAL_COMMUNITY): Payer: Self-pay

## 2021-08-11 DIAGNOSIS — Z1211 Encounter for screening for malignant neoplasm of colon: Secondary | ICD-10-CM | POA: Diagnosis present

## 2021-08-11 DIAGNOSIS — K644 Residual hemorrhoidal skin tags: Secondary | ICD-10-CM | POA: Diagnosis not present

## 2021-08-11 DIAGNOSIS — K648 Other hemorrhoids: Secondary | ICD-10-CM | POA: Diagnosis not present

## 2021-08-11 HISTORY — PX: COLONOSCOPY WITH PROPOFOL: SHX5780

## 2021-08-11 SURGERY — COLONOSCOPY WITH PROPOFOL
Anesthesia: General

## 2021-08-11 MED ORDER — PROPOFOL 10 MG/ML IV BOLUS
INTRAVENOUS | Status: DC | PRN
  Administered 2021-08-11: 80 mg via INTRAVENOUS
  Administered 2021-08-11: 50 mg via INTRAVENOUS

## 2021-08-11 MED ORDER — LACTATED RINGERS IV SOLN: INTRAVENOUS | Status: DC

## 2021-08-11 MED ORDER — PROPOFOL 500 MG/50ML IV EMUL
INTRAVENOUS | Status: DC | PRN
  Administered 2021-08-11: 150 ug/kg/min via INTRAVENOUS

## 2021-08-11 NOTE — Anesthesia Preprocedure Evaluation (Addendum)
Anesthesia Evaluation  Patient identified by MRN, date of birth, ID band Patient awake    Reviewed: Allergy & Precautions, NPO status , Patient's Chart, lab work & pertinent test results  Airway Mallampati: II  TM Distance: >3 FB Neck ROM: Full    Dental  (+) Dental Advisory Given, Chipped, Missing   Pulmonary Current SmokerPatient did not abstain from smoking.,    Pulmonary exam normal breath sounds clear to auscultation       Cardiovascular Exercise Tolerance: Good negative cardio ROS Normal cardiovascular exam Rhythm:Regular Rate:Normal     Neuro/Psych negative neurological ROS  negative psych ROS   GI/Hepatic negative GI ROS, Neg liver ROS,   Endo/Other  negative endocrine ROS  Renal/GU negative Renal ROS  negative genitourinary   Musculoskeletal negative musculoskeletal ROS (+)   Abdominal   Peds negative pediatric ROS (+)  Hematology  (+) Blood dyscrasia (hemochromatosis), ,   Anesthesia Other Findings   Reproductive/Obstetrics negative OB ROS                            Anesthesia Physical Anesthesia Plan  ASA: 2  Anesthesia Plan: General   Post-op Pain Management:    Induction: Intravenous  PONV Risk Score and Plan: TIVA  Airway Management Planned: Nasal Cannula and Natural Airway  Additional Equipment:   Intra-op Plan:   Post-operative Plan:   Informed Consent: I have reviewed the patients History and Physical, chart, labs and discussed the procedure including the risks, benefits and alternatives for the proposed anesthesia with the patient or authorized representative who has indicated his/her understanding and acceptance.     Dental advisory given  Plan Discussed with: CRNA and Surgeon  Anesthesia Plan Comments:         Anesthesia Quick Evaluation

## 2021-08-11 NOTE — H&P (Signed)
Primary Care Physician:  Gwenlyn Fudge, FNP Primary Gastroenterologist:  Dr. Marletta Lor  Pre-Procedure History & Physical: HPI:  Garrett Lin is a 60 y.o. male is here for a colonoscopy for colon cancer screening purposes.  Patient denies any family history of colorectal cancer.  No melena or hematochezia.  No abdominal pain or unintentional weight loss.  No change in bowel habits.  Overall feels well from a GI standpoint.  Past Medical History:  Diagnosis Date   Chronic pain    right forearm.   Hereditary hemochromatosis ([facility]) 08/07/2021   Irritable bowel syndrome (IBS) 2006, 2007   "nervous stomach" abdominal pain   Small bowel obstruction due to adhesions ([facility]) 09/02/2013    Past Surgical History:  Procedure Laterality Date   COLONOSCOPY  2006   Carman Ching MD   ESOPHAGOGASTRODUODENOSCOPY  2006   Raymondo Band MD   LAPAROSCOPIC LYSIS OF ADHESIONS N/A 09/02/2013   Procedure: LAPAROSCOPIC LYSIS OF ADHESIONS ;  Surgeon: [name], MD;  Location: [facility];  Service: General;  Laterality: N/A;   SHOULDER ARTHROSCOPY Right 04/2014   Dr. Ranell Patrick   surgery for right forarm trauma     work related machine injury    Prior to Admission medications   Medication Sig Start Date End Date Taking? Authorizing Provider  Aspirin-Acetaminophen-Caffeine (GOODY HEADACHE PO) Take 1 packet by mouth daily as needed (pain).   Yes [provider]  meloxicam (MOBIC) 7.5 MG tablet Take 1 tablet (7.5 mg total) by mouth daily. 05/20/21  Yes Deliah Boston F, FNP  rosuvastatin (CRESTOR) 5 MG tablet Take 1 tablet (5 mg total) by mouth daily. 05/13/21  Yes Deliah Boston F, FNP    Allergies as of 07/20/2021   (No Known Allergies)    Family History  Problem Relation Age of Onset   Ovarian cancer Sister     Social History   Socioeconomic History   Marital status: Married    Spouse name: Not on file   Number of children: Not on file   Years of education: Not on file   Highest  education level: Not on file  Occupational History   Not on file  Tobacco Use   Smoking status: Every Day    Packs/day: 1.00    Years: 38.00    Pack years: 38.00    Types: Cigarettes   Smokeless tobacco: Never  Vaping Use   Vaping Use: Never used  Substance and Sexual Activity   Alcohol use: No   Drug use: No   Sexual activity: Not on file  Other Topics Concern   Not on file  Social History Narrative   Not on file   Social Determinants of Health   Financial Resource Strain: Not on file  Food Insecurity: Not on file  Transportation Needs: Not on file  Physical Activity: Not on file  Stress: Not on file  Social Connections: Not on file  Intimate Partner Violence: Not on file    Review of Systems: See HPI, otherwise negative ROS  Physical Exam: Vital signs in last 24 hours: Temp:  [97.9 F (36.6 C)] 97.9 F (36.6 C) (11/08 0748) Resp:  [19] 19 (11/08 0748) BP: (140)/(86) 140/86 (11/08 0748) SpO2:  [97 %] 97 % (11/08 0748) Weight:  [65.8 kg] 65.8 kg (11/08 0748)   General:   Alert,  Well-developed, well-nourished, pleasant and cooperative in NAD Head:  Normocephalic and atraumatic. Eyes:  Sclera clear, no icterus.   Conjunctiva pink. Ears:  Normal auditory acuity.  Nose:  No deformity, discharge,  or lesions. Mouth:  No deformity or lesions, dentition normal. Neck:  Supple; no masses or thyromegaly. Lungs:  Clear throughout to auscultation.   No wheezes, crackles, or rhonchi. No acute distress. Heart:  Regular rate and rhythm; no murmurs, clicks, rubs,  or gallops. Abdomen:  Soft, nontender and nondistended. No masses, hepatosplenomegaly or hernias noted. Normal bowel sounds, without guarding, and without rebound.   Msk:  Symmetrical without gross deformities. Normal posture. Extremities:  Without clubbing or edema. Neurologic:  Alert and  oriented x4;  grossly normal neurologically. Skin:  Intact without significant lesions or rashes. Cervical Nodes:  No  significant cervical adenopathy. Psych:  Alert and cooperative. Normal mood and affect.  Impression/Plan: Garrett Lin is here for a colonoscopy to be performed for colon cancer screening purposes.  The risks of the procedure including infection, bleed, or perforation as well as benefits, limitations, alternatives and imponderables have been reviewed with the patient. Questions have been answered. All parties agreeable.

## 2021-08-11 NOTE — Anesthesia Postprocedure Evaluation (Signed)
Anesthesia Post Note  Patient: Garrett Lin  Procedure(s) Performed: COLONOSCOPY WITH PROPOFOL  Patient location during evaluation: Endoscopy Anesthesia Type: General Level of consciousness: awake and alert and oriented Pain management: pain level controlled Vital Signs Assessment: post-procedure vital signs reviewed and stable Respiratory status: spontaneous breathing, nonlabored ventilation and respiratory function stable Cardiovascular status: blood pressure returned to baseline and stable Postop Assessment: no apparent nausea or vomiting Anesthetic complications: no   No notable events documented.   Last Vitals:  Vitals:   08/11/21 0748 08/11/21 0929  BP: 140/86 (!) 101/57  Resp: 19 19  Temp: 36.6 C 36.6 C  SpO2: 97% 97%    Last Pain:  Vitals:   08/11/21 0929  TempSrc: Oral  PainSc: 0-No pain                 Kirsty Monjaraz C Kalifa Cadden

## 2021-08-11 NOTE — Transfer of Care (Signed)
Immediate Anesthesia Transfer of Care Note  Patient: Garrett Lin  Procedure(s) Performed: COLONOSCOPY WITH PROPOFOL  Patient Location: PACU  Anesthesia Type:General  Level of Consciousness: awake  Airway & Oxygen Therapy: Patient Spontanous Breathing  Post-op Assessment: Report given to RN and Post -op Vital signs reviewed and stable  Post vital signs: Reviewed and stable  Last Vitals:  Vitals Value Taken Time  BP    Temp 36.6 C 08/11/21 0929  Pulse    Resp    SpO2      Last Pain:  Vitals:   08/11/21 0929  TempSrc: Oral  PainSc:       Patients Stated Pain Goal: 2 (08/11/21 0748)  Complications: No notable events documented.

## 2021-08-11 NOTE — Discharge Instructions (Addendum)

## 2021-08-11 NOTE — Op Note (Signed)
[facility] Patient Name: Garrett Lin Procedure Date: 08/11/2021 9:00 AM MRN: [id number] Date of Birth: 01-29-1961 Attending MD: Elon Alas. Abbey Chatters DO CSN: [id number] Age: 60 Admit Type: Outpatient Procedure:                Colonoscopy Indications:              Screening for colorectal malignant neoplasm Providers:                Elon Alas. Abbey Chatters, DO, Jessica Boudreaux, Randa Spike, Technician Referring MD:              Medicines:                See the Anesthesia note for documentation of the                            administered medications Complications:            No immediate complications. Estimated Blood Loss:     Estimated blood loss: none. Procedure:                Pre-Anesthesia Assessment:                           - The anesthesia plan was to use monitored                            anesthesia care (MAC).                           After obtaining informed consent, the colonoscope                            was passed under direct vision. Throughout the                            procedure, the patient's blood pressure, pulse, and                            oxygen saturations were monitored continuously. The                            [id number]-[id number] ([id number]) scope was introduced through                            the anus and advanced to the the cecum, identified                            by appendiceal orifice and ileocecal valve. The                            colonoscopy was performed without difficulty. The                            patient tolerated the procedure well. The quality  of the bowel preparation was evaluated using the                            BBPS [facility] Bowel Preparation Scale) with scores                            of: Right Colon = 3, Transverse Colon = 3 and Left                            Colon = 3 (entire mucosa seen well with no residual                            staining, small  fragments of stool or opaque                            liquid). The total BBPS score equals 9. Scope In: 9:15:18 AM Scope Out: 9:25:04 AM Scope Withdrawal Time: 0 hours 8 minutes 5 seconds  Total Procedure Duration: 0 hours 9 minutes 46 seconds  Findings:      Non-bleeding internal hemorrhoids were found during endoscopy.      The exam was otherwise without abnormality.      Hemorrhoids were found on perianal exam. Impression:               - Non-bleeding internal hemorrhoids.                           - The examination was otherwise normal.                           - Hemorrhoids found on perianal exam.                           - No specimens collected. Moderate Sedation:      Per Anesthesia Care Recommendation:           - Patient has a contact number available for                            emergencies. The signs and symptoms of potential                            delayed complications were discussed with the                            patient. Return to normal activities tomorrow.                            Written discharge instructions were provided to the                            patient.                           - Resume previous diet.                           -  Continue present medications.                           - Repeat colonoscopy in 10 years for screening                            purposes.                           - Return to GI clinic PRN. Procedure Code(s):        --- Professional ---                           G9201, Colorectal cancer screening; colonoscopy on                            individual not meeting criteria for high risk Diagnosis Code(s):        --- Professional ---                           Z12.11, Encounter for screening for malignant                            neoplasm of colon                           K64.8, Other hemorrhoids CPT copyright 2019 American Medical Association. All rights reserved. The codes documented in this report are  preliminary and upon coder review may  be revised to meet current compliance requirements. Elon Alas. Abbey Chatters, DO [place] Abbey Chatters, DO 08/11/2021 9:27:15 AM This report has been signed electronically. Number of Addenda: 0

## 2021-08-14 ENCOUNTER — Encounter (HOSPITAL_COMMUNITY): Payer: Self-pay | Admitting: Internal Medicine

## 2021-08-14 ENCOUNTER — Other Ambulatory Visit: Payer: Self-pay

## 2021-08-14 ENCOUNTER — Inpatient Hospital Stay (HOSPITAL_COMMUNITY): Payer: Managed Care, Other (non HMO)

## 2021-08-14 NOTE — Progress Notes (Signed)
Garrett Lin presents for therapeutic phlebotomy per MD orders. Last HGB 17.2 / HCT 51.5 on 07/23/2021 . Vital signs stable prior to procedure. Procedure started at 11:47 am and ended at 11:52 am. 500 mls of blood removed. Patient denies any dizziness , lightheadedness, or feeling faint.  Gauze and coban applied to site. Vital signs stable at completion of procedure. Patient has no complaints at this time. Alert and oriented x 3. Discharged in stable condition.

## 2021-08-14 NOTE — Patient Instructions (Signed)
[facility]  Discharge Instructions: Thank you for choosing [facility] to provide your oncology and hematology care.  If you have a lab appointment with the Cancer Center, please come in thru the Main Entrance and check in at the main information desk.  Wear comfortable clothing and clothing appropriate for easy access to any Portacath or PICC line.   We strive to give you quality time with your provider. You may need to reschedule your appointment if you arrive late (15 or more minutes).  Arriving late affects you and other patients whose appointments are after yours.  Also, if you miss three or more appointments without notifying the office, you may be dismissed from the clinic at the provider's discretion.      For prescription refill requests, have your pharmacy contact our office and allow 72 hours for refills to be completed.    Today you received the following:Phlebotomy.      To help prevent nausea and vomiting after your treatment, we encourage you to take your nausea medication as directed.  BELOW ARE SYMPTOMS THAT SHOULD BE REPORTED IMMEDIATELY: *FEVER GREATER THAN 100.4 F (38 C) OR HIGHER *CHILLS OR SWEATING *NAUSEA AND VOMITING THAT IS NOT CONTROLLED WITH YOUR NAUSEA MEDICATION *UNUSUAL SHORTNESS OF BREATH *UNUSUAL BRUISING OR BLEEDING *URINARY PROBLEMS (pain or burning when urinating, or frequent urination) *BOWEL PROBLEMS (unusual diarrhea, constipation, pain near the anus) TENDERNESS IN MOUTH AND THROAT WITH OR WITHOUT PRESENCE OF ULCERS (sore throat, sores in mouth, or a toothache) UNUSUAL RASH, SWELLING OR PAIN  UNUSUAL VAGINAL DISCHARGE OR ITCHING   Items with * indicate a potential emergency and should be followed up as soon as possible or go to the Emergency Department if any problems should occur.  Please show the CHEMOTHERAPY ALERT CARD or IMMUNOTHERAPY ALERT CARD at check-in to the Emergency Department and triage nurse.  Should you  have questions after your visit or need to cancel or reschedule your appointment, please contact [facility] [phone]  and follow the prompts.  Office hours are 8:00 a.m. to 4:30 p.m. Monday - Friday. Please note that voicemails left after 4:00 p.m. may not be returned until the following business day.  We are closed weekends and major holidays. You have access to a nurse at all times for urgent questions. Please call the main number to the clinic [phone] and follow the prompts.  For any non-urgent questions, you may also contact your provider using MyChart. We now offer e-Visits for anyone 18 and older to request care online for non-urgent symptoms. For details visit [email or web address].   Also download the MyChart app! Go to the app store, search "MyChart", open the app, select [place], and log in with your MyChart username and password.  Due to Covid, a mask is required upon entering the hospital/clinic. If you do not have a mask, one will be given to you upon arrival. For doctor visits, patients may have 1 support person aged 18 or older with them. For treatment visits, patients cannot have anyone with them due to current Covid guidelines and our immunocompromised population.  

## 2021-08-21 ENCOUNTER — Other Ambulatory Visit: Payer: Self-pay

## 2021-08-21 ENCOUNTER — Inpatient Hospital Stay (HOSPITAL_COMMUNITY): Payer: Managed Care, Other (non HMO)

## 2021-08-21 NOTE — Patient Instructions (Signed)
[facility]  Discharge Instructions: Thank you for choosing [facility] to provide your oncology and hematology care.  If you have a lab appointment with the Cancer Center, please come in thru the Main Entrance and check in at the main information desk.  Wear comfortable clothing and clothing appropriate for easy access to any Portacath or PICC line.   We strive to give you quality time with your provider. You may need to reschedule your appointment if you arrive late (15 or more minutes).  Arriving late affects you and other patients whose appointments are after yours.  Also, if you miss three or more appointments without notifying the office, you may be dismissed from the clinic at the provider's discretion.      For prescription refill requests, have your pharmacy contact our office and allow 72 hours for refills to be completed.    Today you received the following chemotherapy and/or immunotherapy agents Therapeutic Phlebotomy.      To help prevent nausea and vomiting after your treatment, we encourage you to take your nausea medication as directed.  BELOW ARE SYMPTOMS THAT SHOULD BE REPORTED IMMEDIATELY: *FEVER GREATER THAN 100.4 F (38 C) OR HIGHER *CHILLS OR SWEATING *NAUSEA AND VOMITING THAT IS NOT CONTROLLED WITH YOUR NAUSEA MEDICATION *UNUSUAL SHORTNESS OF BREATH *UNUSUAL BRUISING OR BLEEDING *URINARY PROBLEMS (pain or burning when urinating, or frequent urination) *BOWEL PROBLEMS (unusual diarrhea, constipation, pain near the anus) TENDERNESS IN MOUTH AND THROAT WITH OR WITHOUT PRESENCE OF ULCERS (sore throat, sores in mouth, or a toothache) UNUSUAL RASH, SWELLING OR PAIN  UNUSUAL VAGINAL DISCHARGE OR ITCHING   Items with * indicate a potential emergency and should be followed up as soon as possible or go to the Emergency Department if any problems should occur.  Please show the CHEMOTHERAPY ALERT CARD or IMMUNOTHERAPY ALERT CARD at check-in to the  Emergency Department and triage nurse.  Should you have questions after your visit or need to cancel or reschedule your appointment, please contact [facility] [phone]  and follow the prompts.  Office hours are 8:00 a.m. to 4:30 p.m. Monday - Friday. Please note that voicemails left after 4:00 p.m. may not be returned until the following business day.  We are closed weekends and major holidays. You have access to a nurse at all times for urgent questions. Please call the main number to the clinic [phone] and follow the prompts.  For any non-urgent questions, you may also contact your provider using MyChart. We now offer e-Visits for anyone 18 and older to request care online for non-urgent symptoms. For details visit [email or web address].   Also download the MyChart app! Go to the app store, search "MyChart", open the app, select [place], and log in with your MyChart username and password.  Due to Covid, a mask is required upon entering the hospital/clinic. If you do not have a mask, one will be given to you upon arrival. For doctor visits, patients may have 1 support person aged 18 or older with them. For treatment visits, patients cannot have anyone with them due to current Covid guidelines and our immunocompromised population.  

## 2021-08-21 NOTE — Progress Notes (Signed)
Patient presents today for phlebotomy per MD orders. Phlebotomy procedure started at 1135 and ended at 1145. 500 cc removed. Patient tolerated procedure well. Procedure tolerated well and without incident. Discharged ambulatory in stable condition.

## 2021-08-26 ENCOUNTER — Encounter (HOSPITAL_COMMUNITY): Payer: Self-pay

## 2021-08-26 ENCOUNTER — Encounter (HOSPITAL_COMMUNITY): Payer: Managed Care, Other (non HMO)

## 2021-08-26 ENCOUNTER — Other Ambulatory Visit (HOSPITAL_COMMUNITY): Payer: Self-pay | Admitting: Physician Assistant

## 2021-08-26 ENCOUNTER — Inpatient Hospital Stay (HOSPITAL_COMMUNITY): Payer: Managed Care, Other (non HMO)

## 2021-08-26 ENCOUNTER — Other Ambulatory Visit: Payer: Self-pay

## 2021-08-26 LAB — CBC
HCT: 36.6 % — ABNORMAL LOW (ref 39.0–52.0)
Hemoglobin: 12.5 g/dL — ABNORMAL LOW (ref 13.0–17.0)
MCH: 32.7 pg (ref 26.0–34.0)
MCHC: 34.2 g/dL (ref 30.0–36.0)
MCV: 95.8 fL (ref 80.0–100.0)
Platelets: 239 10*3/uL (ref 150–400)
RBC: 3.82 MIL/uL — ABNORMAL LOW (ref 4.22–5.81)
RDW: 12.1 % (ref 11.5–15.5)
WBC: 5.8 10*3/uL (ref 4.0–10.5)
nRBC: 0 % (ref 0.0–0.2)

## 2021-08-26 NOTE — Progress Notes (Signed)
Hgb a little low.  Next phlebotomy the patient will only have a total of 250 cc removed per Rojelio Brenner PA.

## 2021-08-26 NOTE — Progress Notes (Signed)
Patient presents today for therapeutic phlebotomy per providers order.  Vital signs WNL.  Patient states that he felt wiped out after last phlebotomy.  Notified Rojelio Brenner PA, advised to hold phlebotomy and send patient to lab for CBC.  Patient will return in December for his next scheduled appointment.

## 2021-08-26 NOTE — Progress Notes (Signed)
Received message from Rojelio Brenner, [place] about patient. - His hemoglobin is a little bit low.  We will let him skip phlebotomy this week, and will just take half a unit off of him for the next phlebotomies, to see if he tolerates it better.  Make sure he is drinking plenty of water.  Patient aware and agreeable with plan.

## 2021-08-26 NOTE — Progress Notes (Signed)
Patient reported for phlebotomy today, reported to nurse that he was feeling increasingly fatigued and "wiped out."  We checked CBC today which showed Hgb 12.5.  We will skip phlebotomy today, and have patient resume weekly phlebotomies next week.  For next few phlebotomies, we will take only 250 mL each week and see if he tolerates this better.

## 2021-09-04 ENCOUNTER — Other Ambulatory Visit (HOSPITAL_COMMUNITY): Payer: Managed Care, Other (non HMO)

## 2021-09-04 ENCOUNTER — Encounter (HOSPITAL_COMMUNITY): Payer: Managed Care, Other (non HMO)

## 2021-09-11 ENCOUNTER — Other Ambulatory Visit (HOSPITAL_COMMUNITY): Payer: Self-pay | Admitting: Physician Assistant

## 2021-09-11 ENCOUNTER — Other Ambulatory Visit: Payer: Self-pay

## 2021-09-11 ENCOUNTER — Inpatient Hospital Stay (HOSPITAL_COMMUNITY): Payer: Managed Care, Other (non HMO) | Attending: Hematology

## 2021-09-11 ENCOUNTER — Other Ambulatory Visit (HOSPITAL_COMMUNITY): Payer: Managed Care, Other (non HMO)

## 2021-09-11 ENCOUNTER — Encounter (HOSPITAL_COMMUNITY): Payer: Self-pay

## 2021-09-11 DIAGNOSIS — F17210 Nicotine dependence, cigarettes, uncomplicated: Secondary | ICD-10-CM | POA: Diagnosis not present

## 2021-09-11 NOTE — Patient Instructions (Signed)
[facility]  Discharge Instructions: Thank you for choosing [facility] to provide your oncology and hematology care.  If you have a lab appointment with the Cancer Center, please come in thru the Main Entrance and check in at the main information desk.  Wear comfortable clothing and clothing appropriate for easy access to any Portacath or PICC line.   We strive to give you quality time with your provider. You may need to reschedule your appointment if you arrive late (15 or more minutes).  Arriving late affects you and other patients whose appointments are after yours.  Also, if you miss three or more appointments without notifying the office, you may be dismissed from the clinic at the provider's discretion.      For prescription refill requests, have your pharmacy contact our office and allow 72 hours for refills to be completed.    Today you received a therapeutic phlebotomy      To help prevent nausea and vomiting after your treatment, we encourage you to take your nausea medication as directed.  BELOW ARE SYMPTOMS THAT SHOULD BE REPORTED IMMEDIATELY: *FEVER GREATER THAN 100.4 F (38 C) OR HIGHER *CHILLS OR SWEATING *NAUSEA AND VOMITING THAT IS NOT CONTROLLED WITH YOUR NAUSEA MEDICATION *UNUSUAL SHORTNESS OF BREATH *UNUSUAL BRUISING OR BLEEDING *URINARY PROBLEMS (pain or burning when urinating, or frequent urination) *BOWEL PROBLEMS (unusual diarrhea, constipation, pain near the anus) TENDERNESS IN MOUTH AND THROAT WITH OR WITHOUT PRESENCE OF ULCERS (sore throat, sores in mouth, or a toothache) UNUSUAL RASH, SWELLING OR PAIN  UNUSUAL VAGINAL DISCHARGE OR ITCHING   Items with * indicate a potential emergency and should be followed up as soon as possible or go to the Emergency Department if any problems should occur.  Please show the CHEMOTHERAPY ALERT CARD or IMMUNOTHERAPY ALERT CARD at check-in to the Emergency Department and triage nurse.  Should you  have questions after your visit or need to cancel or reschedule your appointment, please contact [facility] [phone]  and follow the prompts.  Office hours are 8:00 a.m. to 4:30 p.m. Monday - Friday. Please note that voicemails left after 4:00 p.m. may not be returned until the following business day.  We are closed weekends and major holidays. You have access to a nurse at all times for urgent questions. Please call the main number to the clinic [phone] and follow the prompts.  For any non-urgent questions, you may also contact your provider using MyChart. We now offer e-Visits for anyone 18 and older to request care online for non-urgent symptoms. For details visit [email or web address].   Also download the MyChart app! Go to the app store, search "MyChart", open the app, select [place], and log in with your MyChart username and password.  Due to Covid, a mask is required upon entering the hospital/clinic. If you do not have a mask, one will be given to you upon arrival. For doctor visits, patients may have 1 support person aged 18 or older with them. For treatment visits, patients cannot have anyone with them due to current Covid guidelines and our immunocompromised population.  

## 2021-09-11 NOTE — Progress Notes (Signed)
Patient complaining that too much blood was taken off during his last phlebotomy and that he was sick during the Thanksgiving Holiday due to this.  Garrett Brenner PA notified of complaints and she went in to see the patient.

## 2021-09-11 NOTE — Progress Notes (Signed)
Garrett Lin presents for therapeutic phlebotomy per MD orders. Last HGB 12.5 / HCT 36.6 on 08/26/21 . Vital signs stable prior to procedure. Procedure started at 10:12 am and ended at 10:15 am. 250 mls of blood removed per Romero Belling PA verbal order. Romero Belling PA at the bedside and plan of care discussed with patient. Patient denies any dizziness , lightheadedness, or feeling faint.  Gauze and coban applied to site. Vital signs stable at completion of procedure. Patient has no complaints at this time. Alert and oriented x 3. Discharged in stable condition.

## 2021-09-15 ENCOUNTER — Telehealth (HOSPITAL_COMMUNITY): Payer: Self-pay | Admitting: Emergency Medicine

## 2021-09-15 NOTE — Telephone Encounter (Signed)
Reaching out to patient to offer assistance regarding upcoming cardiac imaging study; pt verbalizes understanding of appt date/time, parking situation and where to check in, and verified current allergies; name and call back number provided for further questions should they arise Rockwell Alexandria RN Navigator Cardiac Imaging [facility] [phone] office [phone] cell  Denies claustro Denies metal implants Arrival time 730a

## 2021-09-16 ENCOUNTER — Other Ambulatory Visit: Payer: Self-pay

## 2021-09-16 ENCOUNTER — Ambulatory Visit (HOSPITAL_COMMUNITY)
Admission: RE | Admit: 2021-09-16 | Discharge: 2021-09-16 | Disposition: A | Discharge status: Home / Self Care | Payer: Managed Care, Other (non HMO) | Source: Ambulatory Visit | Attending: Physician Assistant | Admitting: Physician Assistant

## 2021-09-16 MED ORDER — GADOBUTROL 1 MMOL/ML IV SOLN
7.0000 mL | Freq: Once | INTRAVENOUS | Status: AC | PRN
  Administered 2021-09-16: 10:00:00 7 mL via INTRAVENOUS

## 2021-09-17 NOTE — Progress Notes (Signed)
Patient called.  Patient aware and all questions answered.

## 2021-09-18 ENCOUNTER — Inpatient Hospital Stay (HOSPITAL_COMMUNITY): Payer: Managed Care, Other (non HMO)

## 2021-09-18 ENCOUNTER — Other Ambulatory Visit: Payer: Self-pay

## 2021-09-18 LAB — CBC WITH DIFFERENTIAL/PLATELET
Abs Immature Granulocytes: 0.02 10*3/uL (ref 0.00–0.07)
Basophils Absolute: 0 10*3/uL (ref 0.0–0.1)
Basophils Relative: 1 %
Eosinophils Absolute: 0.2 10*3/uL (ref 0.0–0.5)
Eosinophils Relative: 2 %
HCT: 43.8 % (ref 39.0–52.0)
Hemoglobin: 15.1 g/dL (ref 13.0–17.0)
Immature Granulocytes: 0 %
Lymphocytes Relative: 32 %
Lymphs Abs: 2.2 10*3/uL (ref 0.7–4.0)
MCH: 33.3 pg (ref 26.0–34.0)
MCHC: 34.5 g/dL (ref 30.0–36.0)
MCV: 96.5 fL (ref 80.0–100.0)
Monocytes Absolute: 0.4 10*3/uL (ref 0.1–1.0)
Monocytes Relative: 6 %
Neutro Abs: 4.2 10*3/uL (ref 1.7–7.7)
Neutrophils Relative %: 59 %
Platelets: 298 10*3/uL (ref 150–400)
RBC: 4.54 MIL/uL (ref 4.22–5.81)
RDW: 11.9 % (ref 11.5–15.5)
WBC: 7.1 10*3/uL (ref 4.0–10.5)
nRBC: 0 % (ref 0.0–0.2)

## 2021-09-18 LAB — FERRITIN: Ferritin: 1024 ng/mL — ABNORMAL HIGH (ref 24–336)

## 2021-09-18 LAB — IRON AND TIBC
Iron: 205 ug/dL — ABNORMAL HIGH (ref 45–182)
Saturation Ratios: 85 % — ABNORMAL HIGH (ref 17.9–39.5)
TIBC: 240 ug/dL — ABNORMAL LOW (ref 250–450)
UIBC: 35 ug/dL

## 2021-09-18 NOTE — Progress Notes (Signed)
Patient presents today for phlebotomy per MD orders. Phlebotomy procedure started at 1053 and ended at 1105. 250 cc removed. Patient tolerated procedure well.  Waited an additional 10 minutes after phlebotomy. Procedure tolerated well and without incident. Discharge from clinic ambulatory in stable condition.  Alert and oriented X 3.  Follow up with [facility] as scheduled.

## 2021-09-18 NOTE — Patient Instructions (Signed)
[facility]  Discharge Instructions: Thank you for choosing [facility] to provide your oncology and hematology care.  If you have a lab appointment with the Cancer Center, please come in thru the Main Entrance and check in at the main information desk.  Wear comfortable clothing and clothing appropriate for easy access to any Portacath or PICC line.   We strive to give you quality time with your provider. You may need to reschedule your appointment if you arrive late (15 or more minutes).  Arriving late affects you and other patients whose appointments are after yours.  Also, if you miss three or more appointments without notifying the office, you may be dismissed from the clinic at the provider's discretion.      For prescription refill requests, have your pharmacy contact our office and allow 72 hours for refills to be completed.    Today you received the following chemotherapy and/or immunotherapy agents Therapeutic Phlebotomy.      To help prevent nausea and vomiting after your treatment, we encourage you to take your nausea medication as directed.  BELOW ARE SYMPTOMS THAT SHOULD BE REPORTED IMMEDIATELY: *FEVER GREATER THAN 100.4 F (38 C) OR HIGHER *CHILLS OR SWEATING *NAUSEA AND VOMITING THAT IS NOT CONTROLLED WITH YOUR NAUSEA MEDICATION *UNUSUAL SHORTNESS OF BREATH *UNUSUAL BRUISING OR BLEEDING *URINARY PROBLEMS (pain or burning when urinating, or frequent urination) *BOWEL PROBLEMS (unusual diarrhea, constipation, pain near the anus) TENDERNESS IN MOUTH AND THROAT WITH OR WITHOUT PRESENCE OF ULCERS (sore throat, sores in mouth, or a toothache) UNUSUAL RASH, SWELLING OR PAIN  UNUSUAL VAGINAL DISCHARGE OR ITCHING   Items with * indicate a potential emergency and should be followed up as soon as possible or go to the Emergency Department if any problems should occur.  Please show the CHEMOTHERAPY ALERT CARD or IMMUNOTHERAPY ALERT CARD at check-in to the  Emergency Department and triage nurse.  Should you have questions after your visit or need to cancel or reschedule your appointment, please contact [facility] [phone]  and follow the prompts.  Office hours are 8:00 a.m. to 4:30 p.m. Monday - Friday. Please note that voicemails left after 4:00 p.m. may not be returned until the following business day.  We are closed weekends and major holidays. You have access to a nurse at all times for urgent questions. Please call the main number to the clinic [phone] and follow the prompts.  For any non-urgent questions, you may also contact your provider using MyChart. We now offer e-Visits for anyone 18 and older to request care online for non-urgent symptoms. For details visit [email or web address].   Also download the MyChart app! Go to the app store, search "MyChart", open the app, select [place], and log in with your MyChart username and password.  Due to Covid, a mask is required upon entering the hospital/clinic. If you do not have a mask, one will be given to you upon arrival. For doctor visits, patients may have 1 support person aged 18 or older with them. For treatment visits, patients cannot have anyone with them due to current Covid guidelines and our immunocompromised population.  

## 2021-09-25 ENCOUNTER — Inpatient Hospital Stay (HOSPITAL_COMMUNITY): Payer: Managed Care, Other (non HMO)

## 2021-09-25 NOTE — Patient Instructions (Signed)
[facility]  Discharge Instructions: °Thank you for choosing [facility] to provide your oncology and hematology care.  °If you have a lab appointment with the Cancer Center, please come in thru the Main Entrance and check in at the main information desk. ° °Wear comfortable clothing and clothing appropriate for easy access to any Portacath or PICC line.  ° °We strive to give you quality time with your provider. You may need to reschedule your appointment if you arrive late (15 or more minutes).  Arriving late affects you and other patients whose appointments are after yours.  Also, if you miss three or more appointments without notifying the office, you may be dismissed from the clinic at the provider’s discretion.    °  °For prescription refill requests, have your pharmacy contact our office and allow 72 hours for refills to be completed.   ° °Today you received the following chemotherapy and/or immunotherapy agents Phlebotomy    °  °To help prevent nausea and vomiting after your treatment, we encourage you to take your nausea medication as directed. ° °BELOW ARE SYMPTOMS THAT SHOULD BE REPORTED IMMEDIATELY: °*FEVER GREATER THAN 100.4 F (38 °C) OR HIGHER °*CHILLS OR SWEATING °*NAUSEA AND VOMITING THAT IS NOT CONTROLLED WITH YOUR NAUSEA MEDICATION °*UNUSUAL SHORTNESS OF BREATH °*UNUSUAL BRUISING OR BLEEDING °*URINARY PROBLEMS (pain or burning when urinating, or frequent urination) °*BOWEL PROBLEMS (unusual diarrhea, constipation, pain near the anus) °TENDERNESS IN MOUTH AND THROAT WITH OR WITHOUT PRESENCE OF ULCERS (sore throat, sores in mouth, or a toothache) °UNUSUAL RASH, SWELLING OR PAIN  °UNUSUAL VAGINAL DISCHARGE OR ITCHING  ° °Items with * indicate a potential emergency and should be followed up as soon as possible or go to the Emergency Department if any problems should occur. ° °Please show the CHEMOTHERAPY ALERT CARD or IMMUNOTHERAPY ALERT CARD at check-in to the Emergency  Department and triage nurse. ° °Should you have questions after your visit or need to cancel or reschedule your appointment, please contact [facility] [phone]  and follow the prompts.  Office hours are 8:00 a.m. to 4:30 p.m. Monday - Friday. Please note that voicemails left after 4:00 p.m. may not be returned until the following business day.  We are closed weekends and major holidays. You have access to a nurse at all times for urgent questions. Please call the main number to the clinic [phone] and follow the prompts. ° °For any non-urgent questions, you may also contact your provider using MyChart. We now offer e-Visits for anyone 18 and older to request care online for non-urgent symptoms. For details visit [email or web address]. °  °Also download the MyChart app! Go to the app store, search "MyChart", open the app, select [place], and log in with your MyChart username and password. ° °Due to Covid, a mask is required upon entering the hospital/clinic. If you do not have a mask, one will be given to you upon arrival. For doctor visits, patients may have 1 support person aged 18 or older with them. For treatment visits, patients cannot have anyone with them due to current Covid guidelines and our immunocompromised population.  °

## 2021-09-25 NOTE — Progress Notes (Signed)
Patient presents today for phlebotomy per MD orders. Phlebotomy procedure started at 0955 and ended at 1000. 250 cc removed. Patient tolerated procedure well.  Patient waited an additional ten minutes after procedure before leaving. Procedure tolerated well and without incident. Discharged ambulatory in stable condition.

## 2021-10-02 ENCOUNTER — Other Ambulatory Visit: Payer: Self-pay

## 2021-10-02 ENCOUNTER — Inpatient Hospital Stay (HOSPITAL_COMMUNITY): Payer: Managed Care, Other (non HMO)

## 2021-10-02 NOTE — Progress Notes (Signed)
Patient presents today for therapeutic phlebotomy.  Patient is in satisfactory condition with no complaints voiced.  Vital signs are stable.  Last hemoglobin on 09/18/2021 was 15.1 and hematocrit was 43.8.  250 cc was drawn off.  Procedure started at 1126 and ended at 1132.  Patient tolerated procedure well with no complaints.  Patient remained in the office 15 minutes post phlebotomy.  Vital signs stable at discharge.  Patient left ambulatory without any complaints of dizziness, weakness, or faintness.

## 2021-10-02 NOTE — Patient Instructions (Signed)
[facility]  Discharge Instructions: Thank you for choosing [facility] to provide your oncology and hematology care.  If you have a lab appointment with the Cancer Center, please come in thru the Main Entrance and check in at the main information desk.  Wear comfortable clothing and clothing appropriate for easy access to any Portacath or PICC line.   We strive to give you quality time with your provider. You may need to reschedule your appointment if you arrive late (15 or more minutes).  Arriving late affects you and other patients whose appointments are after yours.  Also, if you miss three or more appointments without notifying the office, you may be dismissed from the clinic at the provider's discretion.      For prescription refill requests, have your pharmacy contact our office and allow 72 hours for refills to be completed.        To help prevent nausea and vomiting after your treatment, we encourage you to take your nausea medication as directed.  BELOW ARE SYMPTOMS THAT SHOULD BE REPORTED IMMEDIATELY: *FEVER GREATER THAN 100.4 F (38 C) OR HIGHER *CHILLS OR SWEATING *NAUSEA AND VOMITING THAT IS NOT CONTROLLED WITH YOUR NAUSEA MEDICATION *UNUSUAL SHORTNESS OF BREATH *UNUSUAL BRUISING OR BLEEDING *URINARY PROBLEMS (pain or burning when urinating, or frequent urination) *BOWEL PROBLEMS (unusual diarrhea, constipation, pain near the anus) TENDERNESS IN MOUTH AND THROAT WITH OR WITHOUT PRESENCE OF ULCERS (sore throat, sores in mouth, or a toothache) UNUSUAL RASH, SWELLING OR PAIN  UNUSUAL VAGINAL DISCHARGE OR ITCHING   Items with * indicate a potential emergency and should be followed up as soon as possible or go to the Emergency Department if any problems should occur.  Please show the CHEMOTHERAPY ALERT CARD or IMMUNOTHERAPY ALERT CARD at check-in to the Emergency Department and triage nurse.  Should you have questions after your visit or need to cancel  or reschedule your appointment, please contact [facility] [phone]  and follow the prompts.  Office hours are 8:00 a.m. to 4:30 p.m. Monday - Friday. Please note that voicemails left after 4:00 p.m. may not be returned until the following business day.  We are closed weekends and major holidays. You have access to a nurse at all times for urgent questions. Please call the main number to the clinic [phone] and follow the prompts.  For any non-urgent questions, you may also contact your provider using MyChart. We now offer e-Visits for anyone 58 and older to request care online for non-urgent symptoms. For details visit [email or web address].   Also download the MyChart app! Go to the app store, search "MyChart", open the app, select [facility], and log in with your MyChart username and password.  Due to Covid, a mask is required upon entering the hospital/clinic. If you do not have a mask, one will be given to you upon arrival. For doctor visits, patients may have 1 support person aged 58 or older with them. For treatment visits, patients cannot have anyone with them due to current Covid guidelines and our immunocompromised population.  

## 2021-10-09 ENCOUNTER — Inpatient Hospital Stay (HOSPITAL_COMMUNITY): Payer: Managed Care, Other (non HMO) | Attending: Hematology

## 2021-10-09 ENCOUNTER — Other Ambulatory Visit: Payer: Self-pay

## 2021-10-09 ENCOUNTER — Inpatient Hospital Stay (HOSPITAL_COMMUNITY): Payer: Managed Care, Other (non HMO)

## 2021-10-09 LAB — CBC
HCT: 43.5 % (ref 39.0–52.0)
Hemoglobin: 15.2 g/dL (ref 13.0–17.0)
MCH: 33.6 pg (ref 26.0–34.0)
MCHC: 34.9 g/dL (ref 30.0–36.0)
MCV: 96.2 fL (ref 80.0–100.0)
Platelets: 288 10*3/uL (ref 150–400)
RBC: 4.52 MIL/uL (ref 4.22–5.81)
RDW: 11.8 % (ref 11.5–15.5)
WBC: 7.5 10*3/uL (ref 4.0–10.5)
nRBC: 0 % (ref 0.0–0.2)

## 2021-10-09 NOTE — Progress Notes (Signed)
Garrett Lin presents today for phlebotomy per MD orders. Phlebotomy procedure started at 11:37am and ended at 11:40 am. 250 grams removed per [facility] orders. Patient declined 30 minute wait time post phlebotomy due to patient had to arrive at work.  Patient tolerated procedure well. IV needle removed intact. Coban and gauze applied to site.   Phlebotomy performed today per MD orders. Tolerated infusion without adverse affects. Vital signs stable. No complaints at this time. Discharged from clinic ambulatory in stable condition. Alert and oriented x 3. F/U with [facility] as scheduled.

## 2021-10-09 NOTE — Patient Instructions (Signed)
[facility]  Discharge Instructions: Thank you for choosing [facility] to provide your oncology and hematology care.  If you have a lab appointment with the Cancer Center, please come in thru the Main Entrance and check in at the main information desk.  Wear comfortable clothing and clothing appropriate for easy access to any Portacath or PICC line.   We strive to give you quality time with your provider. You may need to reschedule your appointment if you arrive late (15 or more minutes).  Arriving late affects you and other patients whose appointments are after yours.  Also, if you miss three or more appointments without notifying the office, you may be dismissed from the clinic at the provider's discretion.      For prescription refill requests, have your pharmacy contact our office and allow 72 hours for refills to be completed.    Today you received the following:Phlebotomy.      To help prevent nausea and vomiting after your treatment, we encourage you to take your nausea medication as directed.  BELOW ARE SYMPTOMS THAT SHOULD BE REPORTED IMMEDIATELY: *FEVER GREATER THAN 100.4 F (38 C) OR HIGHER *CHILLS OR SWEATING *NAUSEA AND VOMITING THAT IS NOT CONTROLLED WITH YOUR NAUSEA MEDICATION *UNUSUAL SHORTNESS OF BREATH *UNUSUAL BRUISING OR BLEEDING *URINARY PROBLEMS (pain or burning when urinating, or frequent urination) *BOWEL PROBLEMS (unusual diarrhea, constipation, pain near the anus) TENDERNESS IN MOUTH AND THROAT WITH OR WITHOUT PRESENCE OF ULCERS (sore throat, sores in mouth, or a toothache) UNUSUAL RASH, SWELLING OR PAIN  UNUSUAL VAGINAL DISCHARGE OR ITCHING   Items with * indicate a potential emergency and should be followed up as soon as possible or go to the Emergency Department if any problems should occur.  Please show the CHEMOTHERAPY ALERT CARD or IMMUNOTHERAPY ALERT CARD at check-in to the Emergency Department and triage nurse.  Should you  have questions after your visit or need to cancel or reschedule your appointment, please contact [facility] [phone]  and follow the prompts.  Office hours are 8:00 a.m. to 4:30 p.m. Monday - Friday. Please note that voicemails left after 4:00 p.m. may not be returned until the following business day.  We are closed weekends and major holidays. You have access to a nurse at all times for urgent questions. Please call the main number to the clinic [phone] and follow the prompts.  For any non-urgent questions, you may also contact your provider using MyChart. We now offer e-Visits for anyone 18 and older to request care online for non-urgent symptoms. For details visit [email or web address].   Also download the MyChart app! Go to the app store, search "MyChart", open the app, select [place], and log in with your MyChart username and password.  Due to Covid, a mask is required upon entering the hospital/clinic. If you do not have a mask, one will be given to you upon arrival. For doctor visits, patients may have 1 support person aged 18 or older with them. For treatment visits, patients cannot have anyone with them due to current Covid guidelines and our immunocompromised population.  

## 2021-10-16 ENCOUNTER — Other Ambulatory Visit: Payer: Self-pay

## 2021-10-16 ENCOUNTER — Inpatient Hospital Stay (HOSPITAL_COMMUNITY): Payer: Managed Care, Other (non HMO)

## 2021-10-16 NOTE — Progress Notes (Signed)
Patient presents today for phlebotomy per MD orders. Phlebotomy procedure started at 1100 and ended at 1110. 180 cc removed.  Unable to get full 250 due to bleeding around the needle. Patient tolerated procedure well. Procedure tolerated well and without incident. Discharged ambulatory in stable condition.

## 2021-10-16 NOTE — Patient Instructions (Signed)
[facility]  Discharge Instructions: Thank you for choosing [facility] to provide your oncology and hematology care.  If you have a lab appointment with the Cancer Center, please come in thru the Main Entrance and check in at the main information desk.  Wear comfortable clothing and clothing appropriate for easy access to any Portacath or PICC line.   We strive to give you quality time with your provider. You may need to reschedule your appointment if you arrive late (15 or more minutes).  Arriving late affects you and other patients whose appointments are after yours.  Also, if you miss three or more appointments without notifying the office, you may be dismissed from the clinic at the provider's discretion.      For prescription refill requests, have your pharmacy contact our office and allow 72 hours for refills to be completed.    Today you received the following chemotherapy and/or immunotherapy agents Therapeutic Phlebotomy.      To help prevent nausea and vomiting after your treatment, we encourage you to take your nausea medication as directed.  BELOW ARE SYMPTOMS THAT SHOULD BE REPORTED IMMEDIATELY: *FEVER GREATER THAN 100.4 F (38 C) OR HIGHER *CHILLS OR SWEATING *NAUSEA AND VOMITING THAT IS NOT CONTROLLED WITH YOUR NAUSEA MEDICATION *UNUSUAL SHORTNESS OF BREATH *UNUSUAL BRUISING OR BLEEDING *URINARY PROBLEMS (pain or burning when urinating, or frequent urination) *BOWEL PROBLEMS (unusual diarrhea, constipation, pain near the anus) TENDERNESS IN MOUTH AND THROAT WITH OR WITHOUT PRESENCE OF ULCERS (sore throat, sores in mouth, or a toothache) UNUSUAL RASH, SWELLING OR PAIN  UNUSUAL VAGINAL DISCHARGE OR ITCHING   Items with * indicate a potential emergency and should be followed up as soon as possible or go to the Emergency Department if any problems should occur.  Please show the CHEMOTHERAPY ALERT CARD or IMMUNOTHERAPY ALERT CARD at check-in to the  Emergency Department and triage nurse.  Should you have questions after your visit or need to cancel or reschedule your appointment, please contact [facility] [phone]  and follow the prompts.  Office hours are 8:00 a.m. to 4:30 p.m. Monday - Friday. Please note that voicemails left after 4:00 p.m. may not be returned until the following business day.  We are closed weekends and major holidays. You have access to a nurse at all times for urgent questions. Please call the main number to the clinic [phone] and follow the prompts.  For any non-urgent questions, you may also contact your provider using MyChart. We now offer e-Visits for anyone 18 and older to request care online for non-urgent symptoms. For details visit [email or web address].   Also download the MyChart app! Go to the app store, search "MyChart", open the app, select , and log in with your MyChart username and password.  Due to Covid, a mask is required upon entering the hospital/clinic. If you do not have a mask, one will be given to you upon arrival. For doctor visits, patients may have 1 support person aged 18 or older with them. For treatment visits, patients cannot have anyone with them due to current Covid guidelines and our immunocompromised population.  

## 2021-10-23 ENCOUNTER — Encounter (HOSPITAL_COMMUNITY): Payer: Self-pay

## 2021-10-23 ENCOUNTER — Inpatient Hospital Stay (HOSPITAL_COMMUNITY): Payer: Managed Care, Other (non HMO)

## 2021-10-23 ENCOUNTER — Other Ambulatory Visit: Payer: Self-pay

## 2021-10-23 NOTE — Progress Notes (Signed)
Garrett Lin presents for therapeutic phlebotomy per MD orders. Last HGB 15.2 / HCT 43.5 on 10/09/21 . Vital signs stable prior to procedure. Procedure started at 11:17 am  and ended at 11:22 am. 250 mls of blood removed. Patient denies any dizziness , lightheadedness, or feeling faint.  Gauze and coban applied to site. Vital signs stable at completion of procedure. Patient has no complaints at this time. Alert and oriented x 3. Discharged in stable condition.

## 2021-10-23 NOTE — Patient Instructions (Signed)
[facility]  Discharge Instructions: ?Thank you for choosing [facility] to provide your oncology and hematology care.  ?If you have a lab appointment with the Cancer Center, please come in thru the Main Entrance and check in at the main information desk. ? ?Wear comfortable clothing and clothing appropriate for easy access to any Portacath or PICC line.  ? ?We strive to give you quality time with your provider. You may need to reschedule your appointment if you arrive late (15 or more minutes).  Arriving late affects you and other patients whose appointments are after yours.  Also, if you miss three or more appointments without notifying the office, you may be dismissed from the clinic at the provider?s discretion.    ?  ?For prescription refill requests, have your pharmacy contact our office and allow 72 hours for refills to be completed.   ? ?Today you received the following : Therapeutic Phlebotomy    ?  ?To help prevent nausea and vomiting after your treatment, we encourage you to take your nausea medication as directed. ? ?BELOW ARE SYMPTOMS THAT SHOULD BE REPORTED IMMEDIATELY: ?*FEVER GREATER THAN 100.4 F (38 ?C) OR HIGHER ?*CHILLS OR SWEATING ?*NAUSEA AND VOMITING THAT IS NOT CONTROLLED WITH YOUR NAUSEA MEDICATION ?*UNUSUAL SHORTNESS OF BREATH ?*UNUSUAL BRUISING OR BLEEDING ?*URINARY PROBLEMS (pain or burning when urinating, or frequent urination) ?*BOWEL PROBLEMS (unusual diarrhea, constipation, pain near the anus) ?TENDERNESS IN MOUTH AND THROAT WITH OR WITHOUT PRESENCE OF ULCERS (sore throat, sores in mouth, or a toothache) ?UNUSUAL RASH, SWELLING OR PAIN  ?UNUSUAL VAGINAL DISCHARGE OR ITCHING  ? ?Items with * indicate a potential emergency and should be followed up as soon as possible or go to the Emergency Department if any problems should occur. ? ?Please show the CHEMOTHERAPY ALERT CARD or IMMUNOTHERAPY ALERT CARD at check-in to the Emergency Department and triage  nurse. ? ?Should you have questions after your visit or need to cancel or reschedule your appointment, please contact [facility] [phone]  and follow the prompts.  Office hours are 8:00 a.m. to 4:30 p.m. Monday - Friday. Please note that voicemails left after 4:00 p.m. may not be returned until the following business day.  We are closed weekends and major holidays. You have access to a nurse at all times for urgent questions. Please call the main number to the clinic [phone] and follow the prompts. ? ?For any non-urgent questions, you may also contact your provider using MyChart. We now offer e-Visits for anyone 18 and older to request care online for non-urgent symptoms. For details visit [email or web address]. ?  ?Also download the MyChart app! Go to the app store, search "MyChart", open the app, select [facility], and log in with your MyChart username and password. ? ?Due to Covid, a mask is required upon entering the hospital/clinic. If you do not have a mask, one will be given to you upon arrival. For doctor visits, patients may have 1 support person aged 18 or older with them. For treatment visits, patients cannot have anyone with them due to current Covid guidelines and our immunocompromised population.  ?

## 2021-10-30 ENCOUNTER — Other Ambulatory Visit: Payer: Self-pay

## 2021-10-30 ENCOUNTER — Inpatient Hospital Stay (HOSPITAL_COMMUNITY): Payer: Managed Care, Other (non HMO)

## 2021-10-30 NOTE — Progress Notes (Signed)
Patient presents today for therapeutic phlebotomy.  Patient is in satisfactory condition with no complaints voiced.  Vital signs are stable.  We will proceed with phlebotomy per MD orders.   Therapeutic phlebotomy started at 1139 and ended at 1142.  250 mL was drawn off of patient.  Patient tolerated phlebotomy well with no complaints voiced.  Patient refused full 30 minute wait time post phlebotomy.  He remained in the clinic for 10 minutes post phlebotomy with no distress noted.  Vital signs remained stable.  Patient left ambulatory in stable condition.

## 2021-10-30 NOTE — Patient Instructions (Signed)
[facility]  Discharge Instructions: Thank you for choosing [facility] to provide your oncology and hematology care.  If you have a lab appointment with the Cancer Center, please come in thru the Main Entrance and check in at the main information desk.  Wear comfortable clothing and clothing appropriate for easy access to any Portacath or PICC line.   We strive to give you quality time with your provider. You may need to reschedule your appointment if you arrive late (15 or more minutes).  Arriving late affects you and other patients whose appointments are after yours.  Also, if you miss three or more appointments without notifying the office, you may be dismissed from the clinic at the provider's discretion.      For prescription refill requests, have your pharmacy contact our office and allow 72 hours for refills to be completed.        To help prevent nausea and vomiting after your treatment, we encourage you to take your nausea medication as directed.  BELOW ARE SYMPTOMS THAT SHOULD BE REPORTED IMMEDIATELY: *FEVER GREATER THAN 100.4 F (38 C) OR HIGHER *CHILLS OR SWEATING *NAUSEA AND VOMITING THAT IS NOT CONTROLLED WITH YOUR NAUSEA MEDICATION *UNUSUAL SHORTNESS OF BREATH *UNUSUAL BRUISING OR BLEEDING *URINARY PROBLEMS (pain or burning when urinating, or frequent urination) *BOWEL PROBLEMS (unusual diarrhea, constipation, pain near the anus) TENDERNESS IN MOUTH AND THROAT WITH OR WITHOUT PRESENCE OF ULCERS (sore throat, sores in mouth, or a toothache) UNUSUAL RASH, SWELLING OR PAIN  UNUSUAL VAGINAL DISCHARGE OR ITCHING   Items with * indicate a potential emergency and should be followed up as soon as possible or go to the Emergency Department if any problems should occur.  Please show the CHEMOTHERAPY ALERT CARD or IMMUNOTHERAPY ALERT CARD at check-in to the Emergency Department and triage nurse.  Should you have questions after your visit or need to cancel  or reschedule your appointment, please contact [facility] [phone]  and follow the prompts.  Office hours are 8:00 a.m. to 4:30 p.m. Monday - Friday. Please note that voicemails left after 4:00 p.m. may not be returned until the following business day.  We are closed weekends and major holidays. You have access to a nurse at all times for urgent questions. Please call the main number to the clinic [phone] and follow the prompts.  For any non-urgent questions, you may also contact your provider using MyChart. We now offer e-Visits for anyone 18 and older to request care online for non-urgent symptoms. For details visit [email or web address].   Also download the MyChart app! Go to the app store, search "MyChart", open the app, select [place], and log in with your MyChart username and password.  Due to Covid, a mask is required upon entering the hospital/clinic. If you do not have a mask, one will be given to you upon arrival. For doctor visits, patients may have 1 support person aged 18 or older with them. For treatment visits, patients cannot have anyone with them due to current Covid guidelines and our immunocompromised population.  

## 2021-11-05 NOTE — Progress Notes (Addendum)
[place], [place] [address]   CLINIC:  Medical Oncology/Hematology  PCP:  Loman [place], [place] [address] [phone]   REASON FOR VISIT:  Follow-up for hereditary hemochromatosis  PRIOR THERAPY: Weekly phlebotomy (500 mL each)  CURRENT THERAPY: Weekly phlebotomy (250 mL each)  INTERVAL HISTORY:  Garrett Lin 61 y.o. male returns for routine follow-up of hereditary hemochromatosis.  He was last seen by Garrett Abernethy PA-C on 08/07/2021.  At his last visit, he was started on weekly phlebotomy, 500 mL each.  He was unable to tolerate this due to significant fatigue after phlebotomy.  Therefore, he was reduced to weekly phlebotomy of 250 mL each, which he is tolerating better.    At today's visit, he reports feeling somewhat better compared to his last visit.  He continues to have joint pain, fatigue, and abdominal pain after eating, but reports that all of the symptoms are slowly improving.  He continues to have a poor appetite, but is forcing himself to eat.    He has 80% energy and 75% appetite. He endorses that he is maintaining a stable weight.   REVIEW OF SYSTEMS:  Review of Systems  Constitutional:  Positive for appetite change and fatigue. Negative for chills, diaphoresis, fever and unexpected weight change.  HENT:   Negative for lump/mass and nosebleeds.   Eyes:  Negative for eye problems.  Respiratory:  Negative for cough, hemoptysis and shortness of breath.   Cardiovascular:  Negative for chest pain, leg swelling and palpitations.  Gastrointestinal:  Negative for abdominal pain, blood in stool, constipation, diarrhea, nausea and vomiting.  Genitourinary:  Negative for hematuria.   Musculoskeletal:  Positive for arthralgias.  Skin: Negative.   Neurological:  Positive for headaches. Negative for dizziness and light-headedness.  Hematological:  Does not bruise/bleed easily.     PAST  MEDICAL/SURGICAL HISTORY:  Past Medical History:  Diagnosis Date   Chronic pain    right forearm.   Hereditary hemochromatosis ([facility]) 08/07/2021   Irritable bowel syndrome (IBS) 2006, 2007   "nervous stomach" abdominal pain   Small bowel obstruction due to adhesions ([facility]) 09/02/2013   Past Surgical History:  Procedure Laterality Date   COLONOSCOPY  2006   Laurence Spates MD   COLONOSCOPY WITH PROPOFOL N/A 08/11/2021   Procedure: COLONOSCOPY WITH PROPOFOL;  Surgeon: Eloise Harman, DO;  [facility];  Service: Endoscopy;  Laterality: N/A;  9:00 / ASA II   ESOPHAGOGASTRODUODENOSCOPY  2006   Darcus Pester MD   LAPAROSCOPIC LYSIS OF ADHESIONS N/A 09/02/2013   Procedure: LAPAROSCOPIC LYSIS OF ADHESIONS ;  Surgeon: Adin Hector, MD;  Location: [facility];  Service: General;  Laterality: N/A;   SHOULDER ARTHROSCOPY Right 04/2014   Dr. Veverly Fells   surgery for right forarm trauma     work related machine injury     SOCIAL HISTORY:  Social History   Socioeconomic History   Marital status: Married    Spouse name: Not on file   Number of children: Not on file   Years of education: Not on file   Highest education level: Not on file  Occupational History   Not on file  Tobacco Use   Smoking status: Every Day    Packs/day: 1.00    Years: 38.00    Pack years: 38.00    Types: Cigarettes   Smokeless tobacco: Never  Vaping Use   Vaping Use: Never used  Substance and Sexual  Activity   Alcohol use: No   Drug use: No   Sexual activity: Not on file  Other Topics Concern   Not on file  Social History Narrative   Not on file   Social Determinants of Health   Financial Resource Strain: Not on file  Food Insecurity: Not on file  Transportation Needs: Not on file  Physical Activity: Not on file  Stress: Not on file  Social Connections: Not on file  Intimate Partner Violence: Not on file    FAMILY HISTORY:  Family History  Problem Relation Age of Onset   Ovarian cancer  Sister     CURRENT MEDICATIONS:  Outpatient Encounter Medications as of 11/06/2021  Medication Sig   Aspirin-Acetaminophen-Caffeine (GOODY HEADACHE PO) Take 1 packet by mouth daily as needed (pain).   CLENPIQ 10-3.5-12 MG-GM -GM/160ML SOLN SMARTSIG:320 Milliliter(s) By Mouth As Directed   meloxicam (MOBIC) 7.5 MG tablet Take 1 tablet (7.5 mg total) by mouth daily.   rosuvastatin (CRESTOR) 5 MG tablet Take 1 tablet (5 mg total) by mouth daily.   No facility-administered encounter medications on file as of 11/06/2021.    ALLERGIES:  No Known Allergies   PHYSICAL EXAM:  ECOG PERFORMANCE STATUS: 1 - Symptomatic but completely ambulatory  There were no vitals filed for this visit. There were no vitals filed for this visit. Physical Exam Constitutional:      Appearance: Normal appearance.  HENT:     Head: Normocephalic and atraumatic.     Mouth/Throat:     Mouth: Mucous membranes are moist.  Eyes:     Extraocular Movements: Extraocular movements intact.     Pupils: Pupils are equal, round, and reactive to light.  Cardiovascular:     Rate and Rhythm: Normal rate and regular rhythm.     Pulses: Normal pulses.     Heart sounds: Normal heart sounds.  Pulmonary:     Effort: Pulmonary effort is normal.     Breath sounds: Normal breath sounds.  Abdominal:     General: Bowel sounds are normal.     Palpations: Abdomen is soft.     Tenderness: There is no abdominal tenderness.  Musculoskeletal:        General: No swelling.     Right lower leg: No edema.     Left lower leg: No edema.  Lymphadenopathy:     Cervical: No cervical adenopathy.  Skin:    General: Skin is warm and dry.     Comments: Bronzed color to skin, decreased from previous  Neurological:     General: No focal deficit present.     Mental Status: He is alert and oriented to person, place, and time.  Psychiatric:        Mood and Affect: Mood normal.        Behavior: Behavior normal.     LABORATORY DATA:  I have  reviewed the labs as listed.  CBC    Component Value Date/Time   WBC 7.5 10/09/2021 1014   RBC 4.52 10/09/2021 1014   HGB 15.2 10/09/2021 1014   HGB 17.3 05/13/2021 0954   HCT 43.5 10/09/2021 1014   HCT 50.8 05/13/2021 0954   PLT 288 10/09/2021 1014   PLT 325 05/13/2021 0954   MCV 96.2 10/09/2021 1014   MCV 93 05/13/2021 0954   MCH 33.6 10/09/2021 1014   MCHC 34.9 10/09/2021 1014   RDW 11.8 10/09/2021 1014   RDW 12.1 05/13/2021 0954   LYMPHSABS 2.2 09/18/2021 0956   LYMPHSABS  2.0 05/13/2021 0954   MONOABS 0.4 09/18/2021 0956   EOSABS 0.2 09/18/2021 0956   EOSABS 0.2 05/13/2021 0954   BASOSABS 0.0 09/18/2021 0956   BASOSABS 0.1 05/13/2021 0954   CMP Latest Ref Rng & Units 07/23/2021 05/13/2021 02/04/2017  Glucose 65 - 99 mg/dL - 90 95  BUN 6 - 24 mg/dL - 18 10  Creatinine 0.76 - 1.27 mg/dL - 1.02 0.85  Sodium 134 - 144 mmol/L - 138 141  Potassium 3.5 - 5.2 mmol/L - 5.2 4.5  Chloride 96 - 106 mmol/L - 101 106  CO2 20 - 29 mmol/L - 24 23  Calcium 8.7 - 10.2 mg/dL - 10.2 9.4  Total Protein 6.5 - 8.1 g/dL 7.3 7.0 6.4  Total Bilirubin 0.3 - 1.2 mg/dL 0.9 0.7 0.3  Alkaline Phos 38 - 126 U/L 75 105 80  AST 15 - 41 U/L 26 28 28   ALT 0 - 44 U/L 32 24 37    DIAGNOSTIC IMAGING:  I have independently reviewed the relevant imaging and discussed with the patient.  ASSESSMENT & PLAN: 1.  Hereditary hemochromatosis (C282Y homozygous) - Severe hepatic iron deposition was incidental finding on recent MRI abdomen (06/11/2021), which was obtained by PCP for monitoring of previously noted hepatic cyst. - MRI abdomen (06/11/2021) showed severe diffuse hepatic iron deposition, as well as tiny < 5 mm cyst which correlates with lesion seen on prior CT; no evidence of hepatic neoplasm. - Hemochromatosis DNA testing shows homozygosity for C282Y HFE gene (07/23/2021).  No abnormalities on hepatic function panel.  Viral hepatitis panel was negative. - Cardiac MRI (09/16/2021) was negative for cardiac  hemochromatosis. - At presentation, he had symptomatic iron overload with fatigue, joint pain, and abdominal pain after eating    - Have discussed with patient the natural course of untreated hereditary hemochromatosis with risk of cirrhosis, hepatocellular carcinoma, heart failure, and arrhythmia.  Iron overload also increases risk of certain bacterial infections. - We have discussed lifestyle modifications such as avoiding alcohol and iron supplementation.  Recent guidelines do not find that moderate red meat intake adversely affects patients with hemochromatosis.  Patient should avoid raw fish or undercooked meat due to risk of certain bacterial infections to thrive and iron rich environments. - We have discussed phlebotomy protocol for treatment of hemochromatosis: Weekly phlebotomy as tolerated until goal ferritin is reached. If Hgb drops < 11.0, we will decrease frequency of phlebotomy. If patient experiences severe side effects or hemodynamic instability, we will decrease amount taken for phlebotomy. Goal is ferritin < 50 for normalization of iron stores. Once iron stores have normalized, we will decrease frequency of phlebotomy. - Initially started on weekly 500 mL phlebotomy, decreased to weekly 250 mL phlebotomy due to significant fatigue after full phlebotomy. - Symptoms today are improved, as he reports decreased fatigue, decreased joint pain, and decreased skin bronzing - Labs today (11/05/2021): Hgb 15.5, ferritin 632, iron saturation 58%.  Prior labs (07/23/2021) showed Hgb 17.2, ferritin 1042, iron saturation 86% - PLAN: Weekly phlebotomy (250 mL each) as above.  Monthly CBC. - Recommend genetic testing of first-degree relatives (parents, siblings, children), as early detection and treatment can help prevent complications. - We will defer GI referral for now, since there were no signs of liver damage apart from iron overload on imaging or labs - Repeat CBC and iron panel with RTC in 3  months.   2.  Tobacco abuse - Patient has been smoking 1.0-1.5 PPD since age 56. - This patient meets  criteria for low-dose CT lung cancer screening (age 18-80 with a 20+ pack year history, current everyday smoker /OR/ quit < 15 years ago, no current signs or symptoms of lung cancer) - The shared decision making visit discussion included risks and benefits of screening, potential for follow-up, diagnostic testing for abnormal scans, potential for false positive tests, overdiagnosis, discussion about total radiation exposure - Patient stated willingness to undergo diagnostics and treatment as needed - Patient was counseled on smoking cessation to decrease the  risk of lung cancer, pulmonary disease, heart disease, and stroke - Patient has been referred to Lung Cancer Screening Nurse Coordinator for further scheduling of LDCT and for further resources regarding free nicotine replacement therapy and information about smoking cessation classes  - PLAN: Schedule for LDCT chest and refer to Nurse Navigator to coordinate annual CT scans.   3.  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  Prior to that, he worked at a [employer] for 20+ years. - SUBSTANCE: He smokes 1.0-1.5 PPD cigarettes since age 83.  He drinks socially, usually 1 or 2 mixed drinks once or twice a year.  He has a previous history of heavy alcohol use, but quit 26 years ago (age 63).  He denies any illicit drug use. - FAMILY: No family history of liver disease, hemochromatosis, blood disorders, or iron disorders.  Patient's mother had breast cancer.   PLAN SUMMARY & DISPOSITION: Weekly phlebotomy on Friday Monthly CBC LDCT chest for lung cancer screening Same-day labs and office visit on Friday in 3 months  All questions were answered. The patient knows to call the clinic with any problems, questions  or concerns.  Medical decision making: Moderate  Time spent on visit: I spent 20 minutes counseling the patient face to face. The total time spent in the appointment was 30 minutes and more than 50% was on counseling.   Harriett Rush, PA-C  11/06/21 9:49 AM

## 2021-11-06 ENCOUNTER — Inpatient Hospital Stay (HOSPITAL_COMMUNITY): Payer: Managed Care, Other (non HMO)

## 2021-11-06 ENCOUNTER — Other Ambulatory Visit: Payer: Self-pay

## 2021-11-06 ENCOUNTER — Inpatient Hospital Stay (HOSPITAL_COMMUNITY): Payer: Managed Care, Other (non HMO) | Attending: Physician Assistant | Admitting: Physician Assistant

## 2021-11-06 DIAGNOSIS — Z87891 Personal history of nicotine dependence: Secondary | ICD-10-CM | POA: Diagnosis not present

## 2021-11-06 DIAGNOSIS — F17210 Nicotine dependence, cigarettes, uncomplicated: Secondary | ICD-10-CM | POA: Insufficient documentation

## 2021-11-06 LAB — CBC WITH DIFFERENTIAL/PLATELET
Abs Immature Granulocytes: 0.03 10*3/uL (ref 0.00–0.07)
Basophils Absolute: 0 10*3/uL (ref 0.0–0.1)
Basophils Relative: 0 %
Eosinophils Absolute: 0.2 10*3/uL (ref 0.0–0.5)
Eosinophils Relative: 2 %
HCT: 46 % (ref 39.0–52.0)
Hemoglobin: 15.5 g/dL (ref 13.0–17.0)
Immature Granulocytes: 0 %
Lymphocytes Relative: 21 %
Lymphs Abs: 1.9 10*3/uL (ref 0.7–4.0)
MCH: 32 pg (ref 26.0–34.0)
MCHC: 33.7 g/dL (ref 30.0–36.0)
MCV: 95 fL (ref 80.0–100.0)
Monocytes Absolute: 0.6 10*3/uL (ref 0.1–1.0)
Monocytes Relative: 6 %
Neutro Abs: 6.5 10*3/uL (ref 1.7–7.7)
Neutrophils Relative %: 71 %
Platelets: 295 10*3/uL (ref 150–400)
RBC: 4.84 MIL/uL (ref 4.22–5.81)
RDW: 11.4 % — ABNORMAL LOW (ref 11.5–15.5)
WBC: 9.2 10*3/uL (ref 4.0–10.5)
nRBC: 0 % (ref 0.0–0.2)

## 2021-11-06 LAB — IRON AND TIBC
Iron: 155 ug/dL (ref 45–182)
Saturation Ratios: 58 % — ABNORMAL HIGH (ref 17.9–39.5)
TIBC: 268 ug/dL (ref 250–450)
UIBC: 113 ug/dL

## 2021-11-06 LAB — FERRITIN: Ferritin: 632 ng/mL — ABNORMAL HIGH (ref 24–336)

## 2021-11-06 NOTE — Patient Instructions (Signed)
[facility] at [facility] Discharge Instructions  You were seen today by Rojelio Brenner PA-C for your hemochromatosis (iron overload).  Your numbers are looking better!  To treat your hemochromatosis, we will continue to take blood from you once per week, but we will continue to monitor your blood counts to make sure that we do not take too much.  LABS: Return in 1 month for repeat labs and blood count  OTHER TESTS: Screening CT chest for lung cancer risk  TREATMENT: Weekly phlebotomy (taking blood)  FOLLOW-UP APPOINTMENT: Office visit in 3 months   Thank you for choosing [facility] at [facility] to provide your oncology and hematology care.  To afford each patient quality time with our provider, please arrive at least 15 minutes before your scheduled appointment time.   If you have a lab appointment with the Cancer Center please come in thru the Main Entrance and check in at the main information desk.  You need to re-schedule your appointment should you arrive 10 or more minutes late.  We strive to give you quality time with our providers, and arriving late affects you and other patients whose appointments are after yours.  Also, if you no show three or more times for appointments you may be dismissed from the clinic at the providers discretion.     Again, thank you for choosing [facility].  Our hope is that these requests will decrease the amount of time that you wait before being seen by our physicians.       _____________________________________________________________  Should you have questions after your visit to [facility], please contact our office at [phone] and follow the prompts.  Our office hours are 8:00 a.m. and 4:30 p.m. Monday - Friday.  Please note that voicemails left after 4:00 p.m. may not be returned until the following business day.  We are closed weekends and major holidays.  You do  have access to a nurse 24-7, just call the main number to the clinic [phone] and do not press any options, hold on the line and a nurse will answer the phone.    For prescription refill requests, have your pharmacy contact our office and allow 72 hours.    Due to Covid, you will need to wear a mask upon entering the hospital. If you do not have a mask, a mask will be given to you at the Main Entrance upon arrival. For doctor visits, patients may have 1 support person age 25 or older with them. For treatment visits, patients can not have anyone with them due to social distancing guidelines and our immunocompromised population.

## 2021-11-06 NOTE — Progress Notes (Signed)
Garrett Lin presents today for theraputic phlebotomy per MD orders. RN entered the room to start phlebotomy needle inserted into vein pt started yelling "it's hurting stop stop take it out". Charge Nurse entered the room and pt stated he didn't want to do the phlebotomy today. Pt instructed to reschedule with scheduler R.Pennington-PA made aware Discharged in satisfactory condition with follow up instructions.

## 2021-11-13 ENCOUNTER — Inpatient Hospital Stay (HOSPITAL_COMMUNITY): Payer: Managed Care, Other (non HMO)

## 2021-11-13 ENCOUNTER — Other Ambulatory Visit: Payer: Self-pay

## 2021-11-13 NOTE — Progress Notes (Signed)
Garrett Lin presents today for phlebotomy per MD orders. Phlebotomy procedure started at 1109 and ended at 1112. 250 cc removed. Patient tolerated procedure well. IV needle removed intact.   Coban placed on right arm.  No bruising or swelling noted at site.  No complaints voiced.  No s/s of distress noted during or after procedure.  Patient discharged in satisfactory condition with vital signs stable.

## 2021-11-13 NOTE — Patient Instructions (Signed)
[facility]  Discharge Instructions: Thank you for choosing [facility] to provide your oncology and hematology care.  If you have a lab appointment with the Cancer Center, please come in thru the Main Entrance and check in at the main information desk.  Wear comfortable clothing and clothing appropriate for easy access to any Portacath or PICC line.   We strive to give you quality time with your provider. You may need to reschedule your appointment if you arrive late (15 or more minutes).  Arriving late affects you and other patients whose appointments are after yours.  Also, if you miss three or more appointments without notifying the office, you may be dismissed from the clinic at the provider's discretion.      For prescription refill requests, have your pharmacy contact our office and allow 72 hours for refills to be completed.        To help prevent nausea and vomiting after your treatment, we encourage you to take your nausea medication as directed.  BELOW ARE SYMPTOMS THAT SHOULD BE REPORTED IMMEDIATELY: *FEVER GREATER THAN 100.4 F (38 C) OR HIGHER *CHILLS OR SWEATING *NAUSEA AND VOMITING THAT IS NOT CONTROLLED WITH YOUR NAUSEA MEDICATION *UNUSUAL SHORTNESS OF BREATH *UNUSUAL BRUISING OR BLEEDING *URINARY PROBLEMS (pain or burning when urinating, or frequent urination) *BOWEL PROBLEMS (unusual diarrhea, constipation, pain near the anus) TENDERNESS IN MOUTH AND THROAT WITH OR WITHOUT PRESENCE OF ULCERS (sore throat, sores in mouth, or a toothache) UNUSUAL RASH, SWELLING OR PAIN  UNUSUAL VAGINAL DISCHARGE OR ITCHING   Items with * indicate a potential emergency and should be followed up as soon as possible or go to the Emergency Department if any problems should occur.  Please show the CHEMOTHERAPY ALERT CARD or IMMUNOTHERAPY ALERT CARD at check-in to the Emergency Department and triage nurse.  Should you have questions after your visit or need to cancel  or reschedule your appointment, please contact [facility] [phone]  and follow the prompts.  Office hours are 8:00 a.m. to 4:30 p.m. Monday - Friday. Please note that voicemails left after 4:00 p.m. may not be returned until the following business day.  We are closed weekends and major holidays. You have access to a nurse at all times for urgent questions. Please call the main number to the clinic [phone] and follow the prompts.  For any non-urgent questions, you may also contact your provider using MyChart. We now offer e-Visits for anyone 18 and older to request care online for non-urgent symptoms. For details visit [email or web address].   Also download the MyChart app! Go to the app store, search "MyChart", open the app, select [facility], and log in with your MyChart username and password.  Due to Covid, a mask is required upon entering the hospital/clinic. If you do not have a mask, one will be given to you upon arrival. For doctor visits, patients may have 1 support person aged 18 or older with them. For treatment visits, patients cannot have anyone with them due to current Covid guidelines and our immunocompromised population.  

## 2021-11-20 ENCOUNTER — Inpatient Hospital Stay (HOSPITAL_COMMUNITY): Payer: Managed Care, Other (non HMO)

## 2021-11-20 ENCOUNTER — Other Ambulatory Visit: Payer: Self-pay

## 2021-11-20 NOTE — Progress Notes (Signed)
Garrett Lin presents today for phlebotomy per MD orders. Phlebotomy procedure started at 1109 and ended at 1113. 250 mls  removed. Patient refused to stay after procedure. Patient tolerated procedure well. IV needle removed intact.  Patient tolerated it well without problems. Vitals stable and discharged home from clinic ambulatory. Follow up as scheduled.

## 2021-11-20 NOTE — Patient Instructions (Signed)
[facility]  Discharge Instructions: ?Thank you for choosing [facility] to provide your oncology and hematology care.  ?If you have a lab appointment with the Cancer Center, please come in thru the Main Entrance and check in at the main information desk. ? ? ? ?We strive to give you quality time with your provider. You may need to reschedule your appointment if you arrive late (15 or more minutes).  Arriving late affects you and other patients whose appointments are after yours.  Also, if you miss three or more appointments without notifying the office, you may be dismissed from the clinic at the provider?s discretion.    ?  ?For prescription refill requests, have your pharmacy contact our office and allow 72 hours for refills to be completed.   ? ?  ?To help prevent nausea and vomiting after your treatment, we encourage you to take your nausea medication as directed. ? ?BELOW ARE SYMPTOMS THAT SHOULD BE REPORTED IMMEDIATELY: ?*FEVER GREATER THAN 100.4 F (38 ?C) OR HIGHER ?*CHILLS OR SWEATING ?*NAUSEA AND VOMITING THAT IS NOT CONTROLLED WITH YOUR NAUSEA MEDICATION ?*UNUSUAL SHORTNESS OF BREATH ?*UNUSUAL BRUISING OR BLEEDING ?*URINARY PROBLEMS (pain or burning when urinating, or frequent urination) ?*BOWEL PROBLEMS (unusual diarrhea, constipation, pain near the anus) ?TENDERNESS IN MOUTH AND THROAT WITH OR WITHOUT PRESENCE OF ULCERS (sore throat, sores in mouth, or a toothache) ?UNUSUAL RASH, SWELLING OR PAIN  ?UNUSUAL VAGINAL DISCHARGE OR ITCHING  ? ?Items with * indicate a potential emergency and should be followed up as soon as possible or go to the Emergency Department if any problems should occur. ? ?Please show the CHEMOTHERAPY ALERT CARD or IMMUNOTHERAPY ALERT CARD at check-in to the Emergency Department and triage nurse. ? ?Should you have questions after your visit or need to cancel or reschedule your appointment, please contact [facility] [phone]  and follow  the prompts.  Office hours are 8:00 a.m. to 4:30 p.m. Monday - Friday. Please note that voicemails left after 4:00 p.m. may not be returned until the following business day.  We are closed weekends and major holidays. You have access to a nurse at all times for urgent questions. Please call the main number to the clinic [phone] and follow the prompts. ? ?For any non-urgent questions, you may also contact your provider using MyChart. We now offer e-Visits for anyone 18 and older to request care online for non-urgent symptoms. For details visit [email or web address]. ?  ?Also download the MyChart app! Go to the app store, search "MyChart", open the app, select [facility], and log in with your MyChart username and password. ? ?Due to Covid, a mask is required upon entering the hospital/clinic. If you do not have a mask, one will be given to you upon arrival. For doctor visits, patients may have 1 support person aged 18 or older with them. For treatment visits, patients cannot have anyone with them due to current Covid guidelines and our immunocompromised population.  ?

## 2021-11-27 ENCOUNTER — Encounter (HOSPITAL_COMMUNITY): Payer: Self-pay

## 2021-11-27 ENCOUNTER — Other Ambulatory Visit: Payer: Self-pay

## 2021-11-27 ENCOUNTER — Inpatient Hospital Stay (HOSPITAL_COMMUNITY): Payer: Managed Care, Other (non HMO)

## 2021-11-27 NOTE — Patient Instructions (Signed)
[facility]  Discharge Instructions: Thank you for choosing [facility] to provide your oncology and hematology care.  If you have a lab appointment with the Cancer Center, please come in thru the Main Entrance and check in at the main information desk.  Wear comfortable clothing and clothing appropriate for easy access to any Portacath or PICC line.   We strive to give you quality time with your provider. You may need to reschedule your appointment if you arrive late (15 or more minutes).  Arriving late affects you and other patients whose appointments are after yours.  Also, if you miss three or more appointments without notifying the office, you may be dismissed from the clinic at the provider's discretion.      For prescription refill requests, have your pharmacy contact our office and allow 72 hours for refills to be completed.    Today you received the following:Phlebotomy.      To help prevent nausea and vomiting after your treatment, we encourage you to take your nausea medication as directed.  BELOW ARE SYMPTOMS THAT SHOULD BE REPORTED IMMEDIATELY: *FEVER GREATER THAN 100.4 F (38 C) OR HIGHER *CHILLS OR SWEATING *NAUSEA AND VOMITING THAT IS NOT CONTROLLED WITH YOUR NAUSEA MEDICATION *UNUSUAL SHORTNESS OF BREATH *UNUSUAL BRUISING OR BLEEDING *URINARY PROBLEMS (pain or burning when urinating, or frequent urination) *BOWEL PROBLEMS (unusual diarrhea, constipation, pain near the anus) TENDERNESS IN MOUTH AND THROAT WITH OR WITHOUT PRESENCE OF ULCERS (sore throat, sores in mouth, or a toothache) UNUSUAL RASH, SWELLING OR PAIN  UNUSUAL VAGINAL DISCHARGE OR ITCHING   Items with * indicate a potential emergency and should be followed up as soon as possible or go to the Emergency Department if any problems should occur.  Please show the CHEMOTHERAPY ALERT CARD or IMMUNOTHERAPY ALERT CARD at check-in to the Emergency Department and triage nurse.  Should you  have questions after your visit or need to cancel or reschedule your appointment, please contact [facility] [phone]  and follow the prompts.  Office hours are 8:00 a.m. to 4:30 p.m. Monday - Friday. Please note that voicemails left after 4:00 p.m. may not be returned until the following business day.  We are closed weekends and major holidays. You have access to a nurse at all times for urgent questions. Please call the main number to the clinic [phone] and follow the prompts.  For any non-urgent questions, you may also contact your provider using MyChart. We now offer e-Visits for anyone 18 and older to request care online for non-urgent symptoms. For details visit [email or web address].   Also download the MyChart app! Go to the app store, search "MyChart", open the app, select [place], and log in with your MyChart username and password.  Due to Covid, a mask is required upon entering the hospital/clinic. If you do not have a mask, one will be given to you upon arrival. For doctor visits, patients may have 1 support person aged 18 or older with them. For treatment visits, patients cannot have anyone with them due to current Covid guidelines and our immunocompromised population.  

## 2021-11-27 NOTE — Progress Notes (Signed)
Garrett Lin presents for therapeutic phlebotomy per MD orders. Last HGB 15.5 / HCT 46 on 11/06/2021 . Vital signs stable prior to procedure. Procedure started at 10:54 am and ended at 10:57am. 500 mls of blood removed. Patient denies any dizziness , lightheadedness, or feeling faint.  Gauze and coban applied to site. Vital signs stable at completion of procedure. Patient has no complaints at this time. Alert and oriented x 3. Discharged in stable condition.

## 2021-12-04 ENCOUNTER — Inpatient Hospital Stay (HOSPITAL_COMMUNITY): Payer: Managed Care, Other (non HMO)

## 2021-12-04 ENCOUNTER — Other Ambulatory Visit: Payer: Self-pay

## 2021-12-04 ENCOUNTER — Inpatient Hospital Stay (HOSPITAL_COMMUNITY): Payer: Managed Care, Other (non HMO) | Attending: Hematology

## 2021-12-04 LAB — CBC
HCT: 47.6 % (ref 39.0–52.0)
Hemoglobin: 15.8 g/dL (ref 13.0–17.0)
MCH: 31.3 pg (ref 26.0–34.0)
MCHC: 33.2 g/dL (ref 30.0–36.0)
MCV: 94.4 fL (ref 80.0–100.0)
Platelets: 266 10*3/uL (ref 150–400)
RBC: 5.04 MIL/uL (ref 4.22–5.81)
RDW: 11.4 % — ABNORMAL LOW (ref 11.5–15.5)
WBC: 7.3 10*3/uL (ref 4.0–10.5)
nRBC: 0 % (ref 0.0–0.2)

## 2021-12-04 NOTE — Progress Notes (Signed)
Patient presents today for therapeutic phlebotomy.  Patient is in satisfactory condition with no complaints voiced.  Vital signs are stable.  Hemoglobin today is 15.8 and hematocrit is 47.6.  We will proceed with phlebotomy per provider's orders.  ? ?Patient tolerated therapeutic phlebotomy well.  Phlebotomy started at 1027 and ended at 1034.  Orders state to draw off 250 mL of blood, but blood flow had decreased severely and patient started to complain of pain and wanted to stop the phlebotomy.  200 mL of blood was removed during this phlebotomy.  Vitals were stable post-phlebotomy.  Patient refused to wait the recommended 30 minute post-phlebotomy wait time.  He waited about 15 minutes and left.  Patient left in stable condition with no signs of distress.   ?

## 2021-12-04 NOTE — Patient Instructions (Signed)
[facility]  Discharge Instructions: ?Thank you for choosing [facility] to provide your oncology and hematology care.  ?If you have a lab appointment with the Cancer Center, please come in thru the Main Entrance and check in at the main information desk. ? ?Wear comfortable clothing and clothing appropriate for easy access to any Portacath or PICC line.  ? ?We strive to give you quality time with your provider. You may need to reschedule your appointment if you arrive late (15 or more minutes).  Arriving late affects you and other patients whose appointments are after yours.  Also, if you miss three or more appointments without notifying the office, you may be dismissed from the clinic at the provider?s discretion.    ?  ?For prescription refill requests, have your pharmacy contact our office and allow 72 hours for refills to be completed.   ? ?Today you had a therapeutic phlebotomy procedure.   ?  ?To help prevent nausea and vomiting after your treatment, we encourage you to take your nausea medication as directed. ? ?BELOW ARE SYMPTOMS THAT SHOULD BE REPORTED IMMEDIATELY: ?*FEVER GREATER THAN 100.4 F (38 ?C) OR HIGHER ?*CHILLS OR SWEATING ?*NAUSEA AND VOMITING THAT IS NOT CONTROLLED WITH YOUR NAUSEA MEDICATION ?*UNUSUAL SHORTNESS OF BREATH ?*UNUSUAL BRUISING OR BLEEDING ?*URINARY PROBLEMS (pain or burning when urinating, or frequent urination) ?*BOWEL PROBLEMS (unusual diarrhea, constipation, pain near the anus) ?TENDERNESS IN MOUTH AND THROAT WITH OR WITHOUT PRESENCE OF ULCERS (sore throat, sores in mouth, or a toothache) ?UNUSUAL RASH, SWELLING OR PAIN  ?UNUSUAL VAGINAL DISCHARGE OR ITCHING  ? ?Items with * indicate a potential emergency and should be followed up as soon as possible or go to the Emergency Department if any problems should occur. ? ?Please show the CHEMOTHERAPY ALERT CARD or IMMUNOTHERAPY ALERT CARD at check-in to the Emergency Department and triage nurse. ? ?Should  you have questions after your visit or need to cancel or reschedule your appointment, please contact [facility] [phone]  and follow the prompts.  Office hours are 8:00 a.m. to 4:30 p.m. Monday - Friday. Please note that voicemails left after 4:00 p.m. may not be returned until the following business day.  We are closed weekends and major holidays. You have access to a nurse at all times for urgent questions. Please call the main number to the clinic [phone] and follow the prompts. ? ?For any non-urgent questions, you may also contact your provider using MyChart. We now offer e-Visits for anyone 9 and older to request care online for non-urgent symptoms. For details visit [email or web address]. ?  ?Also download the MyChart app! Go to the app store, search "MyChart", open the app, select , and log in with your MyChart username and password. ? ?Due to Covid, a mask is required upon entering the hospital/clinic. If you do not have a mask, one will be given to you upon arrival. For doctor visits, patients may have 1 support person aged 53 or older with them. For treatment visits, patients cannot have anyone with them due to current Covid guidelines and our immunocompromised population.  ?

## 2021-12-11 ENCOUNTER — Other Ambulatory Visit: Payer: Self-pay

## 2021-12-11 ENCOUNTER — Encounter (HOSPITAL_COMMUNITY): Payer: Self-pay

## 2021-12-11 ENCOUNTER — Inpatient Hospital Stay (HOSPITAL_COMMUNITY): Payer: Managed Care, Other (non HMO)

## 2021-12-11 NOTE — Progress Notes (Signed)
Garrett Lin presents today for phlebotomy per MD orders. ?Phlebotomy procedure started at 1113 and ended at 1117. ?250 cc removed. ?Patient tolerated procedure well. ?IV needle removed intact. ? ?Right arm site clean and dry with gauze and coban applied.  No complaints during or after procedure.  No s/s of distress.  VSS with discharge and left in satisfactory condition.  ? ?

## 2021-12-11 NOTE — Patient Instructions (Signed)
[facility]  Discharge Instructions: ?Thank you for choosing [facility] to provide your oncology and hematology care.  ?If you have a lab appointment with the Cancer Center, please come in thru the Main Entrance and check in at the main information desk. ? ?Wear comfortable clothing and clothing appropriate for easy access to any Portacath or PICC line.  ? ?We strive to give you quality time with your provider. You may need to reschedule your appointment if you arrive late (15 or more minutes).  Arriving late affects you and other patients whose appointments are after yours.  Also, if you miss three or more appointments without notifying the office, you may be dismissed from the clinic at the provider?s discretion.    ?  ?For prescription refill requests, have your pharmacy contact our office and allow 72 hours for refills to be completed.   ? ?Therapeutic Phlebotomy, Care After ?The following information offers guidance on how to care for yourself after your procedure. Your health care provider may also give you more specific instructions. If you have problems or questions, contact your health care provider. ?What can I expect after the procedure? ?After therapeutic phlebotomy, it is common to have: ?Light-headedness or dizziness. You may feel faint. ?Nausea. ?Tiredness (fatigue). ?Follow these instructions at home: ?Eating and drinking ?Be sure to eat well-balanced meals for the next 24 hours. ?Drink enough fluid to keep your urine pale yellow. ?Avoid drinking alcohol on the day that you had the procedure. ?Activity ? ?Return to your normal activities as told by your health care provider. Most people can go back to their normal activities right away. ?Avoid activities that take a lot of effort for about 5 hours after the procedure. Athletes should avoid strenuous exercise for at least 12 hours. ?Avoid heavy lifting or pulling for about 5 hours after the procedure. Do not lift anything  that is heavier than 10 lb (4.5 kg). ?Change positions slowly for the remainder of the day, like from sitting to standing. This can help prevent light-headedness or fainting. ?If you feel light-headed, lie down until the feeling goes away. ?Needle insertion site care ? ?Keep your bandage (dressing) dry. You can remove the bandage after about 5 hours or as told by your health care provider. ?If you have bleeding from the needle insertion site, raise (elevate) your arm and press firmly on the site until the bleeding stops. ?If you have bruising at the site, apply ice to the area. To do this: ?Put ice in a plastic bag. ?Place a towel between your skin and the bag. ?Leave the ice on for 20 minutes, 2-3 times a day for the first 24 hours. ?Remove the ice if your skin turns bright red so you do not damage the area. ?If the swelling does not go away after 24 hours, apply a warm, moist cloth (warm compress) to the area for 20 minutes, 2-3 times a day. ?General instructions ?Do not use any products that contain nicotine or tobacco, like cigarettes, chewing tobacco, and vaping devices, such as e-cigarettes, for at least 30 minutes after the procedure. If you need help quitting, ask your health care provider. ?Keep all follow-up visits. You may need to continue having regular blood tests and therapeutic phlebotomy treatments as directed. ?Contact a health care provider if: ?You have redness, swelling, or pain at the needle insertion site. ?Fluid or blood is coming from the needle insertion site. ?Pus or a bad smell is coming from the  needle insertion site. ?The needle insertion site feels warm to the touch. ?You feel light-headed, dizzy, or nauseous, and the feeling does not go away. ?You have new bruising at the needle insertion site. ?You feel weaker than normal. ?You have a fever or chills. ?Get help right away if: ?You have chest pain. ?You have trouble breathing. ?You have severe nausea or vomiting. ?Summary ?After the  procedure, it is common to have some light-headedness, dizziness, nausea, or tiredness (fatigue). ?Be sure to eat well-balanced meals for the next 24 hours. Drink enough fluid to keep your urine pale yellow. ?Return to your normal activities as told by your health care provider. ?Keep all follow-up visits. You may need to continue having regular blood tests and therapeutic phlebotomy treatments as directed. ?This information is not intended to replace advice given to you by your health care provider. Make sure you discuss any questions you have with your health care provider. ?Document Revised: 03/18/2021 Document Reviewed: 03/18/2021 ?Elsevier Patient Education ? 2022 Elsevier Inc[employer] ? ?  ?To help prevent nausea and vomiting after your treatment, we encourage you to take your nausea medication as directed. ? ?BELOW ARE SYMPTOMS THAT SHOULD BE REPORTED IMMEDIATELY: ?*FEVER GREATER THAN 100.4 F (38 ?C) OR HIGHER ?*CHILLS OR SWEATING ?*NAUSEA AND VOMITING THAT IS NOT CONTROLLED WITH YOUR NAUSEA MEDICATION ?*UNUSUAL SHORTNESS OF BREATH ?*UNUSUAL BRUISING OR BLEEDING ?*URINARY PROBLEMS (pain or burning when urinating, or frequent urination) ?*BOWEL PROBLEMS (unusual diarrhea, constipation, pain near the anus) ?TENDERNESS IN MOUTH AND THROAT WITH OR WITHOUT PRESENCE OF ULCERS (sore throat, sores in mouth, or a toothache) ?UNUSUAL RASH, SWELLING OR PAIN  ?UNUSUAL VAGINAL DISCHARGE OR ITCHING  ? ?Items with * indicate a potential emergency and should be followed up as soon as possible or go to the Emergency Department if any problems should occur. ? ?Please show the CHEMOTHERAPY ALERT CARD or IMMUNOTHERAPY ALERT CARD at check-in to the Emergency Department and triage nurse. ? ?Should you have questions after your visit or need to cancel or reschedule your appointment, please contact [facility] [phone]  and follow the prompts.  Office hours are 8:00 a.m. to 4:30 p.m. Monday - Friday. Please note that  voicemails left after 4:00 p.m. may not be returned until the following business day.  We are closed weekends and major holidays. You have access to a nurse at all times for urgent questions. Please call the main number to the clinic [phone] and follow the prompts. ? ?For any non-urgent questions, you may also contact your provider using MyChart. We now offer e-Visits for anyone 78 and older to request care online for non-urgent symptoms. For details visit [email or web address]. ?  ?Also download the MyChart app! Go to the app store, search "MyChart", open the app, select [place], and log in with your MyChart username and password. ? ?Due to Covid, a mask is required upon entering the hospital/clinic. If you do not have a mask, one will be given to you upon arrival. For doctor visits, patients may have 1 support person aged 84 or older with them. For treatment visits, patients cannot have anyone with them due to current Covid guidelines and our immunocompromised population.  ?

## 2021-12-18 ENCOUNTER — Other Ambulatory Visit: Payer: Self-pay

## 2021-12-18 ENCOUNTER — Inpatient Hospital Stay (HOSPITAL_COMMUNITY): Payer: Managed Care, Other (non HMO)

## 2021-12-18 ENCOUNTER — Encounter (HOSPITAL_COMMUNITY): Payer: Self-pay

## 2021-12-18 NOTE — Patient Instructions (Signed)
[facility]  Discharge Instructions: ?Thank you for choosing [facility] to provide your oncology and hematology care.  ?If you have a lab appointment with the Cancer Center, please come in thru the Main Entrance and check in at the main information desk. ? ?Wear comfortable clothing and clothing appropriate for easy access to any Portacath or PICC line.  ? ?We strive to give you quality time with your provider. You may need to reschedule your appointment if you arrive late (15 or more minutes).  Arriving late affects you and other patients whose appointments are after yours.  Also, if you miss three or more appointments without notifying the office, you may be dismissed from the clinic at the provider?s discretion.    ?  ?For prescription refill requests, have your pharmacy contact our office and allow 72 hours for refills to be completed.   ? ?Today you received a phlebotomy.    ?  ?To help prevent nausea and vomiting after your treatment, we encourage you to take your nausea medication as directed. ? ?BELOW ARE SYMPTOMS THAT SHOULD BE REPORTED IMMEDIATELY: ?*FEVER GREATER THAN 100.4 F (38 ?C) OR HIGHER ?*CHILLS OR SWEATING ?*NAUSEA AND VOMITING THAT IS NOT CONTROLLED WITH YOUR NAUSEA MEDICATION ?*UNUSUAL SHORTNESS OF BREATH ?*UNUSUAL BRUISING OR BLEEDING ?*URINARY PROBLEMS (pain or burning when urinating, or frequent urination) ?*BOWEL PROBLEMS (unusual diarrhea, constipation, pain near the anus) ?TENDERNESS IN MOUTH AND THROAT WITH OR WITHOUT PRESENCE OF ULCERS (sore throat, sores in mouth, or a toothache) ?UNUSUAL RASH, SWELLING OR PAIN  ?UNUSUAL VAGINAL DISCHARGE OR ITCHING  ? ?Items with * indicate a potential emergency and should be followed up as soon as possible or go to the Emergency Department if any problems should occur. ? ?Please show the CHEMOTHERAPY ALERT CARD or IMMUNOTHERAPY ALERT CARD at check-in to the Emergency Department and triage nurse. ? ?Should you have questions  after your visit or need to cancel or reschedule your appointment, please contact [facility] [phone]  and follow the prompts.  Office hours are 8:00 a.m. to 4:30 p.m. Monday - Friday. Please note that voicemails left after 4:00 p.m. may not be returned until the following business day.  We are closed weekends and major holidays. You have access to a nurse at all times for urgent questions. Please call the main number to the clinic [phone] and follow the prompts. ? ?For any non-urgent questions, you may also contact your provider using MyChart. We now offer e-Visits for anyone 30 and older to request care online for non-urgent symptoms. For details visit [email or web address]. ?  ?Also download the MyChart app! Go to the app store, search "MyChart", open the app, select [place], and log in with your MyChart username and password. ? ?Due to Covid, a mask is required upon entering the hospital/clinic. If you do not have a mask, one will be given to you upon arrival. For doctor visits, patients may have 1 support person aged 79 or older with them. For treatment visits, patients cannot have anyone with them due to current Covid guidelines and our immunocompromised population.  ?

## 2021-12-18 NOTE — Progress Notes (Signed)
Garrett Lin presents for therapeutic phlebotomy per MD orders. Last HGB 15.8 / HCT 47.6 on 12/04/2021 . Vital signs stable prior to procedure. Procedure started at 11:09 am and ended at 11:11 am. 250 mls of blood removed. Patient denies any dizziness , lightheadedness, or feeling faint.  ?Gauze and coban applied to site. Vital signs stable at completion of procedure. Patient has no complaints at this time. Alert and oriented x 3. Discharged in stable condition.   ?

## 2021-12-25 ENCOUNTER — Encounter (HOSPITAL_COMMUNITY): Payer: Managed Care, Other (non HMO)

## 2021-12-25 ENCOUNTER — Ambulatory Visit (HOSPITAL_COMMUNITY): Payer: Managed Care, Other (non HMO)

## 2022-01-01 ENCOUNTER — Inpatient Hospital Stay (HOSPITAL_COMMUNITY): Payer: Managed Care, Other (non HMO)

## 2022-01-01 NOTE — Progress Notes (Addendum)
Patient presents today for phlebotomy per MD orders. ?Phlebotomy procedure started at 1104 and ended at 1110. ?250 cc removed. ?Patient tolerated procedure well.  Procedure tolerated well and without incident.  Discharged ambulatory in stable condition.   ?

## 2022-01-01 NOTE — Patient Instructions (Signed)
[facility]  Discharge Instructions: °Thank you for choosing [facility] to provide your oncology and hematology care.  °If you have a lab appointment with the Cancer Center, please come in thru the Main Entrance and check in at the main information desk. ° °Wear comfortable clothing and clothing appropriate for easy access to any Portacath or PICC line.  ° °We strive to give you quality time with your provider. You may need to reschedule your appointment if you arrive late (15 or more minutes).  Arriving late affects you and other patients whose appointments are after yours.  Also, if you miss three or more appointments without notifying the office, you may be dismissed from the clinic at the provider’s discretion.    °  °For prescription refill requests, have your pharmacy contact our office and allow 72 hours for refills to be completed.   ° °Today you received the following chemotherapy and/or immunotherapy agents Phlebotomy    °  °To help prevent nausea and vomiting after your treatment, we encourage you to take your nausea medication as directed. ° °BELOW ARE SYMPTOMS THAT SHOULD BE REPORTED IMMEDIATELY: °*FEVER GREATER THAN 100.4 F (38 °C) OR HIGHER °*CHILLS OR SWEATING °*NAUSEA AND VOMITING THAT IS NOT CONTROLLED WITH YOUR NAUSEA MEDICATION °*UNUSUAL SHORTNESS OF BREATH °*UNUSUAL BRUISING OR BLEEDING °*URINARY PROBLEMS (pain or burning when urinating, or frequent urination) °*BOWEL PROBLEMS (unusual diarrhea, constipation, pain near the anus) °TENDERNESS IN MOUTH AND THROAT WITH OR WITHOUT PRESENCE OF ULCERS (sore throat, sores in mouth, or a toothache) °UNUSUAL RASH, SWELLING OR PAIN  °UNUSUAL VAGINAL DISCHARGE OR ITCHING  ° °Items with * indicate a potential emergency and should be followed up as soon as possible or go to the Emergency Department if any problems should occur. ° °Please show the CHEMOTHERAPY ALERT CARD or IMMUNOTHERAPY ALERT CARD at check-in to the Emergency  Department and triage nurse. ° °Should you have questions after your visit or need to cancel or reschedule your appointment, please contact [facility] [phone]  and follow the prompts.  Office hours are 8:00 a.m. to 4:30 p.m. Monday - Friday. Please note that voicemails left after 4:00 p.m. may not be returned until the following business day.  We are closed weekends and major holidays. You have access to a nurse at all times for urgent questions. Please call the main number to the clinic [phone] and follow the prompts. ° °For any non-urgent questions, you may also contact your provider using MyChart. We now offer e-Visits for anyone 18 and older to request care online for non-urgent symptoms. For details visit [email or web address]. °  °Also download the MyChart app! Go to the app store, search "MyChart", open the app, select [place], and log in with your MyChart username and password. ° °Due to Covid, a mask is required upon entering the hospital/clinic. If you do not have a mask, one will be given to you upon arrival. For doctor visits, patients may have 1 support person aged 18 or older with them. For treatment visits, patients cannot have anyone with them due to current Covid guidelines and our immunocompromised population.  °

## 2022-01-07 ENCOUNTER — Encounter (HOSPITAL_COMMUNITY): Payer: Managed Care, Other (non HMO)

## 2022-01-07 ENCOUNTER — Other Ambulatory Visit (HOSPITAL_COMMUNITY): Payer: Managed Care, Other (non HMO)

## 2022-01-15 ENCOUNTER — Inpatient Hospital Stay (HOSPITAL_COMMUNITY): Payer: Managed Care, Other (non HMO)

## 2022-01-15 ENCOUNTER — Inpatient Hospital Stay (HOSPITAL_COMMUNITY): Payer: Managed Care, Other (non HMO) | Attending: Hematology

## 2022-01-15 LAB — CBC
HCT: 46.9 % (ref 39.0–52.0)
Hemoglobin: 16.1 g/dL (ref 13.0–17.0)
MCH: 31.7 pg (ref 26.0–34.0)
MCHC: 34.3 g/dL (ref 30.0–36.0)
MCV: 92.3 fL (ref 80.0–100.0)
Platelets: 300 10*3/uL (ref 150–400)
RBC: 5.08 MIL/uL (ref 4.22–5.81)
RDW: 11.3 % — ABNORMAL LOW (ref 11.5–15.5)
WBC: 8.7 10*3/uL (ref 4.0–10.5)
nRBC: 0 % (ref 0.0–0.2)

## 2022-01-15 NOTE — Progress Notes (Signed)
Patient presents today for therapeutic phlebotomy.  Patient is in satisfactory condition with no complaints.  Vital signs are stable.  Labs reviewed.  Hemoglobin today is 16.4 and hematocrit is 46.9.  We will proceed with phlebotomy per MD orders.  ? ?Therapeutic phlebotomy started at 1044 and ended at 1049.  250 mL of blood were removed.  Vital signs remain stable.  ? ?Patient refused recommended 30 minute wait time, but agreed to stay about 10 minutes. Patient left ambulatory in stable condition with no complaints voiced.   ?

## 2022-01-15 NOTE — Patient Instructions (Signed)
[facility]  Discharge Instructions: Thank you for choosing [facility] to provide your oncology and hematology care.  If you have a lab appointment with the Cancer Center, please come in thru the Main Entrance and check in at the main information desk.  Wear comfortable clothing and clothing appropriate for easy access to any Portacath or PICC line.   We strive to give you quality time with your provider. You may need to reschedule your appointment if you arrive late (15 or more minutes).  Arriving late affects you and other patients whose appointments are after yours.  Also, if you miss three or more appointments without notifying the office, you may be dismissed from the clinic at the provider's discretion.      For prescription refill requests, have your pharmacy contact our office and allow 72 hours for refills to be completed.        To help prevent nausea and vomiting after your treatment, we encourage you to take your nausea medication as directed.  BELOW ARE SYMPTOMS THAT SHOULD BE REPORTED IMMEDIATELY: *FEVER GREATER THAN 100.4 F (38 C) OR HIGHER *CHILLS OR SWEATING *NAUSEA AND VOMITING THAT IS NOT CONTROLLED WITH YOUR NAUSEA MEDICATION *UNUSUAL SHORTNESS OF BREATH *UNUSUAL BRUISING OR BLEEDING *URINARY PROBLEMS (pain or burning when urinating, or frequent urination) *BOWEL PROBLEMS (unusual diarrhea, constipation, pain near the anus) TENDERNESS IN MOUTH AND THROAT WITH OR WITHOUT PRESENCE OF ULCERS (sore throat, sores in mouth, or a toothache) UNUSUAL RASH, SWELLING OR PAIN  UNUSUAL VAGINAL DISCHARGE OR ITCHING   Items with * indicate a potential emergency and should be followed up as soon as possible or go to the Emergency Department if any problems should occur.  Please show the CHEMOTHERAPY ALERT CARD or IMMUNOTHERAPY ALERT CARD at check-in to the Emergency Department and triage nurse.  Should you have questions after your visit or need to cancel  or reschedule your appointment, please contact [facility] [phone]  and follow the prompts.  Office hours are 8:00 a.m. to 4:30 p.m. Monday - Friday. Please note that voicemails left after 4:00 p.m. may not be returned until the following business day.  We are closed weekends and major holidays. You have access to a nurse at all times for urgent questions. Please call the main number to the clinic [phone] and follow the prompts.  For any non-urgent questions, you may also contact your provider using MyChart. We now offer e-Visits for anyone 18 and older to request care online for non-urgent symptoms. For details visit [email or web address].   Also download the MyChart app! Go to the app store, search "MyChart", open the app, select [place], and log in with your MyChart username and password.  Due to Covid, a mask is required upon entering the hospital/clinic. If you do not have a mask, one will be given to you upon arrival. For doctor visits, patients may have 1 support person aged 18 or older with them. For treatment visits, patients cannot have anyone with them due to current Covid guidelines and our immunocompromised population.  

## 2022-01-22 ENCOUNTER — Inpatient Hospital Stay (HOSPITAL_COMMUNITY): Payer: Managed Care, Other (non HMO)

## 2022-01-22 NOTE — Progress Notes (Signed)
Garrett Lin presents for therapeutic phlebotomy per MD orders. Last HGB 16.1 / HCT 46.9 on 01/15/2022 . Vital signs stable prior to procedure. Procedure started at 11:09 am and ended at 11:12 am. 250 mls of blood removed. Patient denies any dizziness , lightheadedness, or feeling faint.  ?Gauze and coban applied to site. Vital signs stable at completion of procedure. Patient has no complaints at this time. Alert and oriented x 3. Discharged in stable condition. Patient does not wait 30 minutes post phlebotomy per his request.   ?

## 2022-01-22 NOTE — Patient Instructions (Signed)
[facility]  Discharge Instructions: ?Thank you for choosing [facility] to provide your oncology and hematology care.  ?If you have a lab appointment with the Cancer Center, please come in thru the Main Entrance and check in at the main information desk. ? ?Wear comfortable clothing and clothing appropriate for easy access to any Portacath or PICC line.  ? ?We strive to give you quality time with your provider. You may need to reschedule your appointment if you arrive late (15 or more minutes).  Arriving late affects you and other patients whose appointments are after yours.  Also, if you miss three or more appointments without notifying the office, you may be dismissed from the clinic at the provider?s discretion.    ?  ?For prescription refill requests, have your pharmacy contact our office and allow 72 hours for refills to be completed.   ? ?Today you received the following : Therapeutic phlebotomy.     ?  ?To help prevent nausea and vomiting after your treatment, we encourage you to take your nausea medication as directed. ? ?BELOW ARE SYMPTOMS THAT SHOULD BE REPORTED IMMEDIATELY: ?*FEVER GREATER THAN 100.4 F (38 ?C) OR HIGHER ?*CHILLS OR SWEATING ?*NAUSEA AND VOMITING THAT IS NOT CONTROLLED WITH YOUR NAUSEA MEDICATION ?*UNUSUAL SHORTNESS OF BREATH ?*UNUSUAL BRUISING OR BLEEDING ?*URINARY PROBLEMS (pain or burning when urinating, or frequent urination) ?*BOWEL PROBLEMS (unusual diarrhea, constipation, pain near the anus) ?TENDERNESS IN MOUTH AND THROAT WITH OR WITHOUT PRESENCE OF ULCERS (sore throat, sores in mouth, or a toothache) ?UNUSUAL RASH, SWELLING OR PAIN  ?UNUSUAL VAGINAL DISCHARGE OR ITCHING  ? ?Items with * indicate a potential emergency and should be followed up as soon as possible or go to the Emergency Department if any problems should occur. ? ?Please show the CHEMOTHERAPY ALERT CARD or IMMUNOTHERAPY ALERT CARD at check-in to the Emergency Department and triage  nurse. ? ?Should you have questions after your visit or need to cancel or reschedule your appointment, please contact [facility] [phone]  and follow the prompts.  Office hours are 8:00 a.m. to 4:30 p.m. Monday - Friday. Please note that voicemails left after 4:00 p.m. may not be returned until the following business day.  We are closed weekends and major holidays. You have access to a nurse at all times for urgent questions. Please call the main number to the clinic [phone] and follow the prompts. ? ?For any non-urgent questions, you may also contact your provider using MyChart. We now offer e-Visits for anyone 27 and older to request care online for non-urgent symptoms. For details visit [email or web address]. ?  ?Also download the MyChart app! Go to the app store, search "MyChart", open the app, select [place], and log in with your MyChart username and password. ? ?Due to Covid, a mask is required upon entering the hospital/clinic. If you do not have a mask, one will be given to you upon arrival. For doctor visits, patients may have 1 support person aged 65 or older with them. For treatment visits, patients cannot have anyone with them due to current Covid guidelines and our immunocompromised population.  ?

## 2022-01-29 ENCOUNTER — Inpatient Hospital Stay (HOSPITAL_COMMUNITY): Payer: Managed Care, Other (non HMO)

## 2022-01-29 ENCOUNTER — Encounter (HOSPITAL_COMMUNITY): Payer: Self-pay

## 2022-01-29 NOTE — Progress Notes (Signed)
[place] presents for therapeutic phlebotomy per MD orders. Last HGB 16.1 / HCT 46.9 on 01/15/2022 . Vital signs stable prior to procedure. Procedure started at 11:15 am and ended at 11:20 am. 250 mls of blood removed. Patient denies any dizziness , lightheadedness, or feeling faint.  ?Gauze and coban applied to site. Vital signs stable at completion of procedure. Patient has no complaints at this time. Alert and oriented x 3. Discharged in stable condition.   ?

## 2022-01-29 NOTE — Patient Instructions (Signed)
[facility]  Discharge Instructions: °Thank you for choosing [facility] to provide your oncology and hematology care.  °If you have a lab appointment with the Cancer Center, please come in thru the Main Entrance and check in at the main information desk. ° °Wear comfortable clothing and clothing appropriate for easy access to any Portacath or PICC line.  ° °We strive to give you quality time with your provider. You may need to reschedule your appointment if you arrive late (15 or more minutes).  Arriving late affects you and other patients whose appointments are after yours.  Also, if you miss three or more appointments without notifying the office, you may be dismissed from the clinic at the provider’s discretion.    °  °For prescription refill requests, have your pharmacy contact our office and allow 72 hours for refills to be completed.   ° °Today you received the following chemotherapy and/or immunotherapy agents Phlebotomy    °  °To help prevent nausea and vomiting after your treatment, we encourage you to take your nausea medication as directed. ° °BELOW ARE SYMPTOMS THAT SHOULD BE REPORTED IMMEDIATELY: °*FEVER GREATER THAN 100.4 F (38 °C) OR HIGHER °*CHILLS OR SWEATING °*NAUSEA AND VOMITING THAT IS NOT CONTROLLED WITH YOUR NAUSEA MEDICATION °*UNUSUAL SHORTNESS OF BREATH °*UNUSUAL BRUISING OR BLEEDING °*URINARY PROBLEMS (pain or burning when urinating, or frequent urination) °*BOWEL PROBLEMS (unusual diarrhea, constipation, pain near the anus) °TENDERNESS IN MOUTH AND THROAT WITH OR WITHOUT PRESENCE OF ULCERS (sore throat, sores in mouth, or a toothache) °UNUSUAL RASH, SWELLING OR PAIN  °UNUSUAL VAGINAL DISCHARGE OR ITCHING  ° °Items with * indicate a potential emergency and should be followed up as soon as possible or go to the Emergency Department if any problems should occur. ° °Please show the CHEMOTHERAPY ALERT CARD or IMMUNOTHERAPY ALERT CARD at check-in to the Emergency  Department and triage nurse. ° °Should you have questions after your visit or need to cancel or reschedule your appointment, please contact [facility] [phone]  and follow the prompts.  Office hours are 8:00 a.m. to 4:30 p.m. Monday - Friday. Please note that voicemails left after 4:00 p.m. may not be returned until the following business day.  We are closed weekends and major holidays. You have access to a nurse at all times for urgent questions. Please call the main number to the clinic [phone] and follow the prompts. ° °For any non-urgent questions, you may also contact your provider using MyChart. We now offer e-Visits for anyone 18 and older to request care online for non-urgent symptoms. For details visit [email or web address]. °  °Also download the MyChart app! Go to the app store, search "MyChart", open the app, select [place], and log in with your MyChart username and password. ° °Due to Covid, a mask is required upon entering the hospital/clinic. If you do not have a mask, one will be given to you upon arrival. For doctor visits, patients may have 1 support person aged 18 or older with them. For treatment visits, patients cannot have anyone with them due to current Covid guidelines and our immunocompromised population.  °

## 2022-02-03 NOTE — Progress Notes (Deleted)
CANCEL 

## 2022-02-04 ENCOUNTER — Inpatient Hospital Stay (HOSPITAL_COMMUNITY): Payer: Managed Care, Other (non HMO)

## 2022-02-04 ENCOUNTER — Inpatient Hospital Stay (HOSPITAL_COMMUNITY): Payer: Managed Care, Other (non HMO) | Admitting: Physician Assistant

## 2022-02-05 ENCOUNTER — Encounter (HOSPITAL_COMMUNITY): Payer: Managed Care, Other (non HMO)

## 2022-02-05 ENCOUNTER — Ambulatory Visit (HOSPITAL_COMMUNITY): Payer: Managed Care, Other (non HMO) | Admitting: Physician Assistant

## 2022-02-05 ENCOUNTER — Other Ambulatory Visit (HOSPITAL_COMMUNITY): Payer: Managed Care, Other (non HMO)

## 2022-02-12 ENCOUNTER — Ambulatory Visit (HOSPITAL_COMMUNITY): Payer: Managed Care, Other (non HMO) | Admitting: Physician Assistant

## 2022-02-12 ENCOUNTER — Encounter (HOSPITAL_COMMUNITY): Payer: Managed Care, Other (non HMO)

## 2022-02-12 ENCOUNTER — Inpatient Hospital Stay (HOSPITAL_COMMUNITY): Payer: Managed Care, Other (non HMO) | Attending: Physician Assistant

## 2022-02-12 ENCOUNTER — Other Ambulatory Visit (HOSPITAL_COMMUNITY): Payer: Managed Care, Other (non HMO)

## 2022-02-12 ENCOUNTER — Inpatient Hospital Stay (HOSPITAL_COMMUNITY): Payer: Managed Care, Other (non HMO)

## 2022-02-12 LAB — IRON AND TIBC
Iron: 175 ug/dL (ref 45–182)
Saturation Ratios: 71 % — ABNORMAL HIGH (ref 17.9–39.5)
TIBC: 245 ug/dL — ABNORMAL LOW (ref 250–450)
UIBC: 70 ug/dL

## 2022-02-12 LAB — CBC WITH DIFFERENTIAL/PLATELET
Abs Immature Granulocytes: 0.03 10*3/uL (ref 0.00–0.07)
Basophils Absolute: 0.1 10*3/uL (ref 0.0–0.1)
Basophils Relative: 1 %
Eosinophils Absolute: 0.1 10*3/uL (ref 0.0–0.5)
Eosinophils Relative: 1 %
HCT: 46.2 % (ref 39.0–52.0)
Hemoglobin: 15.6 g/dL (ref 13.0–17.0)
Immature Granulocytes: 0 %
Lymphocytes Relative: 26 %
Lymphs Abs: 2.2 10*3/uL (ref 0.7–4.0)
MCH: 31.4 pg (ref 26.0–34.0)
MCHC: 33.8 g/dL (ref 30.0–36.0)
MCV: 93 fL (ref 80.0–100.0)
Monocytes Absolute: 0.5 10*3/uL (ref 0.1–1.0)
Monocytes Relative: 6 %
Neutro Abs: 5.5 10*3/uL (ref 1.7–7.7)
Neutrophils Relative %: 66 %
Platelets: 303 10*3/uL (ref 150–400)
RBC: 4.97 MIL/uL (ref 4.22–5.81)
RDW: 11.5 % (ref 11.5–15.5)
WBC: 8.4 10*3/uL (ref 4.0–10.5)
nRBC: 0 % (ref 0.0–0.2)

## 2022-02-12 LAB — FERRITIN: Ferritin: 442 ng/mL — ABNORMAL HIGH (ref 24–336)

## 2022-02-12 NOTE — Patient Instructions (Signed)
[facility]  Discharge Instructions: Thank you for choosing [facility] to provide your oncology and hematology care.  If you have a lab appointment with the Cancer Center, please come in thru the Main Entrance and check in at the main information desk.  Wear comfortable clothing and clothing appropriate for easy access to any Portacath or PICC line.   We strive to give you quality time with your provider. You may need to reschedule your appointment if you arrive late (15 or more minutes).  Arriving late affects you and other patients whose appointments are after yours.  Also, if you miss three or more appointments without notifying the office, you may be dismissed from the clinic at the provider's discretion.      For prescription refill requests, have your pharmacy contact our office and allow 72 hours for refills to be completed.        To help prevent nausea and vomiting after your treatment, we encourage you to take your nausea medication as directed.  BELOW ARE SYMPTOMS THAT SHOULD BE REPORTED IMMEDIATELY: *FEVER GREATER THAN 100.4 F (38 C) OR HIGHER *CHILLS OR SWEATING *NAUSEA AND VOMITING THAT IS NOT CONTROLLED WITH YOUR NAUSEA MEDICATION *UNUSUAL SHORTNESS OF BREATH *UNUSUAL BRUISING OR BLEEDING *URINARY PROBLEMS (pain or burning when urinating, or frequent urination) *BOWEL PROBLEMS (unusual diarrhea, constipation, pain near the anus) TENDERNESS IN MOUTH AND THROAT WITH OR WITHOUT PRESENCE OF ULCERS (sore throat, sores in mouth, or a toothache) UNUSUAL RASH, SWELLING OR PAIN  UNUSUAL VAGINAL DISCHARGE OR ITCHING   Items with * indicate a potential emergency and should be followed up as soon as possible or go to the Emergency Department if any problems should occur.  Please show the CHEMOTHERAPY ALERT CARD or IMMUNOTHERAPY ALERT CARD at check-in to the Emergency Department and triage nurse.  Should you have questions after your visit or need to cancel  or reschedule your appointment, please contact [facility] [phone]  and follow the prompts.  Office hours are 8:00 a.m. to 4:30 p.m. Monday - Friday. Please note that voicemails left after 4:00 p.m. may not be returned until the following business day.  We are closed weekends and major holidays. You have access to a nurse at all times for urgent questions. Please call the main number to the clinic [phone] and follow the prompts.  For any non-urgent questions, you may also contact your provider using MyChart. We now offer e-Visits for anyone 18 and older to request care online for non-urgent symptoms. For details visit [email or web address].   Also download the MyChart app! Go to the app store, search "MyChart", open the app, select [facility], and log in with your MyChart username and password.  Due to Covid, a mask is required upon entering the hospital/clinic. If you do not have a mask, one will be given to you upon arrival. For doctor visits, patients may have 1 support person aged 18 or older with them. For treatment visits, patients cannot have anyone with them due to current Covid guidelines and our immunocompromised population.  

## 2022-02-12 NOTE — Progress Notes (Signed)
Patient presents today for therapeutic phlebotomy.  Patient is in satisfactory condition with no complaints voiced.  Vital signs are stable.   Hemoglobin today is 15.6.  We will proceed with phlebotomy per MD orders. ? ?Start time for therapeutic phlebotomy is 1131 and end time was 1136.  250 mL of blood removed. Vital signs remained stable.  ? ?Patient tolerated phlebotomy well with no complaints voiced.  Patient left ambulatory in stable condition.  Vital signs stable at discharge.  Follow up as scheduled.    ? ?

## 2022-02-18 NOTE — Progress Notes (Signed)
[facility] [address], [place] [address]   CLINIC:  Medical Oncology/Hematology  PCP:  Gwenlyn Fudge, FNP [address] [place] [address] [phone]   REASON FOR VISIT:  Follow-up for hereditary hemochromatosis   PRIOR THERAPY: Weekly phlebotomy (500 mL each)   CURRENT THERAPY: Weekly phlebotomy (250 mL each)  INTERVAL HISTORY:  [name] 61 y.o. male returns for routine follow-up of hereditary hemochromatosis.  He was last seen by Rojelio Brenner PA-C on 11/06/2021.  At today's visit, he reports feeling well. He is tolerating his weekly phlebotomy of 250 mL well.   He reports that he is continuing to feel better.  He reports decreased joint pain, improved energy, and improved appetite.  He continues to have some abdominal pain after eating.  He has decreased bronzing of his skin.  He has no acute complaints at today's visit. He has 75% energy and 100% appetite. He endorses that he is maintaining a stable weight.   REVIEW OF SYSTEMS:  Review of Systems  Constitutional:  Negative for appetite change, chills, diaphoresis, fatigue, fever and unexpected weight change.  HENT:   Negative for lump/mass and nosebleeds.   Eyes:  Negative for eye problems.  Respiratory:  Negative for cough, hemoptysis and shortness of breath.   Cardiovascular:  Negative for chest pain, leg swelling and palpitations.  Gastrointestinal:  Negative for abdominal pain, blood in stool, constipation, diarrhea, nausea and vomiting.  Genitourinary:  Negative for hematuria.   Skin: Negative.   Neurological:  Negative for dizziness, headaches and light-headedness.  Hematological:  Does not bruise/bleed easily.     PAST MEDICAL/SURGICAL HISTORY:  Past Medical History:  Diagnosis Date   Chronic pain    right forearm.   Hereditary hemochromatosis (HCC) 08/07/2021   Irritable bowel syndrome (IBS) 2006, 2007   "nervous stomach" abdominal pain   Small bowel obstruction  due to adhesions (HCC) 09/02/2013   Past Surgical History:  Procedure Laterality Date   COLONOSCOPY  2006   Carman Ching MD   COLONOSCOPY WITH PROPOFOL N/A 08/11/2021   Procedure: COLONOSCOPY WITH PROPOFOL;  Surgeon: Lanelle Bal, DO;  Location[facility];  Service: Endoscopy;  Laterality: N/A;  9:00 / ASA II   ESOPHAGOGASTRODUODENOSCOPY  2006   Raymondo Band MD   LAPAROSCOPIC LYSIS OF ADHESIONS N/A 09/02/2013   Procedure: LAPAROSCOPIC LYSIS OF ADHESIONS ;  Surgeon: [name], MD;  Location: [facility] OR;  Service: General;  Laterality: N/A;   SHOULDER ARTHROSCOPY Right 04/2014   Dr. Ranell Patrick   surgery for right forarm trauma     work related machine injury     SOCIAL HISTORY:  Social History   Socioeconomic History   Marital status: Married    Spouse name: Not on file   Number of children: Not on file   Years of education: Not on file   Highest education level: Not on file  Occupational History   Not on file  Tobacco Use   Smoking status: Every Day    Packs/day: 1.00    Years: 38.00    Pack years: 38.00    Types: Cigarettes   Smokeless tobacco: Never  Vaping Use   Vaping Use: Never used  Substance and Sexual Activity   Alcohol use: No   Drug use: No   Sexual activity: Not on file  Other Topics Concern   Not on file  Social History Narrative   Not on file   Social Determinants of Health  Financial Resource Strain: Not on file  Food Insecurity: Not on file  Transportation Needs: Not on file  Physical Activity: Not on file  Stress: Not on file  Social Connections: Not on file  Intimate Partner Violence: Not on file    FAMILY HISTORY:  Family History  Problem Relation Age of Onset   Ovarian cancer Sister     CURRENT MEDICATIONS:  Outpatient Encounter Medications as of 02/19/2022  Medication Sig   Aspirin-Acetaminophen-Caffeine (GOODY HEADACHE PO) Take 1 packet by mouth daily as needed (pain).   CLENPIQ 10-3.5-12 MG-GM -GM/160ML SOLN  SMARTSIG:320 Milliliter(s) By Mouth As Directed   meloxicam (MOBIC) 7.5 MG tablet Take 1 tablet (7.5 mg total) by mouth daily.   rosuvastatin (CRESTOR) 5 MG tablet Take 1 tablet (5 mg total) by mouth daily.   No facility-administered encounter medications on file as of 02/19/2022.    ALLERGIES:  No Known Allergies   PHYSICAL EXAM:  ECOG PERFORMANCE STATUS: 0 - Asymptomatic  There were no vitals filed for this visit. There were no vitals filed for this visit. Physical Exam Constitutional:      Appearance: Normal appearance.  HENT:     Head: Normocephalic and atraumatic.     Mouth/Throat:     Mouth: Mucous membranes are moist.  Eyes:     Extraocular Movements: Extraocular movements intact.     Pupils: Pupils are equal, round, and reactive to light.  Cardiovascular:     Rate and Rhythm: Normal rate and regular rhythm.     Pulses: Normal pulses.     Heart sounds: Normal heart sounds.  Pulmonary:     Effort: Pulmonary effort is normal.     Breath sounds: Normal breath sounds.  Abdominal:     General: Bowel sounds are normal.     Palpations: Abdomen is soft.     Tenderness: There is no abdominal tenderness.  Musculoskeletal:        General: No swelling.     Right lower leg: No edema.     Left lower leg: No edema.  Lymphadenopathy:     Cervical: No cervical adenopathy.  Skin:    General: Skin is warm and dry.     Comments: Bronzed color to skin, decreased from previous  Neurological:     General: No focal deficit present.     Mental Status: He is alert and oriented to person, place, and time.  Psychiatric:        Mood and Affect: Mood normal.        Behavior: Behavior normal.     LABORATORY DATA:  I have reviewed the labs as listed.  CBC    Component Value Date/Time   WBC 8.4 02/12/2022 1035   RBC 4.97 02/12/2022 1035   HGB 15.6 02/12/2022 1035   HGB 17.3 05/13/2021 0954   HCT 46.2 02/12/2022 1035   HCT 50.8 05/13/2021 0954   PLT 303 02/12/2022 1035   PLT  325 05/13/2021 0954   MCV 93.0 02/12/2022 1035   MCV 93 05/13/2021 0954   MCH 31.4 02/12/2022 1035   MCHC 33.8 02/12/2022 1035   RDW 11.5 02/12/2022 1035   RDW 12.1 05/13/2021 0954   LYMPHSABS 2.2 02/12/2022 1035   LYMPHSABS 2.0 05/13/2021 0954   MONOABS 0.5 02/12/2022 1035   EOSABS 0.1 02/12/2022 1035   EOSABS 0.2 05/13/2021 0954   BASOSABS 0.1 02/12/2022 1035   BASOSABS 0.1 05/13/2021 0954      Latest Ref Rng & Units 07/23/2021   10:11  AM 05/13/2021    9:54 AM 02/04/2017    4:22 PM  CMP  Glucose 65 - 99 mg/dL  90   95    BUN 6 - 24 mg/dL  18   10    Creatinine 0.76 - 1.27 mg/dL  1.611.02   0.960.85    Sodium 134 - 144 mmol/L  138   141    Potassium 3.5 - 5.2 mmol/L  5.2   4.5    Chloride 96 - 106 mmol/L  101   106    CO2 20 - 29 mmol/L  24   23    Calcium 8.7 - 10.2 mg/dL  04.510.2   9.4    Total Protein 6.5 - 8.1 g/dL 7.3   7.0   6.4    Total Bilirubin 0.3 - 1.2 mg/dL 0.9   0.7   0.3    Alkaline Phos 38 - 126 U/L 75   105   80    AST 15 - 41 U/L 26   28   28     ALT 0 - 44 U/L 32   24   37      DIAGNOSTIC IMAGING:  I have independently reviewed the relevant imaging and discussed with the patient.  ASSESSMENT & PLAN: 1.  Hereditary hemochromatosis (C282Y homozygous) - Severe hepatic iron deposition was incidental finding on recent MRI abdomen (06/11/2021), which was obtained by PCP for monitoring of previously noted hepatic cyst. - MRI abdomen (06/11/2021) showed severe diffuse hepatic iron deposition, as well as tiny < 5 mm cyst which correlates with lesion seen on prior CT; no evidence of hepatic neoplasm. - Hemochromatosis DNA testing shows homozygosity for C282Y HFE gene (07/23/2021).  No abnormalities on hepatic function panel.  Viral hepatitis panel was negative. - Cardiac MRI (09/16/2021) was negative for cardiac hemochromatosis. - At presentation, he had symptomatic iron overload with fatigue, joint pain, and abdominal pain after eating    - Discussed  course of untreated  hereditary hemochromatosis with risk of cirrhosis, hepatocellular carcinoma, heart failure, and arrhythmia.  Iron overload also increases risk of certain bacterial infections. - We have discussed lifestyle modifications such as avoiding alcohol and iron supplementation.  Recent guidelines do not find that moderate red meat intake adversely affects patients with hemochromatosis.  Patient should avoid raw fish or undercooked meat due to risk of certain bacterial infections to thrive and iron rich environments. - We have discussed phlebotomy protocol for treatment of hemochromatosis: Weekly phlebotomy as tolerated until goal ferritin is reached. If Hgb drops < 11.0, we will decrease frequency of phlebotomy. If patient experiences severe side effects or hemodynamic instability, we will decrease amount taken for phlebotomy. Goal is ferritin < 50 for normalization of iron stores. Once iron stores have normalized, we will decrease frequency of phlebotomy. - Initially started on weekly 500 mL phlebotomy, decreased to weekly 250 mL phlebotomy due to significant fatigue after full phlebotomy. - Symptoms today are improved, as he reports decreased fatigue, decreased joint pain, and decreased skin bronzing.  Appetite is improved. - Most recent labs (02/12/2022): Ferritin 442, iron saturation 71%, Hgb 15.6 - PLAN: Weekly phlebotomy (250 mL each) as above.  Monthly CBC.   - Recommend genetic testing of first-degree relatives (parents, siblings, children), as early detection and treatment can help prevent complications. - We will defer GI referral for now, since there were no signs of liver damage apart from iron overload on imaging or labs - Repeat CBC, CMP, and iron panel with  RTC in 3 months.   2.  Tobacco abuse - Patient has been smoking 1.0-1.5 PPD since age 77. - This patient meets criteria for low-dose CT lung cancer screening (age 71-80 with a 20+ pack year history, current everyday smoker /OR/ quit < 15  years ago, no current signs or symptoms of lung cancer) - Shared decision making visit completed on 11/06/2021 - PLAN: LDCT chest scheduled for 03/05/2022.  Patient has been referred to Nurse Navigator to coordinate annual CT scans.   3.  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 - SUBSTANCE: He smokes 1.0-1.5 PPD cigarettes since age 37.  He drinks socially, usually 1 or 2 mixed drinks once or twice a year.  He has a previous history of heavy alcohol use, but quit 26 years ago (age 50).  He denies any illicit drug use. - FAMILY: No family history of liver disease, hemochromatosis, blood disorders, or iron disorders.  Patient's mother had breast cancer.   All questions were answered. The patient knows to call the clinic with any problems, questions or concerns.  Medical decision making: Low  Time spent on visit: I spent 15 minutes counseling the patient face to face. The total time spent in the appointment was 20 minutes and more than 50% was on counseling.   Carnella Guadalajara, PA-C  02/19/2022 1:31 PM

## 2022-02-19 ENCOUNTER — Inpatient Hospital Stay (HOSPITAL_COMMUNITY): Payer: Managed Care, Other (non HMO)

## 2022-02-19 ENCOUNTER — Inpatient Hospital Stay (HOSPITAL_BASED_OUTPATIENT_CLINIC_OR_DEPARTMENT_OTHER): Payer: Managed Care, Other (non HMO) | Admitting: Physician Assistant

## 2022-02-19 LAB — CBC
HCT: 47.2 % (ref 39.0–52.0)
Hemoglobin: 16 g/dL (ref 13.0–17.0)
MCH: 31.7 pg (ref 26.0–34.0)
MCHC: 33.9 g/dL (ref 30.0–36.0)
MCV: 93.5 fL (ref 80.0–100.0)
Platelets: 316 10*3/uL (ref 150–400)
RBC: 5.05 MIL/uL (ref 4.22–5.81)
RDW: 11.3 % — ABNORMAL LOW (ref 11.5–15.5)
WBC: 8.1 10*3/uL (ref 4.0–10.5)
nRBC: 0 % (ref 0.0–0.2)

## 2022-02-19 NOTE — Patient Instructions (Addendum)
[facility] at [facility] Discharge Instructions  You were seen today by Rojelio Brenner PA-C for your hemochromatosis (iron overload).  Your numbers are looking better!  To treat your hemochromatosis, we will continue to take blood from you once per week, but we will continue to monitor your blood counts to make sure that we do not take too much.  LABS: Return in 1 month for repeat labs and blood count  TREATMENT: Weekly phlebotomy (taking blood)  FOLLOW-UP APPOINTMENT: Office visit in 4 months   Thank you for choosing [facility] at [facility] to provide your oncology and hematology care.  To afford each patient quality time with our provider, please arrive at least 15 minutes before your scheduled appointment time.   If you have a lab appointment with the Cancer Center please come in thru the Main Entrance and check in at the main information desk.  You need to re-schedule your appointment should you arrive 10 or more minutes late.  We strive to give you quality time with our providers, and arriving late affects you and other patients whose appointments are after yours.  Also, if you no show three or more times for appointments you may be dismissed from the clinic at the providers discretion.     Again, thank you for choosing [facility].  Our hope is that these requests will decrease the amount of time that you wait before being seen by our physicians.       _____________________________________________________________  Should you have questions after your visit to [facility], please contact our office at [phone] and follow the prompts.  Our office hours are 8:00 a.m. and 4:30 p.m. Monday - Friday.  Please note that voicemails left after 4:00 p.m. may not be returned until the following business day.  We are closed weekends and major holidays.  You do have access to a nurse 24-7, just call the main number to  the clinic [phone] and do not press any options, hold on the line and a nurse will answer the phone.    For prescription refill requests, have your pharmacy contact our office and allow 72 hours.    Due to Covid, you will need to wear a mask upon entering the hospital. If you do not have a mask, a mask will be given to you at the Main Entrance upon arrival. For doctor visits, patients may have 1 support person age 67 or older with them. For treatment visits, patients can not have anyone with them due to social distancing guidelines and our immunocompromised population.

## 2022-02-19 NOTE — Progress Notes (Signed)
Garrett Lin presents today for phlebotomy per MD orders. Phlebotomy procedure started at 1049 and ended at 1052. 300 grams removed. Patient observed for 5 minutes after procedure without any incident. Patient tolerated procedure well. IV needle removed intact.

## 2022-02-26 ENCOUNTER — Encounter (HOSPITAL_COMMUNITY): Payer: Self-pay

## 2022-02-26 ENCOUNTER — Inpatient Hospital Stay (HOSPITAL_COMMUNITY): Payer: Managed Care, Other (non HMO)

## 2022-02-26 NOTE — Progress Notes (Signed)
Eric Form presents today for phlebotomy per MD orders. Phlebotomy procedure started at 1133 and ended at 1137. 250 cc removed. Patient tolerated procedure well. IV needle removed intact.  Patient tolerated phlebotomy with no complaints voiced.  Peripheral IV site intact with no bruising or swelling noted.  Denied SOB, chest pain, or dizziness.  Gauze with coban applied.  Discharged with VSS and no s/s of distress noted.

## 2022-02-26 NOTE — Patient Instructions (Signed)
[facility]  Discharge Instructions: Thank you for choosing [facility] to provide your oncology and hematology care.  If you have a lab appointment with the Cancer Center, please come in thru the Main Entrance and check in at the main information desk.  Wear comfortable clothing and clothing appropriate for easy access to any Portacath or PICC line.   We strive to give you quality time with your provider. You may need to reschedule your appointment if you arrive late (15 or more minutes).  Arriving late affects you and other patients whose appointments are after yours.  Also, if you miss three or more appointments without notifying the office, you may be dismissed from the clinic at the provider's discretion.      For prescription refill requests, have your pharmacy contact our office and allow 72 hours for refills to be completed.        To help prevent nausea and vomiting after your treatment, we encourage you to take your nausea medication as directed.  BELOW ARE SYMPTOMS THAT SHOULD BE REPORTED IMMEDIATELY: *FEVER GREATER THAN 100.4 F (38 C) OR HIGHER *CHILLS OR SWEATING *NAUSEA AND VOMITING THAT IS NOT CONTROLLED WITH YOUR NAUSEA MEDICATION *UNUSUAL SHORTNESS OF BREATH *UNUSUAL BRUISING OR BLEEDING *URINARY PROBLEMS (pain or burning when urinating, or frequent urination) *BOWEL PROBLEMS (unusual diarrhea, constipation, pain near the anus) TENDERNESS IN MOUTH AND THROAT WITH OR WITHOUT PRESENCE OF ULCERS (sore throat, sores in mouth, or a toothache) UNUSUAL RASH, SWELLING OR PAIN  UNUSUAL VAGINAL DISCHARGE OR ITCHING   Items with * indicate a potential emergency and should be followed up as soon as possible or go to the Emergency Department if any problems should occur.  Please show the CHEMOTHERAPY ALERT CARD or IMMUNOTHERAPY ALERT CARD at check-in to the Emergency Department and triage nurse.  Should you have questions after your visit or need to cancel  or reschedule your appointment, please contact [facility] [phone]  and follow the prompts.  Office hours are 8:00 a.m. to 4:30 p.m. Monday - Friday. Please note that voicemails left after 4:00 p.m. may not be returned until the following business day.  We are closed weekends and major holidays. You have access to a nurse at all times for urgent questions. Please call the main number to the clinic [phone] and follow the prompts.  For any non-urgent questions, you may also contact your provider using MyChart. We now offer e-Visits for anyone 18 and older to request care online for non-urgent symptoms. For details visit [email or web address].   Also download the MyChart app! Go to the app store, search "MyChart", open the app, select [facility], and log in with your MyChart username and password.  Due to Covid, a mask is required upon entering the hospital/clinic. If you do not have a mask, one will be given to you upon arrival. For doctor visits, patients may have 1 support person aged 18 or older with them. For treatment visits, patients cannot have anyone with them due to current Covid guidelines and our immunocompromised population.  

## 2022-03-05 ENCOUNTER — Inpatient Hospital Stay (HOSPITAL_COMMUNITY): Payer: Managed Care, Other (non HMO) | Attending: Physician Assistant

## 2022-03-05 ENCOUNTER — Encounter (HOSPITAL_COMMUNITY): Payer: Self-pay

## 2022-03-05 ENCOUNTER — Ambulatory Visit (HOSPITAL_COMMUNITY)
Admission: RE | Admit: 2022-03-05 | Discharge: 2022-03-05 | Disposition: A | Discharge status: Home / Self Care | Payer: Managed Care, Other (non HMO) | Source: Ambulatory Visit | Attending: Physician Assistant | Admitting: Physician Assistant

## 2022-03-05 DIAGNOSIS — Z87891 Personal history of nicotine dependence: Secondary | ICD-10-CM | POA: Insufficient documentation

## 2022-03-05 NOTE — Patient Instructions (Signed)
[facility]  Discharge Instructions: Thank you for choosing [facility] to provide your oncology and hematology care.  If you have a lab appointment with the Cancer Center, please come in thru the Main Entrance and check in at the main information desk.  Wear comfortable clothing and clothing appropriate for easy access to any Portacath or PICC line.   We strive to give you quality time with your provider. You may need to reschedule your appointment if you arrive late (15 or more minutes).  Arriving late affects you and other patients whose appointments are after yours.  Also, if you miss three or more appointments without notifying the office, you may be dismissed from the clinic at the provider's discretion.      For prescription refill requests, have your pharmacy contact our office and allow 72 hours for refills to be completed.    Today you received the following:Phlebotomy.      To help prevent nausea and vomiting after your treatment, we encourage you to take your nausea medication as directed.  BELOW ARE SYMPTOMS THAT SHOULD BE REPORTED IMMEDIATELY: *FEVER GREATER THAN 100.4 F (38 C) OR HIGHER *CHILLS OR SWEATING *NAUSEA AND VOMITING THAT IS NOT CONTROLLED WITH YOUR NAUSEA MEDICATION *UNUSUAL SHORTNESS OF BREATH *UNUSUAL BRUISING OR BLEEDING *URINARY PROBLEMS (pain or burning when urinating, or frequent urination) *BOWEL PROBLEMS (unusual diarrhea, constipation, pain near the anus) TENDERNESS IN MOUTH AND THROAT WITH OR WITHOUT PRESENCE OF ULCERS (sore throat, sores in mouth, or a toothache) UNUSUAL RASH, SWELLING OR PAIN  UNUSUAL VAGINAL DISCHARGE OR ITCHING   Items with * indicate a potential emergency and should be followed up as soon as possible or go to the Emergency Department if any problems should occur.  Please show the CHEMOTHERAPY ALERT CARD or IMMUNOTHERAPY ALERT CARD at check-in to the Emergency Department and triage nurse.  Should you  have questions after your visit or need to cancel or reschedule your appointment, please contact [facility] [phone]  and follow the prompts.  Office hours are 8:00 a.m. to 4:30 p.m. Monday - Friday. Please note that voicemails left after 4:00 p.m. may not be returned until the following business day.  We are closed weekends and major holidays. You have access to a nurse at all times for urgent questions. Please call the main number to the clinic [phone] and follow the prompts.  For any non-urgent questions, you may also contact your provider using MyChart. We now offer e-Visits for anyone 18 and older to request care online for non-urgent symptoms. For details visit [email or web address].   Also download the MyChart app! Go to the app store, search "MyChart", open the app, select [facility], and log in with your MyChart username and password.  Due to Covid, a mask is required upon entering the hospital/clinic. If you do not have a mask, one will be given to you upon arrival. For doctor visits, patients may have 1 support person aged 18 or older with them. For treatment visits, patients cannot have anyone with them due to current Covid guidelines and our immunocompromised population.  

## 2022-03-05 NOTE — Progress Notes (Signed)
Garrett Lin presents for therapeutic phlebotomy per MD orders. Last HGB 16.0 / HCT 16.0 on 47.2 on 02/19/2022 . Vital signs stable prior to procedure. Procedure started at 11:34 am  and ended at 11:42 am. 250 mls of blood removed. Patient denies any dizziness , lightheadedness, or feeling faint.  Gauze and coban applied to site. Vital signs stable at completion of procedure. Patient has no complaints at this time. Alert and oriented x 3. Discharged in stable condition.

## 2022-03-12 ENCOUNTER — Inpatient Hospital Stay (HOSPITAL_COMMUNITY): Payer: Managed Care, Other (non HMO)

## 2022-03-12 ENCOUNTER — Encounter (HOSPITAL_COMMUNITY): Payer: Self-pay

## 2022-03-12 NOTE — Patient Instructions (Signed)
[facility]  Discharge Instructions: Thank you for choosing [facility] to provide your oncology and hematology care.  If you have a lab appointment with the [facility], please come in thru the Main Entrance and check in at the main information desk.  Wear comfortable clothing and clothing appropriate for easy access to any Portacath or PICC line.   We strive to give you quality time with your provider. You may need to reschedule your appointment if you arrive late (15 or more minutes).  Arriving late affects you and other patients whose appointments are after yours.  Also, if you miss three or more appointments without notifying the office, you may be dismissed from the clinic at the provider's discretion.      For prescription refill requests, have your pharmacy contact our office and allow 72 hours for refills to be completed.   Therapeutic Phlebotomy, Care After The following information offers guidance on how to care for yourself after your procedure. Your health care provider may also give you more specific instructions. If you have problems or questions, contact your health care provider. What can I expect after the procedure? After therapeutic phlebotomy, it is common to have: Light-headedness or dizziness. You may feel faint. Nausea. Tiredness (fatigue). Follow these instructions at home: Eating and drinking Be sure to eat well-balanced meals for the next 24 hours. Drink enough fluid to keep your urine pale yellow. Avoid drinking alcohol on the day that you had the procedure. Activity  Return to your normal activities as told by your health care provider. Most people can go back to their normal activities right away. Avoid activities that take a lot of effort for about 5 hours after the procedure. Athletes should avoid strenuous exercise for at least 12 hours. Avoid heavy lifting or pulling for about 5 hours after the procedure. Do not lift anything  that is heavier than 10 lb (4.5 kg). Change positions slowly for the remainder of the day, like from sitting to standing. This can help prevent light-headedness or fainting. If you feel light-headed, lie down until the feeling goes away. Needle insertion site care  Keep your bandage (dressing) dry. You can remove the bandage after about 5 hours or as told by your health care provider. If you have bleeding from the needle insertion site, raise (elevate) your arm and press firmly on the site until the bleeding stops. If you have bruising at the site, apply ice to the area. To do this: Put ice in a plastic bag. Place a towel between your skin and the bag. Leave the ice on for 20 minutes, 2-3 times a day for the first 24 hours. Remove the ice if your skin turns bright red so you do not damage the area. If the swelling does not go away after 24 hours, apply a warm, moist cloth (warm compress) to the area for 20 minutes, 2-3 times a day. General instructions Do not use any products that contain nicotine or tobacco, like cigarettes, chewing tobacco, and vaping devices, such as e-cigarettes, for at least 30 minutes after the procedure. If you need help quitting, ask your health care provider. Keep all follow-up visits. You may need to continue having regular blood tests and therapeutic phlebotomy treatments as directed. Contact a health care provider if: You have redness, swelling, or pain at the needle insertion site. Fluid or blood is coming from the needle insertion site. Pus or a bad smell is coming from the needle  insertion site. The needle insertion site feels warm to the touch. You feel light-headed, dizzy, or nauseous, and the feeling does not go away. You have new bruising at the needle insertion site. You feel weaker than normal. You have a fever or chills. Get help right away if: You have chest pain. You have trouble breathing. You have severe nausea or vomiting. Summary After the  procedure, it is common to have some light-headedness, dizziness, nausea, or tiredness (fatigue). Be sure to eat well-balanced meals for the next 24 hours. Drink enough fluid to keep your urine pale yellow. Return to your normal activities as told by your health care provider. Keep all follow-up visits. You may need to continue having regular blood tests and therapeutic phlebotomy treatments as directed. This information is not intended to replace advice given to you by your health care provider. Make sure you discuss any questions you have with your health care provider. Document Revised: 03/18/2021 Document Reviewed: 03/18/2021 Elsevier Patient Education  [place].     To help prevent nausea and vomiting after your treatment, we encourage you to take your nausea medication as directed.  BELOW ARE SYMPTOMS THAT SHOULD BE REPORTED IMMEDIATELY: *FEVER GREATER THAN 100.4 F (38 C) OR HIGHER *CHILLS OR SWEATING *NAUSEA AND VOMITING THAT IS NOT CONTROLLED WITH YOUR NAUSEA MEDICATION *UNUSUAL SHORTNESS OF BREATH *UNUSUAL BRUISING OR BLEEDING *URINARY PROBLEMS (pain or burning when urinating, or frequent urination) *BOWEL PROBLEMS (unusual diarrhea, constipation, pain near the anus) TENDERNESS IN MOUTH AND THROAT WITH OR WITHOUT PRESENCE OF ULCERS (sore throat, sores in mouth, or a toothache) UNUSUAL RASH, SWELLING OR PAIN  UNUSUAL VAGINAL DISCHARGE OR ITCHING   Items with * indicate a potential emergency and should be followed up as soon as possible or go to the Emergency Department if any problems should occur.  Please show the CHEMOTHERAPY ALERT CARD or IMMUNOTHERAPY ALERT CARD at check-in to the Emergency Department and triage nurse.  Should you have questions after your visit or need to cancel or reschedule your appointment, please contact [facility] [phone]  and follow the prompts.  Office hours are 8:00 a.m. to 4:30 p.m. Monday - Friday. Please note that  voicemails left after 4:00 p.m. may not be returned until the following business day.  We are closed weekends and major holidays. You have access to a nurse at all times for urgent questions. Please call the main number to the clinic [phone] and follow the prompts.  For any non-urgent questions, you may also contact your provider using MyChart. We now offer e-Visits for anyone 78 and older to request care online for non-urgent symptoms. For details visit [email or web address].   Also download the MyChart app! Go to the app store, search "MyChart", open the app, select [facility], and log in with your MyChart username and password.  Due to Covid, a mask is required upon entering the hospital/clinic. If you do not have a mask, one will be given to you upon arrival. For doctor visits, patients may have 1 support person aged 81 or older with them. For treatment visits, patients cannot have anyone with them due to current Covid guidelines and our immunocompromised population.

## 2022-03-12 NOTE — Progress Notes (Signed)
Garrett Lin presents today for phlebotomy per MD orders. Phlebotomy procedure started at 1120 and ended at 1125. 250 cc removed. Patient tolerated procedure well. IV needle removed intact.   Patient tolerated phlebotomy with no complaints voiced.  Peripheral IV site intact with no bruising or swelling noted.  Denied SOB, chest pain, or dizziness.  Gauze with coban applied.  Discharged with VSS and no s/s of distress noted.

## 2022-03-19 ENCOUNTER — Inpatient Hospital Stay (HOSPITAL_COMMUNITY): Payer: Managed Care, Other (non HMO)

## 2022-03-19 LAB — CBC
HCT: 42.9 % (ref 39.0–52.0)
Hemoglobin: 14.9 g/dL (ref 13.0–17.0)
MCH: 32.5 pg (ref 26.0–34.0)
MCHC: 34.7 g/dL (ref 30.0–36.0)
MCV: 93.5 fL (ref 80.0–100.0)
Platelets: 289 10*3/uL (ref 150–400)
RBC: 4.59 MIL/uL (ref 4.22–5.81)
RDW: 11.4 % — ABNORMAL LOW (ref 11.5–15.5)
WBC: 8.4 10*3/uL (ref 4.0–10.5)
nRBC: 0 % (ref 0.0–0.2)

## 2022-03-19 NOTE — Progress Notes (Signed)
Patient presents today for therapeutic phlebotomy.  Patient is in satisfactory condition with no complaints voiced.  Vital signs are stable.  Labs reviewed. Hemoglobin today is 14.9 and hematocrit is 42.9.  We will proceed with phlebotomy per provider orders.   Patient tolerated therapeutic phlebotomy well with no complaints voiced.  Phlebotomy started at 1116 and ended at 1123.  250 mL of blood drawn off of patient.  Patient refused to wait the recommended 30 minute post wait time.  Patient left ambulatory in stable condition.  Vital signs stable at discharge.  Follow up as scheduled.

## 2022-03-19 NOTE — Patient Instructions (Signed)
[facility]  Discharge Instructions: Thank you for choosing [facility] to provide your oncology and hematology care.  If you have a lab appointment with the Cancer Center, please come in thru the Main Entrance and check in at the main information desk.  Wear comfortable clothing and clothing appropriate for easy access to any Portacath or PICC line.   We strive to give you quality time with your provider. You may need to reschedule your appointment if you arrive late (15 or more minutes).  Arriving late affects you and other patients whose appointments are after yours.  Also, if you miss three or more appointments without notifying the office, you may be dismissed from the clinic at the provider's discretion.      For prescription refill requests, have your pharmacy contact our office and allow 72 hours for refills to be completed.         To help prevent nausea and vomiting after your treatment, we encourage you to take your nausea medication as directed.  BELOW ARE SYMPTOMS THAT SHOULD BE REPORTED IMMEDIATELY: *FEVER GREATER THAN 100.4 F (38 C) OR HIGHER *CHILLS OR SWEATING *NAUSEA AND VOMITING THAT IS NOT CONTROLLED WITH YOUR NAUSEA MEDICATION *UNUSUAL SHORTNESS OF BREATH *UNUSUAL BRUISING OR BLEEDING *URINARY PROBLEMS (pain or burning when urinating, or frequent urination) *BOWEL PROBLEMS (unusual diarrhea, constipation, pain near the anus) TENDERNESS IN MOUTH AND THROAT WITH OR WITHOUT PRESENCE OF ULCERS (sore throat, sores in mouth, or a toothache) UNUSUAL RASH, SWELLING OR PAIN  UNUSUAL VAGINAL DISCHARGE OR ITCHING   Items with * indicate a potential emergency and should be followed up as soon as possible or go to the Emergency Department if any problems should occur.  Please show the CHEMOTHERAPY ALERT CARD or IMMUNOTHERAPY ALERT CARD at check-in to the Emergency Department and triage nurse.  Should you have questions after your visit or need to  cancel or reschedule your appointment, please contact [facility] [phone]  and follow the prompts.  Office hours are 8:00 a.m. to 4:30 p.m. Monday - Friday. Please note that voicemails left after 4:00 p.m. may not be returned until the following business day.  We are closed weekends and major holidays. You have access to a nurse at all times for urgent questions. Please call the main number to the clinic [phone] and follow the prompts.  For any non-urgent questions, you may also contact your provider using MyChart. We now offer e-Visits for anyone 18 and older to request care online for non-urgent symptoms. For details visit [email or web address].   Also download the MyChart app! Go to the app store, search "MyChart", open the app, select [facility], and log in with your MyChart username and password.  Masks are optional in the cancer centers. If you would like for your care team to wear a mask while they are taking care of you, please let them know. For doctor visits, patients may have with them one support person who is at least 59 years old. At this time, visitors are not allowed in the infusion area.  

## 2022-03-26 ENCOUNTER — Inpatient Hospital Stay (HOSPITAL_COMMUNITY): Payer: Managed Care, Other (non HMO)

## 2022-03-26 ENCOUNTER — Encounter (HOSPITAL_COMMUNITY): Payer: Self-pay

## 2022-04-02 ENCOUNTER — Encounter (HOSPITAL_COMMUNITY): Payer: Self-pay

## 2022-04-02 ENCOUNTER — Inpatient Hospital Stay (HOSPITAL_COMMUNITY): Payer: Managed Care, Other (non HMO)

## 2022-04-02 NOTE — Patient Instructions (Signed)
[facility]  Discharge Instructions: Thank you for choosing [facility] to provide your oncology and hematology care.  If you have a lab appointment with the Cancer Center, please come in thru the Main Entrance and check in at the main information desk.  Wear comfortable clothing and clothing appropriate for easy access to any Portacath or PICC line.   We strive to give you quality time with your provider. You may need to reschedule your appointment if you arrive late (15 or more minutes).  Arriving late affects you and other patients whose appointments are after yours.  Also, if you miss three or more appointments without notifying the office, you may be dismissed from the clinic at the provider's discretion.      For prescription refill requests, have your pharmacy contact our office and allow 72 hours for refills to be completed.     To help prevent nausea and vomiting after your treatment, we encourage you to take your nausea medication as directed.  BELOW ARE SYMPTOMS THAT SHOULD BE REPORTED IMMEDIATELY: *FEVER GREATER THAN 100.4 F (38 C) OR HIGHER *CHILLS OR SWEATING *NAUSEA AND VOMITING THAT IS NOT CONTROLLED WITH YOUR NAUSEA MEDICATION *UNUSUAL SHORTNESS OF BREATH *UNUSUAL BRUISING OR BLEEDING *URINARY PROBLEMS (pain or burning when urinating, or frequent urination) *BOWEL PROBLEMS (unusual diarrhea, constipation, pain near the anus) TENDERNESS IN MOUTH AND THROAT WITH OR WITHOUT PRESENCE OF ULCERS (sore throat, sores in mouth, or a toothache) UNUSUAL RASH, SWELLING OR PAIN  UNUSUAL VAGINAL DISCHARGE OR ITCHING   Items with * indicate a potential emergency and should be followed up as soon as possible or go to the Emergency Department if any problems should occur.  Please show the CHEMOTHERAPY ALERT CARD or IMMUNOTHERAPY ALERT CARD at check-in to the Emergency Department and triage nurse.  Should you have questions after your visit or need to cancel or  reschedule your appointment, please contact [facility] [phone]  and follow the prompts.  Office hours are 8:00 a.m. to 4:30 p.m. Monday - Friday. Please note that voicemails left after 4:00 p.m. may not be returned until the following business day.  We are closed weekends and major holidays. You have access to a nurse at all times for urgent questions. Please call the main number to the clinic [phone] and follow the prompts.  For any non-urgent questions, you may also contact your provider using MyChart. We now offer e-Visits for anyone 50 and older to request care online for non-urgent symptoms. For details visit [email or web address].   Also download the MyChart app! Go to the app store, search "MyChart", open the app, select [facility], and log in with your MyChart username and password.  Masks are optional in the cancer centers. If you would like for your care team to wear a mask while they are taking care of you, please let them know. For doctor visits, patients may have with them one support person who is at least 59 years old. At this time, visitors are not allowed in the infusion area. Therapeutic Phlebotomy, Care After The following information offers guidance on how to care for yourself after your procedure. Your health care provider may also give you more specific instructions. If you have problems or questions, contact your health care provider. What can I expect after the procedure? After therapeutic phlebotomy, it is common to have: Light-headedness or dizziness. You may feel faint. Nausea. Tiredness (fatigue). Follow these instructions at home: Eating and drinking Be  sure to eat well-balanced meals for the next 24 hours. Drink enough fluid to keep your urine pale yellow. Avoid drinking alcohol on the day that you had the procedure. Activity  Return to your normal activities as told by your health care provider. Most people can go back to their normal  activities right away. Avoid activities that take a lot of effort for about 5 hours after the procedure. Athletes should avoid strenuous exercise for at least 12 hours. Avoid heavy lifting or pulling for about 5 hours after the procedure. Do not lift anything that is heavier than 10 lb (4.5 kg). Change positions slowly for the remainder of the day, like from sitting to standing. This can help prevent light-headedness or fainting. If you feel light-headed, lie down until the feeling goes away. Needle insertion site care  Keep your bandage (dressing) dry. You can remove the bandage after about 5 hours or as told by your health care provider. If you have bleeding from the needle insertion site, raise (elevate) your arm and press firmly on the site until the bleeding stops. If you have bruising at the site, apply ice to the area. To do this: Put ice in a plastic bag. Place a towel between your skin and the bag. Leave the ice on for 20 minutes, 2-3 times a day for the first 24 hours. Remove the ice if your skin turns bright red so you do not damage the area. If the swelling does not go away after 24 hours, apply a warm, moist cloth (warm compress) to the area for 20 minutes, 2-3 times a day. General instructions Do not use any products that contain nicotine or tobacco, like cigarettes, chewing tobacco, and vaping devices, such as e-cigarettes, for at least 30 minutes after the procedure. If you need help quitting, ask your health care provider. Keep all follow-up visits. You may need to continue having regular blood tests and therapeutic phlebotomy treatments as directed. Contact a health care provider if: You have redness, swelling, or pain at the needle insertion site. Fluid or blood is coming from the needle insertion site. Pus or a bad smell is coming from the needle insertion site. The needle insertion site feels warm to the touch. You feel light-headed, dizzy, or nauseous, and the feeling  does not go away. You have new bruising at the needle insertion site. You feel weaker than normal. You have a fever or chills. Get help right away if: You have chest pain. You have trouble breathing. You have severe nausea or vomiting. Summary After the procedure, it is common to have some light-headedness, dizziness, nausea, or tiredness (fatigue). Be sure to eat well-balanced meals for the next 24 hours. Drink enough fluid to keep your urine pale yellow. Return to your normal activities as told by your health care provider. Keep all follow-up visits. You may need to continue having regular blood tests and therapeutic phlebotomy treatments as directed. This information is not intended to replace advice given to you by your health care provider. Make sure you discuss any questions you have with your health care provider. Document Revised: 03/18/2021 Document Reviewed: 03/18/2021 Elsevier Patient Education  2023 [employer].

## 2022-04-02 NOTE — Progress Notes (Signed)
Garrett Lin presents today for phlebotomy per MD orders. Phlebotomy procedure started at 1120 and ended at 1122. 250 cc removed. Patient tolerated procedure well. IV needle removed intact.   Patient tolerated phlebotomy with no complaints voiced.  Peripheral IV site intact with no bruising or swelling noted.  Denied SOB, chest pain, or dizziness.  Gauze with coban applied.  Discharged with VSS and no s/s of distress noted.

## 2022-04-09 ENCOUNTER — Inpatient Hospital Stay (HOSPITAL_COMMUNITY): Payer: Managed Care, Other (non HMO) | Attending: Physician Assistant

## 2022-04-09 NOTE — Patient Instructions (Signed)
[facility]  Discharge Instructions: Thank you for choosing [facility] to provide your oncology and hematology care.  If you have a lab appointment with the Cancer Center, please come in thru the Main Entrance and check in at the main information desk.  Wear comfortable clothing and clothing appropriate for easy access to any Portacath or PICC line.   We strive to give you quality time with your provider. You may need to reschedule your appointment if you arrive late (15 or more minutes).  Arriving late affects you and other patients whose appointments are after yours.  Also, if you miss three or more appointments without notifying the office, you may be dismissed from the clinic at the provider's discretion.      For prescription refill requests, have your pharmacy contact our office and allow 72 hours for refills to be completed.         To help prevent nausea and vomiting after your treatment, we encourage you to take your nausea medication as directed.  BELOW ARE SYMPTOMS THAT SHOULD BE REPORTED IMMEDIATELY: *FEVER GREATER THAN 100.4 F (38 C) OR HIGHER *CHILLS OR SWEATING *NAUSEA AND VOMITING THAT IS NOT CONTROLLED WITH YOUR NAUSEA MEDICATION *UNUSUAL SHORTNESS OF BREATH *UNUSUAL BRUISING OR BLEEDING *URINARY PROBLEMS (pain or burning when urinating, or frequent urination) *BOWEL PROBLEMS (unusual diarrhea, constipation, pain near the anus) TENDERNESS IN MOUTH AND THROAT WITH OR WITHOUT PRESENCE OF ULCERS (sore throat, sores in mouth, or a toothache) UNUSUAL RASH, SWELLING OR PAIN  UNUSUAL VAGINAL DISCHARGE OR ITCHING   Items with * indicate a potential emergency and should be followed up as soon as possible or go to the Emergency Department if any problems should occur.  Please show the CHEMOTHERAPY ALERT CARD or IMMUNOTHERAPY ALERT CARD at check-in to the Emergency Department and triage nurse.  Should you have questions after your visit or need to  cancel or reschedule your appointment, please contact [facility] [phone]  and follow the prompts.  Office hours are 8:00 a.m. to 4:30 p.m. Monday - Friday. Please note that voicemails left after 4:00 p.m. may not be returned until the following business day.  We are closed weekends and major holidays. You have access to a nurse at all times for urgent questions. Please call the main number to the clinic [phone] and follow the prompts.  For any non-urgent questions, you may also contact your provider using MyChart. We now offer e-Visits for anyone 18 and older to request care online for non-urgent symptoms. For details visit [email or web address].   Also download the MyChart app! Go to the app store, search "MyChart", open the app, select [facility], and log in with your MyChart username and password.  Masks are optional in the cancer centers. If you would like for your care team to wear a mask while they are taking care of you, please let them know. For doctor visits, patients may have with them one support person who is at least 59 years old. At this time, visitors are not allowed in the infusion area.  

## 2022-04-09 NOTE — Progress Notes (Signed)
Patient presents today for therapeutic phlebotomy.  Patient is in satisfactory condition with no complaints voiced.  Vital signs are stable.  We will proceed with phlebotomy per provider orders.   Phlebotomy started at 1113 and ended at 1119.  250 mL drawn off.  Patient tolerated procedure well with no complaints voiced.  Vitals remained stable.  Patient refused to wait the recommended 30 minute post phlebotomy wait time.  He agreed to wait 10 minutes.  Patient left ambulatory in stable condition.

## 2022-04-16 ENCOUNTER — Inpatient Hospital Stay (HOSPITAL_COMMUNITY): Payer: Managed Care, Other (non HMO)

## 2022-04-16 ENCOUNTER — Encounter (HOSPITAL_COMMUNITY): Payer: Self-pay

## 2022-04-16 LAB — CBC
HCT: 44.2 % (ref 39.0–52.0)
Hemoglobin: 15.2 g/dL (ref 13.0–17.0)
MCH: 32.5 pg (ref 26.0–34.0)
MCHC: 34.4 g/dL (ref 30.0–36.0)
MCV: 94.4 fL (ref 80.0–100.0)
Platelets: 301 10*3/uL (ref 150–400)
RBC: 4.68 MIL/uL (ref 4.22–5.81)
RDW: 11.7 % (ref 11.5–15.5)
WBC: 7.7 10*3/uL (ref 4.0–10.5)
nRBC: 0 % (ref 0.0–0.2)

## 2022-04-16 NOTE — Progress Notes (Signed)
Garrett Lin presents today for phlebotomy per MD orders. Phlebotomy procedure started at 1004 and ended at 1008. 250 cc removed. Patient tolerated procedure well. IV needle removed intact.  Patient tolerated phlebotomy with no complaints voiced.  Peripheral IV site intact with no bruising or swelling noted.  Denied SOB, chest pain, or dizziness.  Gauze with coban applied.  Discharged with VSS and no s/s of distress noted.

## 2022-04-16 NOTE — Patient Instructions (Signed)
[facility]  Discharge Instructions: Thank you for choosing [facility] to provide your oncology and hematology care.  If you have a lab appointment with the Cancer Center, please come in thru the Main Entrance and check in at the main information desk.  Wear comfortable clothing and clothing appropriate for easy access to any Portacath or PICC line.   We strive to give you quality time with your provider. You may need to reschedule your appointment if you arrive late (15 or more minutes).  Arriving late affects you and other patients whose appointments are after yours.  Also, if you miss three or more appointments without notifying the office, you may be dismissed from the clinic at the provider's discretion.      For prescription refill requests, have your pharmacy contact our office and allow 72 hours for refills to be completed.    Therapeutic Phlebotomy, Care After The following information offers guidance on how to care for yourself after your procedure. Your health care provider may also give you more specific instructions. If you have problems or questions, contact your health care provider. What can I expect after the procedure? After therapeutic phlebotomy, it is common to have: Light-headedness or dizziness. You may feel faint. Nausea. Tiredness (fatigue). Follow these instructions at home: Eating and drinking Be sure to eat well-balanced meals for the next 24 hours. Drink enough fluid to keep your urine pale yellow. Avoid drinking alcohol on the day that you had the procedure. Activity  Return to your normal activities as told by your health care provider. Most people can go back to their normal activities right away. Avoid activities that take a lot of effort for about 5 hours after the procedure. Athletes should avoid strenuous exercise for at least 12 hours. Avoid heavy lifting or pulling for about 5 hours after the procedure. Do not lift anything  that is heavier than 10 lb (4.5 kg). Change positions slowly for the remainder of the day, like from sitting to standing. This can help prevent light-headedness or fainting. If you feel light-headed, lie down until the feeling goes away. Needle insertion site care  Keep your bandage (dressing) dry. You can remove the bandage after about 5 hours or as told by your health care provider. If you have bleeding from the needle insertion site, raise (elevate) your arm and press firmly on the site until the bleeding stops. If you have bruising at the site, apply ice to the area. To do this: Put ice in a plastic bag. Place a towel between your skin and the bag. Leave the ice on for 20 minutes, 2-3 times a day for the first 24 hours. Remove the ice if your skin turns bright red so you do not damage the area. If the swelling does not go away after 24 hours, apply a warm, moist cloth (warm compress) to the area for 20 minutes, 2-3 times a day. General instructions Do not use any products that contain nicotine or tobacco, like cigarettes, chewing tobacco, and vaping devices, such as e-cigarettes, for at least 30 minutes after the procedure. If you need help quitting, ask your health care provider. Keep all follow-up visits. You may need to continue having regular blood tests and therapeutic phlebotomy treatments as directed. Contact a health care provider if: You have redness, swelling, or pain at the needle insertion site. Fluid or blood is coming from the needle insertion site. Pus or a bad smell is coming from the   needle insertion site. The needle insertion site feels warm to the touch. You feel light-headed, dizzy, or nauseous, and the feeling does not go away. You have new bruising at the needle insertion site. You feel weaker than normal. You have a fever or chills. Get help right away if: You have chest pain. You have trouble breathing. You have severe nausea or vomiting. Summary After the  procedure, it is common to have some light-headedness, dizziness, nausea, or tiredness (fatigue). Be sure to eat well-balanced meals for the next 24 hours. Drink enough fluid to keep your urine pale yellow. Return to your normal activities as told by your health care provider. Keep all follow-up visits. You may need to continue having regular blood tests and therapeutic phlebotomy treatments as directed. This information is not intended to replace advice given to you by your health care provider. Make sure you discuss any questions you have with your health care provider. Document Revised: 03/18/2021 Document Reviewed: 03/18/2021 Elsevier Patient Education  2023 [employer]    To help prevent nausea and vomiting after your treatment, we encourage you to take your nausea medication as directed.  BELOW ARE SYMPTOMS THAT SHOULD BE REPORTED IMMEDIATELY: *FEVER GREATER THAN 100.4 F (38 C) OR HIGHER *CHILLS OR SWEATING *NAUSEA AND VOMITING THAT IS NOT CONTROLLED WITH YOUR NAUSEA MEDICATION *UNUSUAL SHORTNESS OF BREATH *UNUSUAL BRUISING OR BLEEDING *URINARY PROBLEMS (pain or burning when urinating, or frequent urination) *BOWEL PROBLEMS (unusual diarrhea, constipation, pain near the anus) TENDERNESS IN MOUTH AND THROAT WITH OR WITHOUT PRESENCE OF ULCERS (sore throat, sores in mouth, or a toothache) UNUSUAL RASH, SWELLING OR PAIN  UNUSUAL VAGINAL DISCHARGE OR ITCHING   Items with * indicate a potential emergency and should be followed up as soon as possible or go to the Emergency Department if any problems should occur.  Please show the CHEMOTHERAPY ALERT CARD or IMMUNOTHERAPY ALERT CARD at check-in to the Emergency Department and triage nurse.  Should you have questions after your visit or need to cancel or reschedule your appointment, please contact [facility] [phone]  and follow the prompts.  Office hours are 8:00 a.m. to 4:30 p.m. Monday - Friday. Please note that  voicemails left after 4:00 p.m. may not be returned until the following business day.  We are closed weekends and major holidays. You have access to a nurse at all times for urgent questions. Please call the main number to the clinic [phone] and follow the prompts.  For any non-urgent questions, you may also contact your provider using MyChart. We now offer e-Visits for anyone 18 and older to request care online for non-urgent symptoms. For details visit [email or web address].   Also download the MyChart app! Go to the app store, search "MyChart", open the app, select [place], and log in with your MyChart username and password.  Masks are optional in the cancer centers. If you would like for your care team to wear a mask while they are taking care of you, please let them know. For doctor visits, patients may have with them one support person who is at least 59 years old. At this time, visitors are not allowed in the infusion area.  

## 2022-04-23 ENCOUNTER — Inpatient Hospital Stay (HOSPITAL_COMMUNITY): Payer: Managed Care, Other (non HMO)

## 2022-04-23 NOTE — Patient Instructions (Signed)
[facility]  Discharge Instructions: Thank you for choosing [facility] to provide your oncology and hematology care.  If you have a lab appointment with the Cancer Center, please come in thru the Main Entrance and check in at the main information desk.  Wear comfortable clothing and clothing appropriate for easy access to any Portacath or PICC line.   We strive to give you quality time with your provider. You may need to reschedule your appointment if you arrive late (15 or more minutes).  Arriving late affects you and other patients whose appointments are after yours.  Also, if you miss three or more appointments without notifying the office, you may be dismissed from the clinic at the provider's discretion.      For prescription refill requests, have your pharmacy contact our office and allow 72 hours for refills to be completed.    Today you received the following chemotherapy and/or immunotherapy agents Phlebotomy      To help prevent nausea and vomiting after your treatment, we encourage you to take your nausea medication as directed.  BELOW ARE SYMPTOMS THAT SHOULD BE REPORTED IMMEDIATELY: *FEVER GREATER THAN 100.4 F (38 C) OR HIGHER *CHILLS OR SWEATING *NAUSEA AND VOMITING THAT IS NOT CONTROLLED WITH YOUR NAUSEA MEDICATION *UNUSUAL SHORTNESS OF BREATH *UNUSUAL BRUISING OR BLEEDING *URINARY PROBLEMS (pain or burning when urinating, or frequent urination) *BOWEL PROBLEMS (unusual diarrhea, constipation, pain near the anus) TENDERNESS IN MOUTH AND THROAT WITH OR WITHOUT PRESENCE OF ULCERS (sore throat, sores in mouth, or a toothache) UNUSUAL RASH, SWELLING OR PAIN  UNUSUAL VAGINAL DISCHARGE OR ITCHING   Items with * indicate a potential emergency and should be followed up as soon as possible or go to the Emergency Department if any problems should occur.  Please show the CHEMOTHERAPY ALERT CARD or IMMUNOTHERAPY ALERT CARD at check-in to the Emergency  Department and triage nurse.  Should you have questions after your visit or need to cancel or reschedule your appointment, please contact [facility] [phone]  and follow the prompts.  Office hours are 8:00 a.m. to 4:30 p.m. Monday - Friday. Please note that voicemails left after 4:00 p.m. may not be returned until the following business day.  We are closed weekends and major holidays. You have access to a nurse at all times for urgent questions. Please call the main number to the clinic [phone] and follow the prompts.  For any non-urgent questions, you may also contact your provider using MyChart. We now offer e-Visits for anyone 52 and older to request care online for non-urgent symptoms. For details visit [email or web address].   Also download the MyChart app! Go to the app store, search "MyChart", open the app, select [place], and log in with your MyChart username and password.  Masks are optional in the cancer centers. If you would like for your care team to wear a mask while they are taking care of you, please let them know. For doctor visits, patients may have with them one support person who is at least 59 years old. At this time, visitors are not allowed in the infusion area.

## 2022-04-23 NOTE — Progress Notes (Signed)
Patient presents today for phlebotomy per MD orders. Phlebotomy procedure started at 1125 and ended at 1128. 250 cc removed. Patient tolerated procedure well. Procedure tolerated well and without incident. Discharged ambulatory in stable condition.

## 2022-04-30 ENCOUNTER — Inpatient Hospital Stay (HOSPITAL_COMMUNITY): Payer: Managed Care, Other (non HMO)

## 2022-04-30 ENCOUNTER — Encounter (HOSPITAL_COMMUNITY): Payer: Self-pay

## 2022-04-30 NOTE — Progress Notes (Signed)
Patient presents today for half phlebotomy per MD orders. Phlebotomy procedure started at 1057 and ended at 1107. 250cc removed. Patient tolerated procedure well and without incident, patient did not want to wait, wait time. Discharged ambulatory in stable condition.

## 2022-04-30 NOTE — Patient Instructions (Signed)
[facility]  Discharge Instructions: Thank you for choosing [facility] to provide your oncology and hematology care.  If you have a lab appointment with the Cancer Center, please come in thru the Main Entrance and check in at the main information desk.  Wear comfortable clothing and clothing appropriate for easy access to any Portacath or PICC line.   We strive to give you quality time with your provider. You may need to reschedule your appointment if you arrive late (15 or more minutes).  Arriving late affects you and other patients whose appointments are after yours.  Also, if you miss three or more appointments without notifying the office, you may be dismissed from the clinic at the provider's discretion.      For prescription refill requests, have your pharmacy contact our office and allow 72 hours for refills to be completed.    Today you received the following: half phlebotomy, return as scheduled.   To help prevent nausea and vomiting after your treatment, we encourage you to take your nausea medication as directed.  BELOW ARE SYMPTOMS THAT SHOULD BE REPORTED IMMEDIATELY: *FEVER GREATER THAN 100.4 F (38 C) OR HIGHER *CHILLS OR SWEATING *NAUSEA AND VOMITING THAT IS NOT CONTROLLED WITH YOUR NAUSEA MEDICATION *UNUSUAL SHORTNESS OF BREATH *UNUSUAL BRUISING OR BLEEDING *URINARY PROBLEMS (pain or burning when urinating, or frequent urination) *BOWEL PROBLEMS (unusual diarrhea, constipation, pain near the anus) TENDERNESS IN MOUTH AND THROAT WITH OR WITHOUT PRESENCE OF ULCERS (sore throat, sores in mouth, or a toothache) UNUSUAL RASH, SWELLING OR PAIN  UNUSUAL VAGINAL DISCHARGE OR ITCHING   Items with * indicate a potential emergency and should be followed up as soon as possible or go to the Emergency Department if any problems should occur.  Please show the CHEMOTHERAPY ALERT CARD or IMMUNOTHERAPY ALERT CARD at check-in to the Emergency Department and triage  nurse.  Should you have questions after your visit or need to cancel or reschedule your appointment, please contact [facility] [phone]  and follow the prompts.  Office hours are 8:00 a.m. to 4:30 p.m. Monday - Friday. Please note that voicemails left after 4:00 p.m. may not be returned until the following business day.  We are closed weekends and major holidays. You have access to a nurse at all times for urgent questions. Please call the main number to the clinic [phone] and follow the prompts.  For any non-urgent questions, you may also contact your provider using MyChart. We now offer e-Visits for anyone 59 and older to request care online for non-urgent symptoms. For details visit [email or web address].   Also download the MyChart app! Go to the app store, search "MyChart", open the app, select [place], and log in with your MyChart username and password.  Masks are optional in the cancer centers. If you would like for your care team to wear a mask while they are taking care of you, please let them know. For doctor visits, patients may have with them one support person who is at least 59 years old. At this time, visitors are not allowed in the infusion area.

## 2022-05-07 ENCOUNTER — Inpatient Hospital Stay: Payer: Managed Care, Other (non HMO) | Attending: Hematology

## 2022-05-07 NOTE — Patient Instructions (Signed)
[facility]  Discharge Instructions: Thank you for choosing [facility] to provide your oncology and hematology care.  If you have a lab appointment with the Cancer Center, please come in thru the Main Entrance and check in at the main information desk.  Wear comfortable clothing and clothing appropriate for easy access to any Portacath or PICC line.   We strive to give you quality time with your provider. You may need to reschedule your appointment if you arrive late (15 or more minutes).  Arriving late affects you and other patients whose appointments are after yours.  Also, if you miss three or more appointments without notifying the office, you may be dismissed from the clinic at the provider's discretion.      For prescription refill requests, have your pharmacy contact our office and allow 72 hours for refills to be completed.      To help prevent nausea and vomiting after your treatment, we encourage you to take your nausea medication as directed.  BELOW ARE SYMPTOMS THAT SHOULD BE REPORTED IMMEDIATELY: *FEVER GREATER THAN 100.4 F (38 C) OR HIGHER *CHILLS OR SWEATING *NAUSEA AND VOMITING THAT IS NOT CONTROLLED WITH YOUR NAUSEA MEDICATION *UNUSUAL SHORTNESS OF BREATH *UNUSUAL BRUISING OR BLEEDING *URINARY PROBLEMS (pain or burning when urinating, or frequent urination) *BOWEL PROBLEMS (unusual diarrhea, constipation, pain near the anus) TENDERNESS IN MOUTH AND THROAT WITH OR WITHOUT PRESENCE OF ULCERS (sore throat, sores in mouth, or a toothache) UNUSUAL RASH, SWELLING OR PAIN  UNUSUAL VAGINAL DISCHARGE OR ITCHING   Items with * indicate a potential emergency and should be followed up as soon as possible or go to the Emergency Department if any problems should occur.  Please show the CHEMOTHERAPY ALERT CARD or IMMUNOTHERAPY ALERT CARD at check-in to the Emergency Department and triage nurse.  Should you have questions after your visit or need to  cancel or reschedule your appointment, please contact [facility] [phone]  and follow the prompts.  Office hours are 8:00 a.m. to 4:30 p.m. Monday - Friday. Please note that voicemails left after 4:00 p.m. may not be returned until the following business day.  We are closed weekends and major holidays. You have access to a nurse at all times for urgent questions. Please call the main number to the clinic [phone] and follow the prompts.  For any non-urgent questions, you may also contact your provider using MyChart. We now offer e-Visits for anyone 18 and older to request care online for non-urgent symptoms. For details visit [email or web address].   Also download the MyChart app! Go to the app store, search "MyChart", open the app, select , and log in with your MyChart username and password.  Masks are optional in the cancer centers. If you would like for your care team to wear a mask while they are taking care of you, please let them know. For doctor visits, patients may have with them one support person who is at least 59 years old. At this time, visitors are not allowed in the infusion area.  

## 2022-05-07 NOTE — Progress Notes (Signed)
Patient presents today for therapeutic phlebotomy.  Patient is in satisfactory condition with no complaints voiced.  Vital signs are stable.  Hemoglobin on 04/16/22 was 15.2 and hematocrit was 44.2.  We will proceed with phlebotomy per provider orders.  Therapeutic phlebotomy started at 1120 and ended at 1126.  250 cc removed.  Patient refused to wait the recommended 30 minute post wait time.  Patient left ambulatory in stable condition with no complaints voiced.

## 2022-05-14 ENCOUNTER — Inpatient Hospital Stay: Payer: Managed Care, Other (non HMO)

## 2022-05-14 LAB — CBC
HCT: 42.6 % (ref 39.0–52.0)
Hemoglobin: 15 g/dL (ref 13.0–17.0)
MCH: 32.6 pg (ref 26.0–34.0)
MCHC: 35.2 g/dL (ref 30.0–36.0)
MCV: 92.6 fL (ref 80.0–100.0)
Platelets: 303 10*3/uL (ref 150–400)
RBC: 4.6 MIL/uL (ref 4.22–5.81)
RDW: 11.6 % (ref 11.5–15.5)
WBC: 8.4 10*3/uL (ref 4.0–10.5)
nRBC: 0 % (ref 0.0–0.2)

## 2022-05-14 NOTE — Progress Notes (Signed)
Garrett Lin presents today for phlebotomy per MD orders. Phlebotomy procedure started at 1200 and ended at 1202. 250 cc removed. Patient tolerated procedure well. IV needle removed intact.  Patient tolerated phlebotomy with no complaints voiced.  Peripheral IV site intact with no bruising or swelling noted.  Denied SOB, chest pain, or dizziness.  Gauze with coban applied.  Discharged with VSS and no s/s of distress noted.

## 2022-05-14 NOTE — Patient Instructions (Signed)
[facility]  Discharge Instructions: Thank you for choosing [facility] to provide your oncology and hematology care.  If you have a lab appointment with the Cancer Center, please come in thru the Main Entrance and check in at the main information desk.  Wear comfortable clothing and clothing appropriate for easy access to any Portacath or PICC line.   We strive to give you quality time with your provider. You may need to reschedule your appointment if you arrive late (15 or more minutes).  Arriving late affects you and other patients whose appointments are after yours.  Also, if you miss three or more appointments without notifying the office, you may be dismissed from the clinic at the provider's discretion.      For prescription refill requests, have your pharmacy contact our office and allow 72 hours for refills to be completed.    Therapeutic Phlebotomy, Care After The following information offers guidance on how to care for yourself after your procedure. Your health care provider may also give you more specific instructions. If you have problems or questions, contact your health care provider. What can I expect after the procedure? After therapeutic phlebotomy, it is common to have: Light-headedness or dizziness. You may feel faint. Nausea. Tiredness (fatigue). Follow these instructions at home: Eating and drinking Be sure to eat well-balanced meals for the next 24 hours. Drink enough fluid to keep your urine pale yellow. Avoid drinking alcohol on the day that you had the procedure. Activity  Return to your normal activities as told by your health care provider. Most people can go back to their normal activities right away. Avoid activities that take a lot of effort for about 5 hours after the procedure. Athletes should avoid strenuous exercise for at least 12 hours. Avoid heavy lifting or pulling for about 5 hours after the procedure. Do not lift  anything that is heavier than 10 lb (4.5 kg). Change positions slowly for the remainder of the day, like from sitting to standing. This can help prevent light-headedness or fainting. If you feel light-headed, lie down until the feeling goes away. Needle insertion site care  Keep your bandage (dressing) dry. You can remove the bandage after about 5 hours or as told by your health care provider. If you have bleeding from the needle insertion site, raise (elevate) your arm and press firmly on the site until the bleeding stops. If you have bruising at the site, apply ice to the area. To do this: Put ice in a plastic bag. Place a towel between your skin and the bag. Leave the ice on for 20 minutes, 2-3 times a day for the first 24 hours. Remove the ice if your skin turns bright red so you do not damage the area. If the swelling does not go away after 24 hours, apply a warm, moist cloth (warm compress) to the area for 20 minutes, 2-3 times a day. General instructions Do not use any products that contain nicotine or tobacco, like cigarettes, chewing tobacco, and vaping devices, such as e-cigarettes, for at least 30 minutes after the procedure. If you need help quitting, ask your health care provider. Keep all follow-up visits. You may need to continue having regular blood tests and therapeutic phlebotomy treatments as directed. Contact a health care provider if: You have redness, swelling, or pain at the needle insertion site. Fluid or blood is coming from the needle insertion site. Pus or a bad smell is coming from  the needle insertion site. The needle insertion site feels warm to the touch. You feel light-headed, dizzy, or nauseous, and the feeling does not go away. You have new bruising at the needle insertion site. You feel weaker than normal. You have a fever or chills. Get help right away if: You have chest pain. You have trouble breathing. You have severe nausea or  vomiting. Summary After the procedure, it is common to have some light-headedness, dizziness, nausea, or tiredness (fatigue). Be sure to eat well-balanced meals for the next 24 hours. Drink enough fluid to keep your urine pale yellow. Return to your normal activities as told by your health care provider. Keep all follow-up visits. You may need to continue having regular blood tests and therapeutic phlebotomy treatments as directed. This information is not intended to replace advice given to you by your health care provider. Make sure you discuss any questions you have with your health care provider. Document Revised: 03/18/2021 Document Reviewed: 03/18/2021 Elsevier Patient Education  2023 Else[employer] Inc[employer]     To help prevent nausea and vomiting after your treatment, we encourage you to take your nausea medication as directed.  BELOW ARE SYMPTOMS THAT SHOULD BE REPORTED IMMEDIATELY: *FEVER GREATER THAN 100.4 F (38 C) OR HIGHER *CHILLS OR SWEATING *NAUSEA AND VOMITING THAT IS NOT CONTROLLED WITH YOUR NAUSEA MEDICATION *UNUSUAL SHORTNESS OF BREATH *UNUSUAL BRUISING OR BLEEDING *URINARY PROBLEMS (pain or burning when urinating, or frequent urination) *BOWEL PROBLEMS (unusual diarrhea, constipation, pain near the anus) TENDERNESS IN MOUTH AND THROAT WITH OR WITHOUT PRESENCE OF ULCERS (sore throat, sores in mouth, or a toothache) UNUSUAL RASH, SWELLING OR PAIN  UNUSUAL VAGINAL DISCHARGE OR ITCHING   Items with * indicate a potential emergency and should be followed up as soon as possible or go to the Emergency Department if any problems should occur.  Please show the CHEMOTHERAPY ALERT CARD or IMMUNOTHERAPY ALERT CARD at check-in to the Emergency Department and triage nurse.  Should you have questions after your visit or need to cancel or reschedule your appointment, please contact [facility] [phone]  and follow the prompts.  Office hours are 8:00 a.m. to 4:30 p.m.  Monday - Friday. Please note that voicemails left after 4:00 p.m. may not be returned until the following business day.  We are closed weekends and major holidays. You have access to a nurse at all times for urgent questions. Please call the main number to the clinic [phone] and follow the prompts.  For any non-urgent questions, you may also contact your provider using MyChart. We now offer e-Visits for anyone 74 and older to request care online for non-urgent symptoms. For details visit [email or web address].   Also download the MyChart app! Go to the app store, search "MyChart", open the app, select [place], and log in with your MyChart username and password.  Masks are optional in the cancer centers. If you would like for your care team to wear a mask while they are taking care of you, please let them know. For doctor visits, patients may have with them one support person who is at least 59 years old. At this time, visitors are not allowed in the infusion area.

## 2022-05-21 ENCOUNTER — Inpatient Hospital Stay (HOSPITAL_COMMUNITY): Payer: Managed Care, Other (non HMO) | Attending: Physician Assistant

## 2022-05-21 NOTE — Progress Notes (Signed)
Patient presents today for phlebotomy per MD orders. Phlebotomy procedure started at 1040 and ended at 1045. 250 cc removed. Patient tolerated procedure well. Procedure tolerated well and without incident. Discharged ambulatory in stable condition.

## 2022-05-21 NOTE — Patient Instructions (Signed)
[facility] [place]  Discharge Instructions: Thank you for choosing [facility] to provide your oncology and hematology care.  If you have a lab appointment with the Cancer Center, please come in thru the Main Entrance and check in at the main information desk.  Wear comfortable clothing and clothing appropriate for easy access to any Portacath or PICC line.   We strive to give you quality time with your provider. You may need to reschedule your appointment if you arrive late (15 or more minutes).  Arriving late affects you and other patients whose appointments are after yours.  Also, if you miss three or more appointments without notifying the office, you may be dismissed from the clinic at the provider's discretion.      For prescription refill requests, have your pharmacy contact our office and allow 72 hours for refills to be completed.    Today you received the following chemotherapy and/or immunotherapy agents Phlebotomy      To help prevent nausea and vomiting after your treatment, we encourage you to take your nausea medication as directed.  BELOW ARE SYMPTOMS THAT SHOULD BE REPORTED IMMEDIATELY: *FEVER GREATER THAN 100.4 F (38 C) OR HIGHER *CHILLS OR SWEATING *NAUSEA AND VOMITING THAT IS NOT CONTROLLED WITH YOUR NAUSEA MEDICATION *UNUSUAL SHORTNESS OF BREATH *UNUSUAL BRUISING OR BLEEDING *URINARY PROBLEMS (pain or burning when urinating, or frequent urination) *BOWEL PROBLEMS (unusual diarrhea, constipation, pain near the anus) TENDERNESS IN MOUTH AND THROAT WITH OR WITHOUT PRESENCE OF ULCERS (sore throat, sores in mouth, or a toothache) UNUSUAL RASH, SWELLING OR PAIN  UNUSUAL VAGINAL DISCHARGE OR ITCHING   Items with * indicate a potential emergency and should be followed up as soon as possible or go to the Emergency Department if any problems should occur.  Please show the CHEMOTHERAPY ALERT CARD or IMMUNOTHERAPY ALERT CARD at check-in to the  Emergency Department and triage nurse.  Should you have questions after your visit or need to cancel or reschedule your appointment, please contact [facility] [place] [phone]  and follow the prompts.  Office hours are 8:00 a.m. to 4:30 p.m. Monday - Friday. Please note that voicemails left after 4:00 p.m. may not be returned until the following business day.  We are closed weekends and major holidays. You have access to a nurse at all times for urgent questions. Please call the main number to the clinic [phone] and follow the prompts.  For any non-urgent questions, you may also contact your provider using MyChart. We now offer e-Visits for anyone 18 and older to request care online for non-urgent symptoms. For details visit [email or web address].   Also download the MyChart app! Go to the app store, search "MyChart", open the app, select [place], and log in with your MyChart username and password.  Masks are optional in the cancer centers. If you would like for your care team to wear a mask while they are taking care of you, please let them know. You may have one support person who is at least 59 years old accompany you for your appointments.  

## 2022-05-28 ENCOUNTER — Inpatient Hospital Stay: Payer: Managed Care, Other (non HMO)

## 2022-05-28 NOTE — Progress Notes (Signed)
Garrett Lin presents for therapeutic phlebotomy per MD orders. Last HGB 15.0 / HCT 42.6 on 05-14-2022 . Vital signs stable prior to procedure. Procedure started at 11:26 am and ended at 11:28 am. 250 mls of blood removed. Patient denies any dizziness , lightheadedness, or feeling faint.  Gauze and coban applied to site. Vital signs stable at completion of procedure. Patient has no complaints at this time. Alert and oriented x 3. Discharged in stable condition prior to 30 minute observation post phlebotomy time. Patient's request.

## 2022-05-28 NOTE — Patient Instructions (Signed)
[facility]  Discharge Instructions: Thank you for choosing [facility] to provide your oncology and hematology care.  If you have a lab appointment with the Cancer Center, please come in thru the Main Entrance and check in at the main information desk.  Wear comfortable clothing and clothing appropriate for easy access to any Portacath or PICC line.   We strive to give you quality time with your provider. You may need to reschedule your appointment if you arrive late (15 or more minutes).  Arriving late affects you and other patients whose appointments are after yours.  Also, if you miss three or more appointments without notifying the office, you may be dismissed from the clinic at the provider's discretion.      For prescription refill requests, have your pharmacy contact our office and allow 72 hours for refills to be completed.    Today you received the following chemotherapy and/or immunotherapy agents Phlebotomy.  Therapeutic Phlebotomy Discharge Instructions  - Increase your fluid intake over the next 4 hours  - No smoking for 30 minutes  - Avoid using the affected arm (the one you had the blood drawn from) for heavy lifting or other activities.  - You may resume all normal activities after 30 minutes.  You are to notify the office if you experience:   - Persistent dizziness and/or lightheadedness -Uncontrolled or excessive bleeding at the site.        To help prevent nausea and vomiting after your treatment, we encourage you to take your nausea medication as directed.  BELOW ARE SYMPTOMS THAT SHOULD BE REPORTED IMMEDIATELY: *FEVER GREATER THAN 100.4 F (38 C) OR HIGHER *CHILLS OR SWEATING *NAUSEA AND VOMITING THAT IS NOT CONTROLLED WITH YOUR NAUSEA MEDICATION *UNUSUAL SHORTNESS OF BREATH *UNUSUAL BRUISING OR BLEEDING *URINARY PROBLEMS (pain or burning when urinating, or frequent urination) *BOWEL PROBLEMS (unusual diarrhea,  constipation, pain near the anus) TENDERNESS IN MOUTH AND THROAT WITH OR WITHOUT PRESENCE OF ULCERS (sore throat, sores in mouth, or a toothache) UNUSUAL RASH, SWELLING OR PAIN  UNUSUAL VAGINAL DISCHARGE OR ITCHING   Items with * indicate a potential emergency and should be followed up as soon as possible or go to the Emergency Department if any problems should occur.  Please show the CHEMOTHERAPY ALERT CARD or IMMUNOTHERAPY ALERT CARD at check-in to the Emergency Department and triage nurse.  Should you have questions after your visit or need to cancel or reschedule your appointment, please contact [facility] AT [facility] [phone]  and follow the prompts.  Office hours are 8:00 a.m. to 4:30 p.m. Monday - Friday. Please note that voicemails left after 4:00 p.m. may not be returned until the following business day.  We are closed weekends and major holidays. You have access to a nurse at all times for urgent questions. Please call the main number to the clinic [phone] and follow the prompts.  For any non-urgent questions, you may also contact your provider using MyChart. We now offer e-Visits for anyone 81 and older to request care online for non-urgent symptoms. For details visit [email or web address].   Also download the MyChart app! Go to the app store, search "MyChart", open the app, select [place], and log in with your MyChart username and password.  Masks are optional in the cancer centers. If you would like for your care team to wear a mask while they are taking care of you, please let them know. You may have one support person  who is at least 59 years old accompany you for your appointments.

## 2022-06-04 ENCOUNTER — Inpatient Hospital Stay: Payer: Managed Care, Other (non HMO) | Attending: Hematology

## 2022-06-04 ENCOUNTER — Inpatient Hospital Stay: Payer: Managed Care, Other (non HMO)

## 2022-06-04 NOTE — Patient Instructions (Signed)
[facility] [place]  Discharge Instructions: Thank you for choosing [facility] to provide your oncology and hematology care.  If you have a lab appointment with the Cancer Center, please come in thru the Main Entrance and check in at the main information desk.  Wear comfortable clothing and clothing appropriate for easy access to any Portacath or PICC line.   We strive to give you quality time with your provider. You may need to reschedule your appointment if you arrive late (15 or more minutes).  Arriving late affects you and other patients whose appointments are after yours.  Also, if you miss three or more appointments without notifying the office, you may be dismissed from the clinic at the provider's discretion.      For prescription refill requests, have your pharmacy contact our office and allow 72 hours for refills to be completed.     To help prevent nausea and vomiting after your treatment, we encourage you to take your nausea medication as directed.  BELOW ARE SYMPTOMS THAT SHOULD BE REPORTED IMMEDIATELY: *FEVER GREATER THAN 100.4 F (38 C) OR HIGHER *CHILLS OR SWEATING *NAUSEA AND VOMITING THAT IS NOT CONTROLLED WITH YOUR NAUSEA MEDICATION *UNUSUAL SHORTNESS OF BREATH *UNUSUAL BRUISING OR BLEEDING *URINARY PROBLEMS (pain or burning when urinating, or frequent urination) *BOWEL PROBLEMS (unusual diarrhea, constipation, pain near the anus) TENDERNESS IN MOUTH AND THROAT WITH OR WITHOUT PRESENCE OF ULCERS (sore throat, sores in mouth, or a toothache) UNUSUAL RASH, SWELLING OR PAIN  UNUSUAL VAGINAL DISCHARGE OR ITCHING   Items with * indicate a potential emergency and should be followed up as soon as possible or go to the Emergency Department if any problems should occur.  Please show the CHEMOTHERAPY ALERT CARD or IMMUNOTHERAPY ALERT CARD at check-in to the Emergency Department and triage nurse.  Should you have questions after your visit or need to  cancel or reschedule your appointment, please contact [facility] [place] [phone]  and follow the prompts.  Office hours are 8:00 a.m. to 4:30 p.m. Monday - Friday. Please note that voicemails left after 4:00 p.m. may not be returned until the following business day.  We are closed weekends and major holidays. You have access to a nurse at all times for urgent questions. Please call the main number to the clinic [phone] and follow the prompts.  For any non-urgent questions, you may also contact your provider using MyChart. We now offer e-Visits for anyone 18 and older to request care online for non-urgent symptoms. For details visit [email or web address].   Also download the MyChart app! Go to the app store, search "MyChart", open the app, select [place], and log in with your MyChart username and password.  Masks are optional in the cancer centers. If you would like for your care team to wear a mask while they are taking care of you, please let them know. You may have one support person who is at least 59 years old accompany you for your appointments.  

## 2022-06-04 NOTE — Progress Notes (Signed)
Patient presents today for therapeutic phlebotomy.  Patient is in satisfactory condition with no complaints voiced.  Vital signs are stable.  We will proceed with phlebotomy per MD orders.  Therapeutic phlebotomy started at 0934 and ended 0939.  250 mL of blood was removed. Patient remained stable during procedure.  Vital signs remained stable.   Patient refused to wait the entire recommended 30 minute post wait time.  Patient left ambulatory in stable condition with no complaints voiced.

## 2022-06-11 ENCOUNTER — Inpatient Hospital Stay: Payer: Managed Care, Other (non HMO)

## 2022-06-24 NOTE — Progress Notes (Signed)
[facility] [address], [place] [address]   CLINIC:  Medical Oncology/Hematology  PCP:  Gwenlyn Fudge, FNP [address] [place] [address] [phone]   REASON FOR VISIT:  Follow-up for hereditary hemochromatosis   PRIOR THERAPY: Weekly phlebotomy (500 mL each)   CURRENT THERAPY: Weekly phlebotomy (250 mL each)  INTERVAL HISTORY:  Garrett Lin 61 y.o. male returns for routine follow-up of hereditary hemochromatosis.  He was last seen by Rojelio Brenner PA-C on 02/19/2022.  At today's visit, he reports feeling well.  He is tolerating his weekly phlebotomy of 250 mL well, although he did miss his last 2 phlebotomies due to being on vacation.  He reports that he is continuing to feel better.  He reports decreased joint pain, improved energy, and improved appetite.  He continues to have some abdominal pain after eating.  He has decreased bronzing of his skin.  He has no acute complaints at today's visit.  He has 100% energy and 100% appetite. He endorses that he is maintaining a stable weight.   REVIEW OF SYSTEMS:  Review of Systems  Constitutional:  Negative for appetite change, chills, diaphoresis, fatigue, fever and unexpected weight change.  HENT:   Negative for lump/mass and nosebleeds.   Eyes:  Negative for eye problems.  Respiratory:  Negative for cough, hemoptysis and shortness of breath.   Cardiovascular:  Negative for chest pain, leg swelling and palpitations.  Gastrointestinal:  Negative for abdominal pain, blood in stool, constipation, diarrhea, nausea and vomiting.  Genitourinary:  Negative for hematuria.   Skin: Negative.   Neurological:  Negative for dizziness, headaches and light-headedness.  Hematological:  Does not bruise/bleed easily.      PAST MEDICAL/SURGICAL HISTORY:  Past Medical History:  Diagnosis Date   Chronic pain    right forearm.   Hereditary hemochromatosis (HCC) 08/07/2021   Irritable bowel syndrome (IBS)  2006, 2007   "nervous stomach" abdominal pain   Small bowel obstruction due to adhesions (HCC) 09/02/2013   Past Surgical History:  Procedure Laterality Date   COLONOSCOPY  2006   Carman Ching MD   COLONOSCOPY WITH PROPOFOL N/A 08/11/2021   Procedure: COLONOSCOPY WITH PROPOFOL;  Surgeon: Lanelle Bal, DO;  [facility];  Service: Endoscopy;  Laterality: N/A;  9:00 / ASA II   ESOPHAGOGASTRODUODENOSCOPY  2006   Raymondo Band MD   LAPAROSCOPIC LYSIS OF ADHESIONS N/A 09/02/2013   Procedure: LAPAROSCOPIC LYSIS OF ADHESIONS ;  Surgeon: [name], MD;  Location: [facility] OR;  Service: General;  Laterality: N/A;   SHOULDER ARTHROSCOPY Right 04/2014   Dr. Ranell Patrick   surgery for right forarm trauma     work related machine injury     SOCIAL HISTORY:  Social History   Socioeconomic History   Marital status: Married    Spouse name: Not on file   Number of children: Not on file   Years of education: Not on file   Highest education level: Not on file  Occupational History   Not on file  Tobacco Use   Smoking status: Every Day    Packs/day: 1.00    Years: 38.00    Total pack years: 38.00    Types: Cigarettes   Smokeless tobacco: Never  Vaping Use   Vaping Use: Never used  Substance and Sexual Activity   Alcohol use: No   Drug use: No   Sexual activity: Not on file  Other Topics Concern   Not on  file  Social History Narrative   Not on file   Social Determinants of Health   Financial Resource Strain: Not on file  Food Insecurity: Not on file  Transportation Needs: Not on file  Physical Activity: Not on file  Stress: Not on file  Social Connections: Not on file  Intimate Partner Violence: Not on file    FAMILY HISTORY:  Family History  Problem Relation Age of Onset   Ovarian cancer Sister     CURRENT MEDICATIONS:  Outpatient Encounter Medications as of 06/25/2022  Medication Sig   Aspirin-Acetaminophen-Caffeine (GOODY HEADACHE PO) Take 1 packet by  mouth daily as needed (pain).   CLENPIQ 10-3.5-12 MG-GM -GM/160ML SOLN SMARTSIG:320 Milliliter(s) By Mouth As Directed   meloxicam (MOBIC) 7.5 MG tablet Take 1 tablet (7.5 mg total) by mouth daily.   rosuvastatin (CRESTOR) 5 MG tablet Take 1 tablet (5 mg total) by mouth daily.   No facility-administered encounter medications on file as of 06/25/2022.    ALLERGIES:  No Known Allergies   PHYSICAL EXAM:  ECOG PERFORMANCE STATUS: 0 - Asymptomatic  There were no vitals filed for this visit. There were no vitals filed for this visit. Physical Exam Constitutional:      Appearance: Normal appearance.  HENT:     Head: Normocephalic and atraumatic.     Mouth/Throat:     Mouth: Mucous membranes are moist.  Eyes:     Extraocular Movements: Extraocular movements intact.     Pupils: Pupils are equal, round, and reactive to light.  Cardiovascular:     Rate and Rhythm: Normal rate and regular rhythm.     Pulses: Normal pulses.     Heart sounds: Normal heart sounds.  Pulmonary:     Effort: Pulmonary effort is normal.     Breath sounds: Normal breath sounds.  Abdominal:     General: Bowel sounds are normal.     Palpations: Abdomen is soft.     Tenderness: There is no abdominal tenderness.  Musculoskeletal:        General: No swelling.     Right lower leg: No edema.     Left lower leg: No edema.  Lymphadenopathy:     Cervical: No cervical adenopathy.  Skin:    General: Skin is warm and dry.     Comments: Bronzed color to skin, decreased from previous  Neurological:     General: No focal deficit present.     Mental Status: He is alert and oriented to person, place, and time.  Psychiatric:        Mood and Affect: Mood normal.        Behavior: Behavior normal.      LABORATORY DATA:  I have reviewed the labs as listed.  CBC    Component Value Date/Time   WBC 8.4 05/14/2022 1110   RBC 4.60 05/14/2022 1110   HGB 15.0 05/14/2022 1110   HGB 17.3 05/13/2021 0954   HCT 42.6  05/14/2022 1110   HCT 50.8 05/13/2021 0954   PLT 303 05/14/2022 1110   PLT 325 05/13/2021 0954   MCV 92.6 05/14/2022 1110   MCV 93 05/13/2021 0954   MCH 32.6 05/14/2022 1110   MCHC 35.2 05/14/2022 1110   RDW 11.6 05/14/2022 1110   RDW 12.1 05/13/2021 0954   LYMPHSABS 2.2 02/12/2022 1035   LYMPHSABS 2.0 05/13/2021 0954   MONOABS 0.5 02/12/2022 1035   EOSABS 0.1 02/12/2022 1035   EOSABS 0.2 05/13/2021 0954   BASOSABS 0.1 02/12/2022 1035  BASOSABS 0.1 05/13/2021 0954      Latest Ref Rng & Units 07/23/2021   10:11 AM 05/13/2021    9:54 AM 02/04/2017    4:22 PM  CMP  Glucose 65 - 99 mg/dL  90  95   BUN 6 - 24 mg/dL  18  10   Creatinine 0.76 - 1.27 mg/dL  1.02  0.85   Sodium 134 - 144 mmol/L  138  141   Potassium 3.5 - 5.2 mmol/L  5.2  4.5   Chloride 96 - 106 mmol/L  101  106   CO2 20 - 29 mmol/L  24  23   Calcium 8.7 - 10.2 mg/dL  10.2  9.4   Total Protein 6.5 - 8.1 g/dL 7.3  7.0  6.4   Total Bilirubin 0.3 - 1.2 mg/dL 0.9  0.7  0.3   Alkaline Phos 38 - 126 U/L 75  105  80   AST 15 - 41 U/L 26  28  28    ALT 0 - 44 U/L 32  24  37     DIAGNOSTIC IMAGING:  I have independently reviewed the relevant imaging and discussed with the patient.  ASSESSMENT & PLAN: 1.  Hereditary hemochromatosis (C282Y homozygous) - Severe hepatic iron deposition was incidental finding on recent MRI abdomen (06/11/2021), which was obtained by PCP for monitoring of previously noted hepatic cyst. - MRI abdomen (06/11/2021) showed severe diffuse hepatic iron deposition, as well as tiny < 5 mm cyst which correlates with lesion seen on prior CT; no evidence of hepatic neoplasm. - Hemochromatosis DNA testing shows homozygosity for C282Y HFE gene (07/23/2021).  No abnormalities on hepatic function panel.  Viral hepatitis panel was negative. - Cardiac MRI (09/16/2021) was negative for cardiac hemochromatosis. - At presentation, he had symptomatic iron overload with fatigue, joint pain, and abdominal pain after  eating    - Discussed  course of untreated hereditary hemochromatosis with risk of cirrhosis, hepatocellular carcinoma, heart failure, and arrhythmia.  Iron overload also increases risk of certain bacterial infections. - We have discussed lifestyle modifications such as avoiding alcohol and iron supplementation.  Recent guidelines do not find that moderate red meat intake adversely affects patients with hemochromatosis.  Patient should avoid raw fish or undercooked meat due to risk of certain bacterial infections to thrive and iron rich environments. - We have discussed phlebotomy protocol for treatment of hemochromatosis: Weekly phlebotomy as tolerated until goal ferritin is reached. If Hgb drops < 11.0, we will decrease frequency of phlebotomy. If patient experiences severe side effects or hemodynamic instability, we will decrease amount taken for phlebotomy. Goal is ferritin < 50 for normalization of iron stores. Once iron stores have normalized, we will decrease frequency of phlebotomy. - Initially started on weekly 500 mL phlebotomy, decreased to weekly 250 mL phlebotomy due to significant fatigue after full phlebotomy. - Symptoms today are improved, as he reports decreased fatigue, decreased joint pain, and decreased skin bronzing.  Appetite is improved.  - Most recent labs (06/25/2022): Hgb 17.2/HCT 49.8, ferritin 254, iron saturation 42%.  CMP normal. - PLAN: Weekly phlebotomy (250 mL each) as above.  Monthly CBC.   - Recommend genetic testing of first-degree relatives (parents, siblings, children), as early detection and treatment can help prevent complications. - We will defer GI referral for now, since there were no signs of liver damage apart from iron overload on imaging or labs - Repeat CBC, CMP, and iron panel with RTC in 3 months.   2.  Tobacco abuse - Patient has been smoking 1.0-1.5 PPD since age 42. - This patient meets criteria for low-dose CT lung cancer screening (age 57-80  with a 20+ pack year history, current everyday smoker /OR/ quit < 15 years ago, no current signs or symptoms of lung cancer) - Shared decision making visit completed on 11/06/2021 - Low-dose CT chest (03/05/2022): Pulmonary nodules, lung RADS 2 (benign appearance or behavior); partially calcified bilateral pleural plaques indicative of asbestos-related pleural disease, aortic atherosclerosis, emphysema, coronary artery disease - PLAN: Continue annual CT scans.   - Patient can follow-up with PCP regarding aortic atherosclerosis and coronary artery atherosclerosis (incidental findings). - We will refer to pulmonology due to pleural plaques indicative of asbestos related disease as well as findings consistent with emphysema.     3.  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 - SUBSTANCE: He smokes 1.0-1.5 PPD cigarettes since age 47.  He drinks socially, usually 1 or 2 mixed drinks once or twice a year.  He has a previous history of heavy alcohol use, but quit 26 years ago (age 24).  He denies any illicit drug use. - FAMILY: No family history of liver disease, hemochromatosis, blood disorders, or iron disorders.  Patient's mother had breast cancer.   All questions were answered. The patient knows to call the clinic with any problems, questions or concerns.  Medical decision making: Low   Time spent on visit: I spent 15 minutes counseling the patient face to face. The total time spent in the appointment was 20 minutes and more than 50% was on counseling.   Carnella Guadalajara, PA-C  06/25/2022 11:48 AM

## 2022-06-25 ENCOUNTER — Inpatient Hospital Stay: Payer: Managed Care, Other (non HMO)

## 2022-06-25 ENCOUNTER — Inpatient Hospital Stay (HOSPITAL_BASED_OUTPATIENT_CLINIC_OR_DEPARTMENT_OTHER): Payer: Managed Care, Other (non HMO) | Admitting: Physician Assistant

## 2022-06-25 DIAGNOSIS — J920 Pleural plaque with presence of asbestos: Secondary | ICD-10-CM | POA: Diagnosis not present

## 2022-06-25 LAB — COMPREHENSIVE METABOLIC PANEL
ALT: 15 U/L (ref 0–44)
AST: 16 U/L (ref 15–41)
Albumin: 4.5 g/dL (ref 3.5–5.0)
Alkaline Phosphatase: 86 U/L (ref 38–126)
Anion gap: 9 (ref 5–15)
BUN: 20 mg/dL (ref 6–20)
CO2: 25 mmol/L (ref 22–32)
Calcium: 9.4 mg/dL (ref 8.9–10.3)
Chloride: 103 mmol/L (ref 98–111)
Creatinine, Ser: 0.98 mg/dL (ref 0.61–1.24)
GFR, Estimated: >60 mL/min (ref >60)
Glucose, Bld: 90 mg/dL (ref 70–99)
Potassium: 4.3 mmol/L (ref 3.5–5.1)
Sodium: 137 mmol/L (ref 135–145)
Total Bilirubin: 0.7 mg/dL (ref 0.3–1.2)
Total Protein: 7.9 g/dL (ref 6.5–8.1)

## 2022-06-25 LAB — CBC WITH DIFFERENTIAL/PLATELET
Abs Immature Granulocytes: 0.03 10*3/uL (ref 0.00–0.07)
Basophils Absolute: 0 10*3/uL (ref 0.0–0.1)
Basophils Relative: 1 %
Eosinophils Absolute: 0.1 10*3/uL (ref 0.0–0.5)
Eosinophils Relative: 1 %
HCT: 49.8 % (ref 39.0–52.0)
Hemoglobin: 17.2 g/dL — ABNORMAL HIGH (ref 13.0–17.0)
Immature Granulocytes: 0 %
Lymphocytes Relative: 24 %
Lymphs Abs: 2 10*3/uL (ref 0.7–4.0)
MCH: 32.8 pg (ref 26.0–34.0)
MCHC: 34.5 g/dL (ref 30.0–36.0)
MCV: 95 fL (ref 80.0–100.0)
Monocytes Absolute: 0.7 10*3/uL (ref 0.1–1.0)
Monocytes Relative: 8 %
Neutro Abs: 5.4 10*3/uL (ref 1.7–7.7)
Neutrophils Relative %: 66 %
Platelets: 318 10*3/uL (ref 150–400)
RBC: 5.24 MIL/uL (ref 4.22–5.81)
RDW: 11.2 % — ABNORMAL LOW (ref 11.5–15.5)
WBC: 8.2 10*3/uL (ref 4.0–10.5)
nRBC: 0 % (ref 0.0–0.2)

## 2022-06-25 LAB — IRON AND TIBC
Iron: 118 ug/dL (ref 45–182)
Saturation Ratios: 42 % — ABNORMAL HIGH (ref 17.9–39.5)
TIBC: 281 ug/dL (ref 250–450)
UIBC: 163 ug/dL

## 2022-06-25 LAB — FERRITIN: Ferritin: 254 ng/mL (ref 24–336)

## 2022-06-25 NOTE — Patient Instructions (Signed)
[place] at [facility] Discharge Instructions  You were seen today by Tarri Abernethy PA-C for your hemochromatosis (iron overload).  Your numbers are looking better!  To treat your hemochromatosis, we will continue to take blood from you once per week, but we will continue to monitor your blood counts to make sure that we do not take too much.  LABS: Return in 1 month for repeat labs and blood count  TREATMENT: Weekly phlebotomy (taking blood)  FOLLOW-UP APPOINTMENT: Office visit in 4 months  ** Thank you for trusting me with your healthcare!  I strive to provide all of my patients with quality care at each visit.  If you receive a survey for this visit, I would be so grateful to you for taking the time to provide feedback.  Thank you in advance!  ~ Maze Corniel                   Dr. Derek Jack   &   Tarri Abernethy, PA-C   - - - - - - - - - - - - - - - - - -     Thank you for choosing [place] at [facility] to provide your oncology and hematology care.  To afford each patient quality time with our provider, please arrive at least 15 minutes before your scheduled appointment time.   If you have a lab appointment with the [facility] please come in thru the Main Entrance and check in at the main information desk.  You need to re-schedule your appointment should you arrive 10 or more minutes late.  We strive to give you quality time with our providers, and arriving late affects you and other patients whose appointments are after yours.  Also, if you no show three or more times for appointments you may be dismissed from the clinic at the providers discretion.     Again, thank you for choosing [facility].  Our hope is that these requests will decrease the amount of time that you wait before being seen by our physicians.       _____________________________________________________________  Should you have questions  after your visit to [facility], please contact our office at [phone] and follow the prompts.  Our office hours are 8:00 a.m. and 4:30 p.m. Monday - Friday.  Please note that voicemails left after 4:00 p.m. may not be returned until the following business day.  We are closed weekends and major holidays.  You do have access to a nurse 24-7, just call the main number to the clinic [phone] and do not press any options, hold on the line and a nurse will answer the phone.    For prescription refill requests, have your pharmacy contact our office and allow 72 hours.    Due to Covid, you will need to wear a mask upon entering the hospital. If you do not have a mask, a mask will be given to you at the Main Entrance upon arrival. For doctor visits, patients may have 1 support person age 44 or older with them. For treatment visits, patients can not have anyone with them due to social distancing guidelines and our immunocompromised population.

## 2022-06-25 NOTE — Patient Instructions (Signed)
[facility]  Discharge Instructions: Thank you for choosing [facility] to provide your oncology and hematology care.  If you have a lab appointment with the Cancer Center, please come in thru the Main Entrance and check in at the main information desk.  Wear comfortable clothing and clothing appropriate for easy access to any Portacath or PICC line.   We strive to give you quality time with your provider. You may need to reschedule your appointment if you arrive late (15 or more minutes).  Arriving late affects you and other patients whose appointments are after yours.  Also, if you miss three or more appointments without notifying the office, you may be dismissed from the clinic at the provider's discretion.      For prescription refill requests, have your pharmacy contact our office and allow 72 hours for refills to be completed.     To help prevent nausea and vomiting after your treatment, we encourage you to take your nausea medication as directed.  BELOW ARE SYMPTOMS THAT SHOULD BE REPORTED IMMEDIATELY: *FEVER GREATER THAN 100.4 F (38 C) OR HIGHER *CHILLS OR SWEATING *NAUSEA AND VOMITING THAT IS NOT CONTROLLED WITH YOUR NAUSEA MEDICATION *UNUSUAL SHORTNESS OF BREATH *UNUSUAL BRUISING OR BLEEDING *URINARY PROBLEMS (pain or burning when urinating, or frequent urination) *BOWEL PROBLEMS (unusual diarrhea, constipation, pain near the anus) TENDERNESS IN MOUTH AND THROAT WITH OR WITHOUT PRESENCE OF ULCERS (sore throat, sores in mouth, or a toothache) UNUSUAL RASH, SWELLING OR PAIN  UNUSUAL VAGINAL DISCHARGE OR ITCHING   Items with * indicate a potential emergency and should be followed up as soon as possible or go to the Emergency Department if any problems should occur.  Please show the CHEMOTHERAPY ALERT CARD or IMMUNOTHERAPY ALERT CARD at check-in to the Emergency Department and triage nurse.  Should you have questions after your visit or need to  cancel or reschedule your appointment, please contact [facility] [phone]  and follow the prompts.  Office hours are 8:00 a.m. to 4:30 p.m. Monday - Friday. Please note that voicemails left after 4:00 p.m. may not be returned until the following business day.  We are closed weekends and major holidays. You have access to a nurse at all times for urgent questions. Please call the main number to the clinic [phone] and follow the prompts.  For any non-urgent questions, you may also contact your provider using MyChart. We now offer e-Visits for anyone 18 and older to request care online for non-urgent symptoms. For details visit [email or web address].   Also download the MyChart app! Go to the app store, search "MyChart", open the app, select [place], and log in with your MyChart username and password.  Masks are optional in the cancer centers. If you would like for your care team to wear a mask while they are taking care of you, please let them know. You may have one support person who is at least 59 years old accompany you for your appointments.  

## 2022-06-25 NOTE — Progress Notes (Signed)
Patient presents today for therapeutic phlebotomy.  Patient is in satisfactory condition with no new complaints voiced.  Vital signs are stable.  We will proceed with phlebotomy per provider orders.  Therapeutic phlebotomy started at 1120 and ended at 1128.  250 mLs of blood were drawn from the patient.  Patient tolerated phlebotomy well and vital signs remained stable.  Patient refused to wait the recommended 30 minute post wait time. Patient left ambulatory in stable condition.

## 2022-07-02 ENCOUNTER — Inpatient Hospital Stay: Payer: Managed Care, Other (non HMO)

## 2022-07-02 NOTE — Patient Instructions (Signed)
[facility] Lin  Discharge Instructions: Thank you for choosing [facility] to provide your oncology and hematology care.  If you have a lab appointment with the [facility], please come in thru the Main Entrance and check in at the main information desk.  Wear comfortable clothing and clothing appropriate for easy access to any Portacath or PICC line.   We strive to give you quality time with your provider. You may need to reschedule your appointment if you arrive late (15 or more minutes).  Arriving late affects you and other patients whose appointments are after yours.  Also, if you miss three or more appointments without notifying the office, you may be dismissed from the clinic at the provider's discretion.      For prescription refill requests, have your pharmacy contact our office and allow 72 hours for refills to be completed.    Today you received the following chemotherapy and/or immunotherapy agents Phlebotomy      To help prevent nausea and vomiting after your treatment, we encourage you to take your nausea medication as directed.  BELOW ARE SYMPTOMS THAT SHOULD BE REPORTED IMMEDIATELY: *FEVER GREATER THAN 100.4 F (38 C) OR HIGHER *CHILLS OR SWEATING *NAUSEA AND VOMITING THAT IS NOT CONTROLLED WITH YOUR NAUSEA MEDICATION *UNUSUAL SHORTNESS OF BREATH *UNUSUAL BRUISING OR BLEEDING *URINARY PROBLEMS (pain or burning when urinating, or frequent urination) *BOWEL PROBLEMS (unusual diarrhea, constipation, pain near the anus) TENDERNESS IN MOUTH AND THROAT WITH OR WITHOUT PRESENCE OF ULCERS (sore throat, sores in mouth, or a toothache) UNUSUAL RASH, SWELLING OR PAIN  UNUSUAL VAGINAL DISCHARGE OR ITCHING   Items with * indicate a potential emergency and should be followed up as soon as possible or go to the Emergency Department if any problems should occur.  Please show the CHEMOTHERAPY ALERT CARD or IMMUNOTHERAPY ALERT CARD at check-in to the  Emergency Department and triage nurse.  Should you have questions after your visit or need to cancel or reschedule your appointment, please contact [facility] [phone]  and follow the prompts.  Office hours are 8:00 a.m. to 4:30 p.m. Monday - Friday. Please note that voicemails left after 4:00 p.m. may not be returned until the following business day.  We are closed weekends and major holidays. You have access to a nurse at all times for urgent questions. Please call the main number to the clinic [phone] and follow the prompts.  For any non-urgent questions, you may also contact your provider using MyChart. We now offer e-Visits for anyone 17 and older to request care online for non-urgent symptoms. For details visit [email or web address].   Also download the MyChart app! Go to the app store, search "MyChart", open the app, select [facility], and log in with your MyChart username and password.  Masks are optional in the cancer centers. If you would like for your care team to wear a mask while they are taking care of you, please let them know. You may have one support person who is at least 59 years old accompany you for your appointments.

## 2022-07-02 NOTE — Progress Notes (Signed)
Patient presents today for phlebotomy per MD orders. Phlebotomy procedure started at 1253 and ended at 1300. 250 cc removed. Patient tolerated procedure well. Procedure tolerated well and without incident. Discharged ambulatory in stable condition.

## 2022-07-09 ENCOUNTER — Inpatient Hospital Stay: Payer: Managed Care, Other (non HMO)

## 2022-07-09 ENCOUNTER — Inpatient Hospital Stay: Payer: Managed Care, Other (non HMO) | Attending: Hematology

## 2022-07-09 NOTE — Patient Instructions (Signed)
[facility] [place]  Discharge Instructions: Thank you for choosing [facility] to provide your oncology and hematology care.  If you have a lab appointment with the Cancer Center, please come in thru the Main Entrance and check in at the main information desk.  Wear comfortable clothing and clothing appropriate for easy access to any Portacath or PICC line.   We strive to give you quality time with your provider. You may need to reschedule your appointment if you arrive late (15 or more minutes).  Arriving late affects you and other patients whose appointments are after yours.  Also, if you miss three or more appointments without notifying the office, you may be dismissed from the clinic at the provider's discretion.      For prescription refill requests, have your pharmacy contact our office and allow 72 hours for refills to be completed.     To help prevent nausea and vomiting after your treatment, we encourage you to take your nausea medication as directed.  BELOW ARE SYMPTOMS THAT SHOULD BE REPORTED IMMEDIATELY: *FEVER GREATER THAN 100.4 F (38 C) OR HIGHER *CHILLS OR SWEATING *NAUSEA AND VOMITING THAT IS NOT CONTROLLED WITH YOUR NAUSEA MEDICATION *UNUSUAL SHORTNESS OF BREATH *UNUSUAL BRUISING OR BLEEDING *URINARY PROBLEMS (pain or burning when urinating, or frequent urination) *BOWEL PROBLEMS (unusual diarrhea, constipation, pain near the anus) TENDERNESS IN MOUTH AND THROAT WITH OR WITHOUT PRESENCE OF ULCERS (sore throat, sores in mouth, or a toothache) UNUSUAL RASH, SWELLING OR PAIN  UNUSUAL VAGINAL DISCHARGE OR ITCHING   Items with * indicate a potential emergency and should be followed up as soon as possible or go to the Emergency Department if any problems should occur.  Please show the CHEMOTHERAPY ALERT CARD or IMMUNOTHERAPY ALERT CARD at check-in to the Emergency Department and triage nurse.  Should you have questions after your visit or need to  cancel or reschedule your appointment, please contact [facility] [place] [phone]  and follow the prompts.  Office hours are 8:00 a.m. to 4:30 p.m. Monday - Friday. Please note that voicemails left after 4:00 p.m. may not be returned until the following business day.  We are closed weekends and major holidays. You have access to a nurse at all times for urgent questions. Please call the main number to the clinic [phone] and follow the prompts.  For any non-urgent questions, you may also contact your provider using MyChart. We now offer e-Visits for anyone 18 and older to request care online for non-urgent symptoms. For details visit [email or web address].   Also download the MyChart app! Go to the app store, search "MyChart", open the app, select [place], and log in with your MyChart username and password.  Masks are optional in the cancer centers. If you would like for your care team to wear a mask while they are taking care of you, please let them know. You may have one support person who is at least 59 years old accompany you for your appointments.  

## 2022-07-09 NOTE — Progress Notes (Signed)
Patient presents today for therapeutic phlebotomy.  Patient is in satisfactory condition with no complaints voiced.  Vital signs are stable. We will proceed with phlebotomy per provider orders.  Therapeutic phlebotomy started at 1302 and ended at 1307.  250 mL of blood removed from patient.  Patient tolerated phlebotomy well with no complaints voiced. Vitals remained stable. Patient refused to wait the recommended 30 minute post phlebotomy wait time.  Patient left ambulatory in stable condition.

## 2022-07-16 ENCOUNTER — Inpatient Hospital Stay: Payer: Managed Care, Other (non HMO)

## 2022-07-16 NOTE — Patient Instructions (Signed)
[facility] [place]  Discharge Instructions: Thank you for choosing [facility] to provide your oncology and hematology care.  If you have a lab appointment with the Cancer Center, please come in thru the Main Entrance and check in at the main information desk.  Wear comfortable clothing and clothing appropriate for easy access to any Portacath or PICC line.   We strive to give you quality time with your provider. You may need to reschedule your appointment if you arrive late (15 or more minutes).  Arriving late affects you and other patients whose appointments are after yours.  Also, if you miss three or more appointments without notifying the office, you may be dismissed from the clinic at the provider's discretion.      For prescription refill requests, have your pharmacy contact our office and allow 72 hours for refills to be completed.    Today you received the following chemotherapy and/or immunotherapy agents phlebotomy      To help prevent nausea and vomiting after your treatment, we encourage you to take your nausea medication as directed.  BELOW ARE SYMPTOMS THAT SHOULD BE REPORTED IMMEDIATELY: *FEVER GREATER THAN 100.4 F (38 C) OR HIGHER *CHILLS OR SWEATING *NAUSEA AND VOMITING THAT IS NOT CONTROLLED WITH YOUR NAUSEA MEDICATION *UNUSUAL SHORTNESS OF BREATH *UNUSUAL BRUISING OR BLEEDING *URINARY PROBLEMS (pain or burning when urinating, or frequent urination) *BOWEL PROBLEMS (unusual diarrhea, constipation, pain near the anus) TENDERNESS IN MOUTH AND THROAT WITH OR WITHOUT PRESENCE OF ULCERS (sore throat, sores in mouth, or a toothache) UNUSUAL RASH, SWELLING OR PAIN  UNUSUAL VAGINAL DISCHARGE OR ITCHING   Items with * indicate a potential emergency and should be followed up as soon as possible or go to the Emergency Department if any problems should occur.  Please show the CHEMOTHERAPY ALERT CARD or IMMUNOTHERAPY ALERT CARD at check-in to the  Emergency Department and triage nurse.  Should you have questions after your visit or need to cancel or reschedule your appointment, please contact [facility] [place] [phone]  and follow the prompts.  Office hours are 8:00 a.m. to 4:30 p.m. Monday - Friday. Please note that voicemails left after 4:00 p.m. may not be returned until the following business day.  We are closed weekends and major holidays. You have access to a nurse at all times for urgent questions. Please call the main number to the clinic [phone] and follow the prompts.  For any non-urgent questions, you may also contact your provider using MyChart. We now offer e-Visits for anyone 18 and older to request care online for non-urgent symptoms. For details visit [email or web address].   Also download the MyChart app! Go to the app store, search "MyChart", open the app, select [place], and log in with your MyChart username and password.  Masks are optional in the cancer centers. If you would like for your care team to wear a mask while they are taking care of you, please let them know. You may have one support person who is at least 59 years old accompany you for your appointments.  

## 2022-07-16 NOTE — Progress Notes (Signed)
Patient presents today for phlebotomy per MD orders. Phlebotomy procedure started at 1224 and ended at 1228. 500 cc removed. Patient tolerated procedure well. Procedure tolerated well and without incident. Discharged ambulatory in stable condition.

## 2022-07-23 ENCOUNTER — Inpatient Hospital Stay: Payer: Managed Care, Other (non HMO)

## 2022-07-23 LAB — CBC
HCT: 45.7 % (ref 39.0–52.0)
Hemoglobin: 15.7 g/dL (ref 13.0–17.0)
MCH: 31.8 pg (ref 26.0–34.0)
MCHC: 34.4 g/dL (ref 30.0–36.0)
MCV: 92.7 fL (ref 80.0–100.0)
Platelets: 305 10*3/uL (ref 150–400)
RBC: 4.93 MIL/uL (ref 4.22–5.81)
RDW: 11.5 % (ref 11.5–15.5)
WBC: 9.7 10*3/uL (ref 4.0–10.5)
nRBC: 0 % (ref 0.0–0.2)

## 2022-07-23 NOTE — Patient Instructions (Signed)
[facility]  Discharge Instructions: Thank you for choosing [facility] to provide your oncology and hematology care.  If you have a lab appointment with the Cancer Center, please come in thru the Main Entrance and check in at the main information desk.  Wear comfortable clothing and clothing appropriate for easy access to any Portacath or PICC line.   We strive to give you quality time with your provider. You may need to reschedule your appointment if you arrive late (15 or more minutes).  Arriving late affects you and other patients whose appointments are after yours.  Also, if you miss three or more appointments without notifying the office, you may be dismissed from the clinic at the provider's discretion.      For prescription refill requests, have your pharmacy contact our office and allow 72 hours for refills to be completed.    Therapeutic Phlebotomy, Care After The following information offers guidance on how to care for yourself after your procedure. Your health care provider may also give you more specific instructions. If you have problems or questions, contact your health care provider. What can I expect after the procedure? After therapeutic phlebotomy, it is common to have: Light-headedness or dizziness. You may feel faint. Nausea. Tiredness (fatigue). Follow these instructions at home: Eating and drinking Be sure to eat well-balanced meals for the next 24 hours. Drink enough fluid to keep your urine pale yellow. Avoid drinking alcohol on the day that you had the procedure. Activity  Return to your normal activities as told by your health care provider. Most people can go back to their normal activities right away. Avoid activities that take a lot of effort for about 5 hours after the procedure. Athletes should avoid strenuous exercise for at least 12 hours. Avoid heavy lifting or pulling for about 5 hours after the procedure. Do not lift  anything that is heavier than 10 lb (4.5 kg). Change positions slowly for the remainder of the day, like from sitting to standing. This can help prevent light-headedness or fainting. If you feel light-headed, lie down until the feeling goes away. Needle insertion site care  Keep your bandage (dressing) dry. You can remove the bandage after about 5 hours or as told by your health care provider. If you have bleeding from the needle insertion site, raise (elevate) your arm and press firmly on the site until the bleeding stops. If you have bruising at the site, apply ice to the area. To do this: Put ice in a plastic bag. Place a towel between your skin and the bag. Leave the ice on for 20 minutes, 2-3 times a day for the first 24 hours. Remove the ice if your skin turns bright red so you do not damage the area. If the swelling does not go away after 24 hours, apply a warm, moist cloth (warm compress) to the area for 20 minutes, 2-3 times a day. General instructions Do not use any products that contain nicotine or tobacco, like cigarettes, chewing tobacco, and vaping devices, such as e-cigarettes, for at least 30 minutes after the procedure. If you need help quitting, ask your health care provider. Keep all follow-up visits. You may need to continue having regular blood tests and therapeutic phlebotomy treatments as directed. Contact a health care provider if: You have redness, swelling, or pain at the needle insertion site. Fluid or blood is coming from the needle insertion site. Pus or a bad smell is coming from  the needle insertion site. The needle insertion site feels warm to the touch. You feel light-headed, dizzy, or nauseous, and the feeling does not go away. You have new bruising at the needle insertion site. You feel weaker than normal. You have a fever or chills. Get help right away if: You have chest pain. You have trouble breathing. You have severe nausea or  vomiting. Summary After the procedure, it is common to have some light-headedness, dizziness, nausea, or tiredness (fatigue). Be sure to eat well-balanced meals for the next 24 hours. Drink enough fluid to keep your urine pale yellow. Return to your normal activities as told by your health care provider. Keep all follow-up visits. You may need to continue having regular blood tests and therapeutic phlebotomy treatments as directed. This information is not intended to replace advice given to you by your health care provider. Make sure you discuss any questions you have with your health care provider. Document Revised: 03/18/2021 Document Reviewed: 03/18/2021 Elsevier Patient Education  [place].     To help prevent nausea and vomiting after your treatment, we encourage you to take your nausea medication as directed.  BELOW ARE SYMPTOMS THAT SHOULD BE REPORTED IMMEDIATELY: *FEVER GREATER THAN 100.4 F (38 C) OR HIGHER *CHILLS OR SWEATING *NAUSEA AND VOMITING THAT IS NOT CONTROLLED WITH YOUR NAUSEA MEDICATION *UNUSUAL SHORTNESS OF BREATH *UNUSUAL BRUISING OR BLEEDING *URINARY PROBLEMS (pain or burning when urinating, or frequent urination) *BOWEL PROBLEMS (unusual diarrhea, constipation, pain near the anus) TENDERNESS IN MOUTH AND THROAT WITH OR WITHOUT PRESENCE OF ULCERS (sore throat, sores in mouth, or a toothache) UNUSUAL RASH, SWELLING OR PAIN  UNUSUAL VAGINAL DISCHARGE OR ITCHING   Items with * indicate a potential emergency and should be followed up as soon as possible or go to the Emergency Department if any problems should occur.  Please show the CHEMOTHERAPY ALERT CARD or IMMUNOTHERAPY ALERT CARD at check-in to the Emergency Department and triage nurse.  Should you have questions after your visit or need to cancel or reschedule your appointment, please contact [facility] [phone]  and follow the prompts.  Office hours are 8:00 a.m. to 4:30 p.m.  Monday - Friday. Please note that voicemails left after 4:00 p.m. may not be returned until the following business day.  We are closed weekends and major holidays. You have access to a nurse at all times for urgent questions. Please call the main number to the clinic [phone] and follow the prompts.  For any non-urgent questions, you may also contact your provider using MyChart. We now offer e-Visits for anyone 26 and older to request care online for non-urgent symptoms. For details visit [email or web address].   Also download the MyChart app! Go to the app store, search "MyChart", open the app, select [place], and log in with your MyChart username and password.  Masks are optional in the cancer centers. If you would like for your care team to wear a mask while they are taking care of you, please let them know. You may have one support person who is at least 59 years old accompany you for your appointments.

## 2022-07-23 NOTE — Progress Notes (Signed)
Eric Form presents today for phlebotomy per MD orders. Phlebotomy procedure started at 1126 and ended at 1129. 250 cc removed. Patient tolerated procedure well. IV needle removed intact.   Patient tolerated phlebotomy with no complaints voiced.  Peripheral IV site intact with no bruising or swelling noted.  Denied SOB, chest pain, or dizziness.  Gauze with coban applied.  Discharged with VSS and no s/s of distress noted.

## 2022-07-30 ENCOUNTER — Inpatient Hospital Stay: Payer: Managed Care, Other (non HMO)

## 2022-07-30 NOTE — Patient Instructions (Signed)
[facility] [place]  Discharge Instructions: Thank you for choosing [facility] to provide your oncology and hematology care.  If you have a lab appointment with the Cancer Center, please come in thru the Main Entrance and check in at the main information desk.  Wear comfortable clothing and clothing appropriate for easy access to any Portacath or PICC line.   We strive to give you quality time with your provider. You may need to reschedule your appointment if you arrive late (15 or more minutes).  Arriving late affects you and other patients whose appointments are after yours.  Also, if you miss three or more appointments without notifying the office, you may be dismissed from the clinic at the provider's discretion.      For prescription refill requests, have your pharmacy contact our office and allow 72 hours for refills to be completed.    Today you received the following chemotherapy and/or immunotherapy agents phlebotomy. Therapeutic Phlebotomy, Care After The following information offers guidance on how to care for yourself after your procedure. Your health care provider may also give you more specific instructions. If you have problems or questions, contact your health care provider. What can I expect after the procedure? After therapeutic phlebotomy, it is common to have: Light-headedness or dizziness. You may feel faint. Nausea. Tiredness (fatigue). Follow these instructions at home: Eating and drinking Be sure to eat well-balanced meals for the next 24 hours. Drink enough fluid to keep your urine pale yellow. Avoid drinking alcohol on the day that you had the procedure. Activity  Return to your normal activities as told by your health care provider. Most people can go back to their normal activities right away. Avoid activities that take a lot of effort for about 5 hours after the procedure. Athletes should avoid strenuous exercise for at least 12  hours. Avoid heavy lifting or pulling for about 5 hours after the procedure. Do not lift anything that is heavier than 10 lb (4.5 kg). Change positions slowly for the remainder of the day, like from sitting to standing. This can help prevent light-headedness or fainting. If you feel light-headed, lie down until the feeling goes away. Needle insertion site care  Keep your bandage (dressing) dry. You can remove the bandage after about 5 hours or as told by your health care provider. If you have bleeding from the needle insertion site, raise (elevate) your arm and press firmly on the site until the bleeding stops. If you have bruising at the site, apply ice to the area. To do this: Put ice in a plastic bag. Place a towel between your skin and the bag. Leave the ice on for 20 minutes, 2-3 times a day for the first 24 hours. Remove the ice if your skin turns bright red so you do not damage the area. If the swelling does not go away after 24 hours, apply a warm, moist cloth (warm compress) to the area for 20 minutes, 2-3 times a day. General instructions Do not use any products that contain nicotine or tobacco, like cigarettes, chewing tobacco, and vaping devices, such as e-cigarettes, for at least 30 minutes after the procedure. If you need help quitting, ask your health care provider. Keep all follow-up visits. You may need to continue having regular blood tests and therapeutic phlebotomy treatments as directed. Contact a health care provider if: You have redness, swelling, or pain at the needle insertion site. Fluid or blood is coming from the needle   insertion site. Pus or a bad smell is coming from the needle insertion site. The needle insertion site feels warm to the touch. You feel light-headed, dizzy, or nauseous, and the feeling does not go away. You have new bruising at the needle insertion site. You feel weaker than normal. You have a fever or chills. Get help right away if: You have  chest pain. You have trouble breathing. You have severe nausea or vomiting. Summary After the procedure, it is common to have some light-headedness, dizziness, nausea, or tiredness (fatigue). Be sure to eat well-balanced meals for the next 24 hours. Drink enough fluid to keep your urine pale yellow. Return to your normal activities as told by your health care provider. Keep all follow-up visits. You may need to continue having regular blood tests and therapeutic phlebotomy treatments as directed. This information is not intended to replace advice given to you by your health care provider. Make sure you discuss any questions you have with your health care provider. Document Revised: 03/18/2021 Document Reviewed: 03/18/2021 Elsevier Patient Education  2023 Else[employer] Inc.       To help prevent nausea and vomiting after your treatment, we encourage you to take your nausea medication as directed.  BELOW ARE SYMPTOMS THAT SHOULD BE REPORTED IMMEDIATELY: *FEVER GREATER THAN 100.4 F (38 C) OR HIGHER *CHILLS OR SWEATING *NAUSEA AND VOMITING THAT IS NOT CONTROLLED WITH YOUR NAUSEA MEDICATION *UNUSUAL SHORTNESS OF BREATH *UNUSUAL BRUISING OR BLEEDING *URINARY PROBLEMS (pain or burning when urinating, or frequent urination) *BOWEL PROBLEMS (unusual diarrhea, constipation, pain near the anus) TENDERNESS IN MOUTH AND THROAT WITH OR WITHOUT PRESENCE OF ULCERS (sore throat, sores in mouth, or a toothache) UNUSUAL RASH, SWELLING OR PAIN  UNUSUAL VAGINAL DISCHARGE OR ITCHING   Items with * indicate a potential emergency and should be followed up as soon as possible or go to the Emergency Department if any problems should occur.  Please show the CHEMOTHERAPY ALERT CARD or IMMUNOTHERAPY ALERT CARD at check-in to the Emergency Department and triage nurse.  Should you have questions after your visit or need to cancel or reschedule your appointment, please contact [facility] [place]  [phone]  and follow the prompts.  Office hours are 8:00 a.m. to 4:30 p.m. Monday - Friday. Please note that voicemails left after 4:00 p.m. may not be returned until the following business day.  We are closed weekends and major holidays. You have access to a nurse at all times for urgent questions. Please call the main number to the clinic [phone] and follow the prompts.  For any non-urgent questions, you may also contact your provider using MyChart. We now offer e-Visits for anyone 18 and older to request care online for non-urgent symptoms. For details visit [email or web address].   Also download the MyChart app! Go to the app store, search "MyChart", open the app, select [place], and log in with your MyChart username and password.  Masks are optional in the cancer centers. If you would like for your care team to wear a mask while they are taking care of you, please let them know. You may have one support person who is at least 59 years old accompany you for your appointments.  

## 2022-07-30 NOTE — Progress Notes (Signed)
Garrett Lin presents for therapeutic phlebotomy per MD orders. Last HGB 15.7 / HCT 45.7 on 07-23-2022 . Vital signs stable prior to procedure. Procedure started at 08:47am and ended at 08:49am. 250 mls of blood removed. Patient denies any dizziness , lightheadedness, or feeling faint.  Gauze and coban applied to site. Vital signs stable at completion of procedure. Patient has no complaints at this time. Alert and oriented x 3. Discharged in stable condition.    Patient declined 30 minute post phlebotomy monitoring.

## 2022-08-05 ENCOUNTER — Inpatient Hospital Stay: Payer: Managed Care, Other (non HMO) | Attending: Hematology

## 2022-08-05 NOTE — Progress Notes (Signed)
Garrett Lin presents today for phlebotomy per MD orders. Phlebotomy procedure started at 1436 and ended at 1439. 500 mls removed. Patient did not want to stay his wait time.  Patient tolerated procedure well. IV needle removed intact.  Vitals stable and discharged home from clinic ambulatory. Follow up as scheduled.

## 2022-08-05 NOTE — Patient Instructions (Signed)
[facility]  Discharge Instructions: Thank you for choosing [facility] to provide your oncology and hematology care.  If you have a lab appointment with the [facility], please come in thru the Main Entrance and check in at the main information desk.  Wear comfortable clothing and clothing appropriate for easy access to any Portacath or PICC line.   We strive to give you quality time with your provider. You may need to reschedule your appointment if you arrive late (15 or more minutes).  Arriving late affects you and other patients whose appointments are after yours.  Also, if you miss three or more appointments without notifying the office, you may be dismissed from the clinic at the provider's discretion.      For prescription refill requests, have your pharmacy contact our office and allow 72 hours for refills to be completed.    Phlebotomy done today per orders.    To help prevent nausea and vomiting after your treatment, we encourage you to take your nausea medication as directed.  BELOW ARE SYMPTOMS THAT SHOULD BE REPORTED IMMEDIATELY: *FEVER GREATER THAN 100.4 F (38 C) OR HIGHER *CHILLS OR SWEATING *NAUSEA AND VOMITING THAT IS NOT CONTROLLED WITH YOUR NAUSEA MEDICATION *UNUSUAL SHORTNESS OF BREATH *UNUSUAL BRUISING OR BLEEDING *URINARY PROBLEMS (pain or burning when urinating, or frequent urination) *BOWEL PROBLEMS (unusual diarrhea, constipation, pain near the anus) TENDERNESS IN MOUTH AND THROAT WITH OR WITHOUT PRESENCE OF ULCERS (sore throat, sores in mouth, or a toothache) UNUSUAL RASH, SWELLING OR PAIN  UNUSUAL VAGINAL DISCHARGE OR ITCHING   Items with * indicate a potential emergency and should be followed up as soon as possible or go to the Emergency Department if any problems should occur.  Please show the CHEMOTHERAPY ALERT CARD or IMMUNOTHERAPY ALERT CARD at check-in to the Emergency Department and triage nurse.  Should you have  questions after your visit or need to cancel or reschedule your appointment, please contact [facility] [phone]  and follow the prompts.  Office hours are 8:00 a.m. to 4:30 p.m. Monday - Friday. Please note that voicemails left after 4:00 p.m. may not be returned until the following business day.  We are closed weekends and major holidays. You have access to a nurse at all times for urgent questions. Please call the main number to the clinic [phone] and follow the prompts.  For any non-urgent questions, you may also contact your provider using MyChart. We now offer e-Visits for anyone 66 and older to request care online for non-urgent symptoms. For details visit [email or web address].   Also download the MyChart app! Go to the app store, search "MyChart", open the app, select [facility], and log in with your MyChart username and password.  Masks are optional in the cancer centers. If you would like for your care team to wear a mask while they are taking care of you, please let them know. You may have one support person who is at least 59 years old accompany you for your appointments.

## 2022-08-06 ENCOUNTER — Inpatient Hospital Stay: Payer: Managed Care, Other (non HMO)

## 2022-08-13 ENCOUNTER — Inpatient Hospital Stay: Payer: Managed Care, Other (non HMO)

## 2022-08-13 NOTE — Progress Notes (Signed)
Patient presents today for therapeutic phlebotomy.  Patient is in satisfactory condition with no complaints voiced.  Vital signs are stable.  We will proceed with phlebotomy per provider orders.  Therapeutic phlebotomy started at 1105 and ended at 1116.  Vitals remained stable.  250 mL of blood removed.  Patient denies dizziness or lightheadedness.  Patient refused to wait his recommended 30 minute post phlebotomy wait time.  Patient left ambulatory in stable condition.

## 2022-08-13 NOTE — Patient Instructions (Signed)
[facility]  Discharge Instructions: Thank you for choosing [facility] to provide your oncology and hematology care.  If you have a lab appointment with the Cancer Center, please come in thru the Main Entrance and check in at the main information desk.  Wear comfortable clothing and clothing appropriate for easy access to any Portacath or PICC line.   We strive to give you quality time with your provider. You may need to reschedule your appointment if you arrive late (15 or more minutes).  Arriving late affects you and other patients whose appointments are after yours.  Also, if you miss three or more appointments without notifying the office, you may be dismissed from the clinic at the provider's discretion.      For prescription refill requests, have your pharmacy contact our office and allow 72 hours for refills to be completed.     To help prevent nausea and vomiting after your treatment, we encourage you to take your nausea medication as directed.  BELOW ARE SYMPTOMS THAT SHOULD BE REPORTED IMMEDIATELY: *FEVER GREATER THAN 100.4 F (38 C) OR HIGHER *CHILLS OR SWEATING *NAUSEA AND VOMITING THAT IS NOT CONTROLLED WITH YOUR NAUSEA MEDICATION *UNUSUAL SHORTNESS OF BREATH *UNUSUAL BRUISING OR BLEEDING *URINARY PROBLEMS (pain or burning when urinating, or frequent urination) *BOWEL PROBLEMS (unusual diarrhea, constipation, pain near the anus) TENDERNESS IN MOUTH AND THROAT WITH OR WITHOUT PRESENCE OF ULCERS (sore throat, sores in mouth, or a toothache) UNUSUAL RASH, SWELLING OR PAIN  UNUSUAL VAGINAL DISCHARGE OR ITCHING   Items with * indicate a potential emergency and should be followed up as soon as possible or go to the Emergency Department if any problems should occur.  Please show the CHEMOTHERAPY ALERT CARD or IMMUNOTHERAPY ALERT CARD at check-in to the Emergency Department and triage nurse.  Should you have questions after your visit or need to  cancel or reschedule your appointment, please contact [facility] [phone]  and follow the prompts.  Office hours are 8:00 a.m. to 4:30 p.m. Monday - Friday. Please note that voicemails left after 4:00 p.m. may not be returned until the following business day.  We are closed weekends and major holidays. You have access to a nurse at all times for urgent questions. Please call the main number to the clinic [phone] and follow the prompts.  For any non-urgent questions, you may also contact your provider using MyChart. We now offer e-Visits for anyone 18 and older to request care online for non-urgent symptoms. For details visit [email or web address].   Also download the MyChart app! Go to the app store, search "MyChart", open the app, select [facility], and log in with your MyChart username and password.  Masks are optional in the cancer centers. If you would like for your care team to wear a mask while they are taking care of you, please let them know. You may have one support person who is at least 59 years old accompany you for your appointments.  

## 2022-08-18 ENCOUNTER — Inpatient Hospital Stay: Payer: Managed Care, Other (non HMO)

## 2022-08-18 LAB — CBC
HCT: 44.6 % (ref 39.0–52.0)
Hemoglobin: 15.4 g/dL (ref 13.0–17.0)
MCH: 32.3 pg (ref 26.0–34.0)
MCHC: 34.5 g/dL (ref 30.0–36.0)
MCV: 93.5 fL (ref 80.0–100.0)
Platelets: 319 10*3/uL (ref 150–400)
RBC: 4.77 MIL/uL (ref 4.22–5.81)
RDW: 11.6 % (ref 11.5–15.5)
WBC: 9.2 10*3/uL (ref 4.0–10.5)
nRBC: 0 % (ref 0.0–0.2)

## 2022-08-18 NOTE — Patient Instructions (Signed)
[facility]  Discharge Instructions: Thank you for choosing [facility] to provide your oncology and hematology care.  If you have a lab appointment with the Cancer Center, please come in thru the Main Entrance and check in at the main information desk.  Wear comfortable clothing and clothing appropriate for easy access to any Portacath or PICC line.   We strive to give you quality time with your provider. You may need to reschedule your appointment if you arrive late (15 or more minutes).  Arriving late affects you and other patients whose appointments are after yours.  Also, if you miss three or more appointments without notifying the office, you may be dismissed from the clinic at the provider's discretion.      For prescription refill requests, have your pharmacy contact our office and allow 72 hours for refills to be completed.    Today you received therapeutic phlebotomy     BELOW ARE SYMPTOMS THAT SHOULD BE REPORTED IMMEDIATELY: *FEVER GREATER THAN 100.4 F (38 C) OR HIGHER *CHILLS OR SWEATING *NAUSEA AND VOMITING THAT IS NOT CONTROLLED WITH YOUR NAUSEA MEDICATION *UNUSUAL SHORTNESS OF BREATH *UNUSUAL BRUISING OR BLEEDING *URINARY PROBLEMS (pain or burning when urinating, or frequent urination) *BOWEL PROBLEMS (unusual diarrhea, constipation, pain near the anus) TENDERNESS IN MOUTH AND THROAT WITH OR WITHOUT PRESENCE OF ULCERS (sore throat, sores in mouth, or a toothache) UNUSUAL RASH, SWELLING OR PAIN  UNUSUAL VAGINAL DISCHARGE OR ITCHING   Items with * indicate a potential emergency and should be followed up as soon as possible or go to the Emergency Department if any problems should occur.  Please show the CHEMOTHERAPY ALERT CARD or IMMUNOTHERAPY ALERT CARD at check-in to the Emergency Department and triage nurse.  Should you have questions after your visit or need to cancel or reschedule your appointment, please contact [facility] [phone]  and follow the prompts.  Office hours are 8:00 a.m. to 4:30 p.m. Monday - Friday. Please note that voicemails left after 4:00 p.m. may not be returned until the following business day.  We are closed weekends and major holidays. You have access to a nurse at all times for urgent questions. Please call the main number to the clinic [phone] and follow the prompts.  For any non-urgent questions, you may also contact your provider using MyChart. We now offer e-Visits for anyone 18 and older to request care online for non-urgent symptoms. For details visit [email or web address].   Also download the MyChart app! Go to the app store, search "MyChart", open the app, select [facility], and log in with your MyChart username and password.  Masks are optional in the cancer centers. If you would like for your care team to wear a mask while they are taking care of you, please let them know. You may have one support person who is at least 59 years old accompany you for your appointments.  

## 2022-08-18 NOTE — Progress Notes (Signed)
Garrett Lin presents today for theraputic phlebotomy per MD orders. Last hgb 15.4/hct 44.6 was on 08/18/22. VSS prior to procedure. Pt reports eating before arrival. Procedure started at 1439 using patients right AC 250 mL of blood removed. Procedure ended at 1440. Gauze and coban applied to [name], site clean and dry. VSS upon completion of procedure. Pt denies dizziness, lightheadedness, or feeling faint.  Discharged in satisfactory condition with follow up instructions.

## 2022-08-19 ENCOUNTER — Inpatient Hospital Stay: Payer: Managed Care, Other (non HMO)

## 2022-08-20 ENCOUNTER — Inpatient Hospital Stay: Payer: Managed Care, Other (non HMO)

## 2022-09-01 ENCOUNTER — Inpatient Hospital Stay: Payer: Managed Care, Other (non HMO)

## 2022-09-01 NOTE — Progress Notes (Signed)
Garrett Lin presents for therapeutic phlebotomy per MD orders. Last HGB 15.4 / HCT 44.6 on 08-18-2022 . Vital signs stable prior to procedure. Procedure started at 14:25 pm and ended at 14:27 pm. 250 mls of blood removed. Patient denies any dizziness , lightheadedness, or feeling faint.  Gauze and coban applied to site. Vital signs stable at completion of procedure. Patient has no complaints at this time. Alert and oriented x 3. Discharged in stable condition.

## 2022-09-01 NOTE — Patient Instructions (Signed)
[facility]  Discharge Instructions: Thank you for choosing [facility] to provide your oncology and hematology care.  If you have a lab appointment with the Cancer Center, please come in thru the Main Entrance and check in at the main information desk.  Wear comfortable clothing and clothing appropriate for easy access to any Portacath or PICC line.   We strive to give you quality time with your provider. You may need to reschedule your appointment if you arrive late (15 or more minutes).  Arriving late affects you and other patients whose appointments are after yours.  Also, if you miss three or more appointments without notifying the office, you may be dismissed from the clinic at the provider's discretion.      For prescription refill requests, have your pharmacy contact our office and allow 72 hours for refills to be completed.    Today you received the following chemotherapy and/or immunotherapy agents Phlebotomy.       To help prevent nausea and vomiting after your treatment, we encourage you to take your nausea medication as directed.  BELOW ARE SYMPTOMS THAT SHOULD BE REPORTED IMMEDIATELY: *FEVER GREATER THAN 100.4 F (38 C) OR HIGHER *CHILLS OR SWEATING *NAUSEA AND VOMITING THAT IS NOT CONTROLLED WITH YOUR NAUSEA MEDICATION *UNUSUAL SHORTNESS OF BREATH *UNUSUAL BRUISING OR BLEEDING *URINARY PROBLEMS (pain or burning when urinating, or frequent urination) *BOWEL PROBLEMS (unusual diarrhea, constipation, pain near the anus) TENDERNESS IN MOUTH AND THROAT WITH OR WITHOUT PRESENCE OF ULCERS (sore throat, sores in mouth, or a toothache) UNUSUAL RASH, SWELLING OR PAIN  UNUSUAL VAGINAL DISCHARGE OR ITCHING   Items with * indicate a potential emergency and should be followed up as soon as possible or go to the Emergency Department if any problems should occur.  Please show the CHEMOTHERAPY ALERT CARD or IMMUNOTHERAPY ALERT CARD at check-in to the  Emergency Department and triage nurse.  Should you have questions after your visit or need to cancel or reschedule your appointment, please contact [facility] [phone]  and follow the prompts.  Office hours are 8:00 a.m. to 4:30 p.m. Monday - Friday. Please note that voicemails left after 4:00 p.m. may not be returned until the following business day.  We are closed weekends and major holidays. You have access to a nurse at all times for urgent questions. Please call the main number to the clinic [phone] and follow the prompts.  For any non-urgent questions, you may also contact your provider using MyChart. We now offer e-Visits for anyone 81 and older to request care online for non-urgent symptoms. For details visit [email or web address].   Also download the MyChart app! Go to the app store, search "MyChart", open the app, select [facility], and log in with your MyChart username and password.  Masks are optional in the cancer centers. If you would like for your care team to wear a mask while they are taking care of you, please let them know. You may have one support person who is at least 59 years old accompany you for your appointments.

## 2022-09-03 ENCOUNTER — Inpatient Hospital Stay: Payer: Managed Care, Other (non HMO)

## 2022-09-10 ENCOUNTER — Inpatient Hospital Stay: Payer: Managed Care, Other (non HMO) | Attending: Hematology

## 2022-09-10 NOTE — Progress Notes (Signed)
Patient presents today for phlebotomy per MD orders. Phlebotomy procedure started at 1058 and ended at 1102. 500 cc removed. Patient tolerated procedure well. Procedure tolerated well and without incident. Discharged ambulatory in stable condition.

## 2022-09-10 NOTE — Patient Instructions (Signed)
[facility]  Discharge Instructions: Thank you for choosing [facility] to provide your oncology and hematology care.  If you have a lab appointment with the Cancer Center, please come in thru the Main Entrance and check in at the main information desk.  Wear comfortable clothing and clothing appropriate for easy access to any Portacath or PICC line.   We strive to give you quality time with your provider. You may need to reschedule your appointment if you arrive late (15 or more minutes).  Arriving late affects you and other patients whose appointments are after yours.  Also, if you miss three or more appointments without notifying the office, you may be dismissed from the clinic at the provider's discretion.      For prescription refill requests, have your pharmacy contact our office and allow 72 hours for refills to be completed.    Today you received the following chemotherapy and/or immunotherapy agents phlebotomy      To help prevent nausea and vomiting after your treatment, we encourage you to take your nausea medication as directed.  BELOW ARE SYMPTOMS THAT SHOULD BE REPORTED IMMEDIATELY: *FEVER GREATER THAN 100.4 F (38 C) OR HIGHER *CHILLS OR SWEATING *NAUSEA AND VOMITING THAT IS NOT CONTROLLED WITH YOUR NAUSEA MEDICATION *UNUSUAL SHORTNESS OF BREATH *UNUSUAL BRUISING OR BLEEDING *URINARY PROBLEMS (pain or burning when urinating, or frequent urination) *BOWEL PROBLEMS (unusual diarrhea, constipation, pain near the anus) TENDERNESS IN MOUTH AND THROAT WITH OR WITHOUT PRESENCE OF ULCERS (sore throat, sores in mouth, or a toothache) UNUSUAL RASH, SWELLING OR PAIN  UNUSUAL VAGINAL DISCHARGE OR ITCHING   Items with * indicate a potential emergency and should be followed up as soon as possible or go to the Emergency Department if any problems should occur.  Please show the CHEMOTHERAPY ALERT CARD or IMMUNOTHERAPY ALERT CARD at check-in to the  Emergency Department and triage nurse.  Should you have questions after your visit or need to cancel or reschedule your appointment, please contact [facility] [phone]  and follow the prompts.  Office hours are 8:00 a.m. to 4:30 p.m. Monday - Friday. Please note that voicemails left after 4:00 p.m. may not be returned until the following business day.  We are closed weekends and major holidays. You have access to a nurse at all times for urgent questions. Please call the main number to the clinic [phone] and follow the prompts.  For any non-urgent questions, you may also contact your provider using MyChart. We now offer e-Visits for anyone 18 and older to request care online for non-urgent symptoms. For details visit [email or web address].   Also download the MyChart app! Go to the app store, search "MyChart", open the app, select [facility], and log in with your MyChart username and password.  Masks are optional in the cancer centers. If you would like for your care team to wear a mask while they are taking care of you, please let them know. You may have one support person who is at least 59 years old accompany you for your appointments.  

## 2022-09-16 ENCOUNTER — Inpatient Hospital Stay: Payer: Managed Care, Other (non HMO)

## 2022-09-16 NOTE — Progress Notes (Signed)
Garrett Lin presents for therapeutic phlebotomy per MD orders. Last HGB 15.4 / HCT 44.6 on 08/18/2022 . Vital signs stable prior to procedure. Procedure started at 14:12 pm and ended at 14:15pm. 250 mls of blood removed. Patient denies any dizziness , lightheadedness, or feeling faint.  Gauze and coban applied to site. Vital signs stable at completion of procedure. Patient has no complaints at this time. Alert and oriented x 3. Discharged in stable condition.    Phlebotomy given today per MD orders. Tolerated infusion without adverse affects. Vital signs stable. No complaints at this time. Discharged from clinic ambulatory in stable condition. Alert and oriented x 3. F/U with [facility] as scheduled.

## 2022-09-16 NOTE — Patient Instructions (Signed)
[facility]  Discharge Instructions: Thank you for choosing [facility] to provide your oncology and hematology care.  If you have a lab appointment with the Cancer Center, please come in thru the Main Entrance and check in at the main information desk.  Wear comfortable clothing and clothing appropriate for easy access to any Portacath or PICC line.   We strive to give you quality time with your provider. You may need to reschedule your appointment if you arrive late (15 or more minutes).  Arriving late affects you and other patients whose appointments are after yours.  Also, if you miss three or more appointments without notifying the office, you may be dismissed from the clinic at the provider's discretion.      For prescription refill requests, have your pharmacy contact our office and allow 72 hours for refills to be completed.    Today you received the following chemotherapy and/or immunotherapy agents phlebotomy. Therapeutic Phlebotomy Discharge Instructions  - Increase your fluid intake over the next 4 hours  - No smoking for 30 minutes  - Avoid using the affected arm (the one you had the blood drawn from) for heavy lifting or other activities.  - You may resume all normal activities after 30 minutes.  You are to notify the office if you experience:   - Persistent dizziness and/or lightheadedness - Uncontrolled or excessive bleeding at the site.        To help prevent nausea and vomiting after your treatment, we encourage you to take your nausea medication as directed.  BELOW ARE SYMPTOMS THAT SHOULD BE REPORTED IMMEDIATELY: *FEVER GREATER THAN 100.4 F (38 C) OR HIGHER *CHILLS OR SWEATING *NAUSEA AND VOMITING THAT IS NOT CONTROLLED WITH YOUR NAUSEA MEDICATION *UNUSUAL SHORTNESS OF BREATH *UNUSUAL BRUISING OR BLEEDING *URINARY PROBLEMS (pain or burning when urinating, or frequent urination) *BOWEL PROBLEMS (unusual diarrhea,  constipation, pain near the anus) TENDERNESS IN MOUTH AND THROAT WITH OR WITHOUT PRESENCE OF ULCERS (sore throat, sores in mouth, or a toothache) UNUSUAL RASH, SWELLING OR PAIN  UNUSUAL VAGINAL DISCHARGE OR ITCHING   Items with * indicate a potential emergency and should be followed up as soon as possible or go to the Emergency Department if any problems should occur.  Please show the CHEMOTHERAPY ALERT CARD or IMMUNOTHERAPY ALERT CARD at check-in to the Emergency Department and triage nurse.  Should you have questions after your visit or need to cancel or reschedule your appointment, please contact [facility] AT [facility] [phone]  and follow the prompts.  Office hours are 8:00 a.m. to 4:30 p.m. Monday - Friday. Please note that voicemails left after 4:00 p.m. may not be returned until the following business day.  We are closed weekends and major holidays. You have access to a nurse at all times for urgent questions. Please call the main number to the clinic [phone] and follow the prompts.  For any non-urgent questions, you may also contact your provider using MyChart. We now offer e-Visits for anyone 50 and older to request care online for non-urgent symptoms. For details visit [email or web address].   Also download the MyChart app! Go to the app store, search "MyChart", open the app, select [facility], and log in with your MyChart username and password.  Masks are optional in the cancer centers. If you would like for your care team to wear a mask while they are taking care of you, please let them know. You may have one support person  who is at least 59 years old accompany you for your appointments.

## 2022-09-17 ENCOUNTER — Institutional Professional Consult (permissible substitution): Payer: Managed Care, Other (non HMO) | Admitting: Pulmonary Disease

## 2022-09-17 ENCOUNTER — Inpatient Hospital Stay: Payer: Managed Care, Other (non HMO)

## 2022-09-17 ENCOUNTER — Inpatient Hospital Stay: Payer: Managed Care, Other (non HMO) | Admitting: Physician Assistant

## 2022-09-21 ENCOUNTER — Inpatient Hospital Stay: Payer: Managed Care, Other (non HMO)

## 2022-09-22 ENCOUNTER — Ambulatory Visit (INDEPENDENT_AMBULATORY_CARE_PROVIDER_SITE_OTHER): Payer: Managed Care, Other (non HMO) | Admitting: Nurse Practitioner

## 2022-09-22 ENCOUNTER — Encounter: Payer: Self-pay | Admitting: Nurse Practitioner

## 2022-09-22 DIAGNOSIS — R051 Acute cough: Secondary | ICD-10-CM

## 2022-09-22 MED ORDER — AZITHROMYCIN 250 MG PO TABS: ORAL_TABLET | ORAL | 0 refills | Status: DC

## 2022-09-22 MED ORDER — PROMETHAZINE-DM 6.25-15 MG/5ML PO SYRP: 5.0000 mL | ORAL_SOLUTION | Freq: Four times a day (QID) | ORAL | 0 refills | Status: DC | PRN

## 2022-09-22 NOTE — Patient Instructions (Signed)

## 2022-09-22 NOTE — Progress Notes (Signed)
Virtual Visit  Note Due to COVID-19 pandemic this visit was conducted virtually. This visit type was conducted due to national recommendations for restrictions regarding the COVID-19 Pandemic (e.g. social distancing, sheltering in place) in an effort to limit this patient's exposure and mitigate transmission in our community. All issues noted in this document were discussed and addressed.  A physical exam was not performed with this format.  I connected with Garrett Lin on 09/22/22 at 9:02 by telephone and verified that I am speaking with the correct person using two identifiers. Garrett Lin is currently located at home and no one is currently with him during visit. The provider, Mary-Margaret Daphine Deutscher, FNP is located in their office at time of visit.  I discussed the limitations, risks, security and privacy concerns of performing an evaluation and management service by telephone and the availability of in person appointments. I also discussed with the patient that there may be a patient responsible charge related to this service. The patient expressed understanding and agreed to proceed.   History and Present Illness:  Cough This is a new problem. Episode onset: monday. The problem has been waxing and waning. The problem occurs every few hours. The cough is Productive of sputum. Associated symptoms include chills, a fever (101), nasal congestion, rhinorrhea and a sore throat. Pertinent negatives include no ear congestion or ear pain. Nothing aggravates the symptoms. He has tried nothing for the symptoms. The treatment provided mild relief.   Covid test yesterday was negative   Review of Systems  Constitutional:  Positive for chills and fever (101).  HENT:  Positive for rhinorrhea and sore throat. Negative for ear pain.   Respiratory:  Positive for cough.      Observations/Objective: Alert and oriented- answers all questions appropriately No distress Raspy voice Deep dry  cough  Assessment and Plan: Garrett Lin in today with chief complaint of Cough   1. Acute cough 1. Take meds as prescribed 2. Use a cool mist humidifier especially during the winter months and when heat has been humid. 3. Use saline nose sprays frequently 4. Saline irrigations of the nose can be very helpful if done frequently.  * 4X daily for 1 week*  * Use of a nettie pot can be helpful with this. Follow directions with this* 5. Drink plenty of fluids 6. Keep thermostat turn down low 7.For any cough or congestion- promethazine DM 8. For fever or aces or pains- take tylenol or ibuprofen appropriate for age and weight.  * for fevers greater than 101 orally you may alternate ibuprofen and tylenol every  3 hours.    Meds ordered this encounter  Medications   azithromycin (ZITHROMAX Z-PAK) 250 MG tablet    Sig: As directed    Dispense:  6 tablet    Refill:  0    Order Specific Question:   Supervising Provider    Answer:   Arville Care A [[id number]]   promethazine-dextromethorphan (PROMETHAZINE-DM) 6.25-15 MG/5ML syrup    Sig: Take 5 mLs by mouth 4 (four) times daily as needed for cough.    Dispense:  118 mL    Refill:  0    Order Specific Question:   Supervising Provider    Answer:   Arville Care A [[id number]]     Follow Up Instructions: prn    I discussed the assessment and treatment plan with the patient. The patient was provided an opportunity to ask questions and all were answered. The patient agreed with  the plan and demonstrated an understanding of the instructions.   The patient was advised to call back or seek an in-person evaluation if the symptoms worsen or if the condition fails to improve as anticipated.  The above assessment and management plan was discussed with the patient. The patient verbalized understanding of and has agreed to the management plan. Patient is aware to call the clinic if symptoms persist or worsen. Patient is aware when to return  to the clinic for a follow-up visit. Patient educated on when it is appropriate to go to the emergency department.   Time call ended:  9:15  I provided 13 minutes of  non face-to-face time during this encounter.    Mary-Margaret Daphine Deutscher, FNP

## 2022-09-24 ENCOUNTER — Telehealth: Payer: Self-pay | Admitting: Nurse Practitioner

## 2022-09-24 ENCOUNTER — Inpatient Hospital Stay: Payer: Managed Care, Other (non HMO)

## 2022-09-24 NOTE — Telephone Encounter (Signed)
Ok for note 

## 2022-09-24 NOTE — Telephone Encounter (Signed)
Letter written and left up front for patient pick up. Patient notified and verbalized understanding 

## 2022-09-24 NOTE — Telephone Encounter (Signed)
Please review and advise.

## 2022-09-29 ENCOUNTER — Inpatient Hospital Stay: Payer: Managed Care, Other (non HMO)

## 2022-09-30 ENCOUNTER — Inpatient Hospital Stay: Payer: Managed Care, Other (non HMO)

## 2022-09-30 NOTE — Patient Instructions (Signed)
[facility]  Discharge Instructions: Thank you for choosing [facility] to provide your oncology and hematology care.  If you have a lab appointment with the Cancer Center, please come in thru the Main Entrance and check in at the main information desk.  Wear comfortable clothing and clothing appropriate for easy access to any Portacath or PICC line.   We strive to give you quality time with your provider. You may need to reschedule your appointment if you arrive late (15 or more minutes).  Arriving late affects you and other patients whose appointments are after yours.  Also, if you miss three or more appointments without notifying the office, you may be dismissed from the clinic at the provider's discretion.      For prescription refill requests, have your pharmacy contact our office and allow 72 hours for refills to be completed.    To help prevent nausea and vomiting after your treatment, we encourage you to take your nausea medication as directed.  BELOW ARE SYMPTOMS THAT SHOULD BE REPORTED IMMEDIATELY: *FEVER GREATER THAN 100.4 F (38 C) OR HIGHER *CHILLS OR SWEATING *NAUSEA AND VOMITING THAT IS NOT CONTROLLED WITH YOUR NAUSEA MEDICATION *UNUSUAL SHORTNESS OF BREATH *UNUSUAL BRUISING OR BLEEDING *URINARY PROBLEMS (pain or burning when urinating, or frequent urination) *BOWEL PROBLEMS (unusual diarrhea, constipation, pain near the anus) TENDERNESS IN MOUTH AND THROAT WITH OR WITHOUT PRESENCE OF ULCERS (sore throat, sores in mouth, or a toothache) UNUSUAL RASH, SWELLING OR PAIN  UNUSUAL VAGINAL DISCHARGE OR ITCHING   Items with * indicate a potential emergency and should be followed up as soon as possible or go to the Emergency Department if any problems should occur.  Please show the CHEMOTHERAPY ALERT CARD or IMMUNOTHERAPY ALERT CARD at check-in to the Emergency Department and triage nurse.  Should you have questions after your visit or need to  cancel or reschedule your appointment, please contact [facility] [place] [phone]  and follow the prompts.  Office hours are 8:00 a.m. to 4:30 p.m. Monday - Friday. Please note that voicemails left after 4:00 p.m. may not be returned until the following business day.  We are closed weekends and major holidays. You have access to a nurse at all times for urgent questions. Please call the main number to the clinic [phone] and follow the prompts.  For any non-urgent questions, you may also contact your provider using MyChart. We now offer e-Visits for anyone 18 and older to request care online for non-urgent symptoms. For details visit [email or web address].   Also download the MyChart app! Go to the app store, search "MyChart", open the app, select [place], and log in with your MyChart username and password.   

## 2022-09-30 NOTE — Progress Notes (Signed)
Patient presents today for therapeutic phlebotomy.  Patient is in satisfactory condition with no new complaints voiced.  Vital signs are stable.  We will proceed with phlebotomy per provider orders.   Therapeutic phlebotomy started at 1437 and ended at 1455.  200 mL of blood was removed from patient.  We were unable able to get the full 250 mL.  Vital signs remained stable.  Patient refused to wait the recommended 30 minute post phlebotomy wait time.  Patient left ambulatory in stable condition.

## 2022-10-01 ENCOUNTER — Inpatient Hospital Stay: Payer: Managed Care, Other (non HMO)

## 2022-10-08 ENCOUNTER — Inpatient Hospital Stay: Payer: Managed Care, Other (non HMO)

## 2022-10-08 ENCOUNTER — Inpatient Hospital Stay (HOSPITAL_BASED_OUTPATIENT_CLINIC_OR_DEPARTMENT_OTHER): Payer: Managed Care, Other (non HMO) | Attending: Hematology | Admitting: Nurse Practitioner

## 2022-10-08 ENCOUNTER — Inpatient Hospital Stay: Payer: Managed Care, Other (non HMO) | Attending: Hematology

## 2022-10-08 ENCOUNTER — Other Ambulatory Visit: Payer: Self-pay

## 2022-10-08 DIAGNOSIS — Z79899 Other long term (current) drug therapy: Secondary | ICD-10-CM | POA: Insufficient documentation

## 2022-10-08 LAB — CBC WITH DIFFERENTIAL/PLATELET
Abs Immature Granulocytes: 0.01 10*3/uL (ref 0.00–0.07)
Basophils Absolute: 0.1 10*3/uL (ref 0.0–0.1)
Basophils Relative: 1 %
Eosinophils Absolute: 0.1 10*3/uL (ref 0.0–0.5)
Eosinophils Relative: 2 %
HCT: 46.8 % (ref 39.0–52.0)
Hemoglobin: 16.1 g/dL (ref 13.0–17.0)
Immature Granulocytes: 0 %
Lymphocytes Relative: 26 %
Lymphs Abs: 1.8 10*3/uL (ref 0.7–4.0)
MCH: 31.9 pg (ref 26.0–34.0)
MCHC: 34.4 g/dL (ref 30.0–36.0)
MCV: 92.9 fL (ref 80.0–100.0)
Monocytes Absolute: 0.6 10*3/uL (ref 0.1–1.0)
Monocytes Relative: 9 %
Neutro Abs: 4.4 10*3/uL (ref 1.7–7.7)
Neutrophils Relative %: 62 %
Platelets: 374 10*3/uL (ref 150–400)
RBC: 5.04 MIL/uL (ref 4.22–5.81)
RDW: 11.6 % (ref 11.5–15.5)
WBC: 6.9 10*3/uL (ref 4.0–10.5)
nRBC: 0 % (ref 0.0–0.2)

## 2022-10-08 LAB — IRON AND TIBC
Iron: 125 ug/dL (ref 45–182)
Saturation Ratios: 48 % — ABNORMAL HIGH (ref 17.9–39.5)
TIBC: 259 ug/dL (ref 250–450)
UIBC: 134 ug/dL

## 2022-10-08 LAB — COMPREHENSIVE METABOLIC PANEL
ALT: 13 U/L (ref 0–44)
AST: 17 U/L (ref 15–41)
Albumin: 4 g/dL (ref 3.5–5.0)
Alkaline Phosphatase: 84 U/L (ref 38–126)
Anion gap: 6 (ref 5–15)
BUN: 15 mg/dL (ref 6–20)
CO2: 25 mmol/L (ref 22–32)
Calcium: 9.2 mg/dL (ref 8.9–10.3)
Chloride: 104 mmol/L (ref 98–111)
Creatinine, Ser: 0.91 mg/dL (ref 0.61–1.24)
GFR, Estimated: >60 mL/min (ref >60)
Glucose, Bld: 107 mg/dL — ABNORMAL HIGH (ref 70–99)
Potassium: 3.9 mmol/L (ref 3.5–5.1)
Sodium: 135 mmol/L (ref 135–145)
Total Bilirubin: 0.7 mg/dL (ref 0.3–1.2)
Total Protein: 7.4 g/dL (ref 6.5–8.1)

## 2022-10-08 LAB — FERRITIN: Ferritin: 177 ng/mL (ref 24–336)

## 2022-10-08 NOTE — Progress Notes (Signed)
[facility] [address], [place] [address]   CLINIC:  Medical Oncology/Hematology  PCP:  Garrett Fudge, FNP [address] [place] [address] [phone]  Virtual Visit Progress Note  I connected with Garrett Lin on 10/08/22 at  8:30 AM EST by video enabled telemedicine visit and verified that I am speaking with the correct person using two identifiers.   I discussed the limitations, risks, security and privacy concerns of performing an evaluation and management service by telemedicine and the availability of in-person appointments. I also discussed with the patient that there may be a patient responsible charge related to this service. The patient expressed understanding and agreed to proceed.   Other persons participating in the visit and their role in the encounter: RN, NP, Patient   Patient's location: [facility]  Provider's location: home  REASON FOR VISIT:  Follow-up for hereditary hemochromatosis   PRIOR THERAPY: Weekly phlebotomy (500 mL each)   CURRENT THERAPY: Weekly phlebotomy (250 mL each)  INTERVAL HISTORY:  [name] 62 y.o. male returns for routine follow-up of hereditary hemochromatosis. He has been receiving weekly 250 cc volume phlebotomies. Feels well. Energy has improved with phlebotomy. Brother is also requiring phlebotomy. Reduced skin bronzing. Denies complaints.   REVIEW OF SYSTEMS:  Review of Systems  Constitutional:  Negative for appetite change, chills, diaphoresis, fatigue, fever and unexpected weight change.  HENT:   Negative for lump/mass and nosebleeds.   Eyes:  Negative for eye problems.  Respiratory:  Negative for cough, hemoptysis and shortness of breath.   Cardiovascular:  Negative for chest pain, leg swelling and palpitations.  Gastrointestinal:  Negative for abdominal pain, blood in stool, constipation, diarrhea, nausea and vomiting.  Genitourinary:  Negative for hematuria.   Skin: Negative.    Neurological:  Negative for dizziness, headaches and light-headedness.  Hematological:  Does not bruise/bleed easily.      PAST MEDICAL/SURGICAL HISTORY:  Past Medical History:  Diagnosis Date   Chronic pain    right forearm.   Hereditary hemochromatosis (HCC) 08/07/2021   Irritable bowel syndrome (IBS) 2006, 2007   "nervous stomach" abdominal pain   Small bowel obstruction due to adhesions (HCC) 09/02/2013   Past Surgical History:  Procedure Laterality Date   COLONOSCOPY  2006   Carman Ching MD   COLONOSCOPY WITH PROPOFOL N/A 08/11/2021   Procedure: COLONOSCOPY WITH PROPOFOL;  Surgeon: Lanelle Bal, DO;  Location: [facility];  Service: Endoscopy;  Laterality: N/A;  9:00 / ASA II   ESOPHAGOGASTRODUODENOSCOPY  2006   Raymondo Band MD   LAPAROSCOPIC LYSIS OF ADHESIONS N/A 09/02/2013   Procedure: LAPAROSCOPIC LYSIS OF ADHESIONS ;  Surgeon: [name], MD;  Location: [facility] OR;  Service: General;  Laterality: N/A;   SHOULDER ARTHROSCOPY Right 04/2014   Dr. Ranell Patrick   surgery for right forarm trauma     work related machine injury     SOCIAL HISTORY:  Social History   Socioeconomic History   Marital status: Married    Spouse name: Not on file   Number of children: Not on file   Years of education: Not on file   Highest education level: Not on file  Occupational History   Not on file  Tobacco Use   Smoking status: Every Day    Packs/day: 1.00    Years: 38.00    Total pack years: 38.00    Types: Cigarettes   Smokeless tobacco: Never  Vaping Use   Vaping  Use: Never used  Substance and Sexual Activity   Alcohol use: No   Drug use: No   Sexual activity: Not on file  Other Topics Concern   Not on file  Social History Narrative   Not on file   Social Determinants of Health   Financial Resource Strain: Not on file  Food Insecurity: Not on file  Transportation Needs: Not on file  Physical Activity: Not on file  Stress: Not on file  Social Connections:  Not on file  Intimate Partner Violence: Not on file    FAMILY HISTORY:  Family History  Problem Relation Age of Onset   Ovarian cancer Sister     CURRENT MEDICATIONS:  Outpatient Encounter Medications as of 10/08/2022  Medication Sig   Aspirin-Acetaminophen-Caffeine (GOODY HEADACHE PO) Take 1 packet by mouth daily as needed (pain).   CLENPIQ 10-3.5-12 MG-GM -GM/160ML SOLN SMARTSIG:320 Milliliter(s) By Mouth As Directed   meloxicam (MOBIC) 7.5 MG tablet Take 1 tablet (7.5 mg total) by mouth daily.   promethazine-dextromethorphan (PROMETHAZINE-DM) 6.25-15 MG/5ML syrup Take 5 mLs by mouth 4 (four) times daily as needed for cough.   rosuvastatin (CRESTOR) 5 MG tablet Take 1 tablet (5 mg total) by mouth daily.   No facility-administered encounter medications on file as of 10/08/2022.    ALLERGIES:  No Known Allergies   PHYSICAL EXAM:  ECOG PERFORMANCE STATUS: 0 - Asymptomatic  There were no vitals filed for this visit. There were no vitals filed for this visit. Physical Exam Vitals reviewed.  Constitutional:      Appearance: He is not ill-appearing.  Pulmonary:     Effort: No respiratory distress.  Skin:    Coloration: Skin is not pale.  Neurological:     Mental Status: He is alert and oriented to person, place, and time.  Psychiatric:        Mood and Affect: Mood normal.        Behavior: Behavior normal.     LABORATORY DATA:  I have reviewed the labs as listed.  CBC    Component Value Date/Time   WBC 6.9 10/08/2022 0812   RBC 5.04 10/08/2022 0812   HGB 16.1 10/08/2022 0812   HGB 17.3 05/13/2021 0954   HCT 46.8 10/08/2022 0812   HCT 50.8 05/13/2021 0954   PLT 374 10/08/2022 0812   PLT 325 05/13/2021 0954   MCV 92.9 10/08/2022 0812   MCV 93 05/13/2021 0954   MCH 31.9 10/08/2022 0812   MCHC 34.4 10/08/2022 0812   RDW 11.6 10/08/2022 0812   RDW 12.1 05/13/2021 0954   LYMPHSABS 1.8 10/08/2022 0812   LYMPHSABS 2.0 05/13/2021 0954   MONOABS 0.6 10/08/2022 0812    EOSABS 0.1 10/08/2022 0812   EOSABS 0.2 05/13/2021 0954   BASOSABS 0.1 10/08/2022 0812   BASOSABS 0.1 05/13/2021 0954      Latest Ref Rng & Units 10/08/2022    8:12 AM 06/25/2022    9:36 AM 07/23/2021   10:11 AM  CMP  Glucose 70 - 99 mg/dL 607  90    BUN 6 - 20 mg/dL 15  20    Creatinine 3.71 - 1.24 mg/dL 0.62  6.94    Sodium 854 - 145 mmol/L 135  137    Potassium 3.5 - 5.1 mmol/L 3.9  4.3    Chloride 98 - 111 mmol/L 104  103    CO2 22 - 32 mmol/L 25  25    Calcium 8.9 - 10.3 mg/dL 9.2  9.4  Total Protein 6.5 - 8.1 g/dL 7.4  7.9  7.3   Total Bilirubin 0.3 - 1.2 mg/dL 0.7  0.7  0.9   Alkaline Phos 38 - 126 U/L 84  86  75   AST 15 - 41 U/L 17  16  26    ALT 0 - 44 U/L 13  15  32    Iron/TIBC/Ferritin/ %Sat    Component Value Date/Time   IRON 125 10/08/2022 0812   TIBC 259 10/08/2022 0812   FERRITIN 254 06/25/2022 0936   IRONPCTSAT 48 (H) 10/08/2022 5916    DIAGNOSTIC IMAGING:  I have independently reviewed the relevant imaging and discussed with the patient.  ASSESSMENT & PLAN: 1.  Hereditary hemochromatosis (C282Y homozygous) - Severe hepatic iron deposition was incidental finding on recent MRI abdomen (06/11/2021), which was obtained by PCP for monitoring of previously noted hepatic cyst. - MRI abdomen (06/11/2021) showed severe diffuse hepatic iron deposition, as well as tiny < 5 mm cyst which correlates with lesion seen on prior CT; no evidence of hepatic neoplasm. - Hemochromatosis DNA testing shows homozygosity for C282Y HFE gene (07/23/2021).  No abnormalities on hepatic function panel.  Viral hepatitis panel was negative. - Cardiac MRI (09/16/2021) was negative for cardiac hemochromatosis. - At presentation, he had symptomatic iron overload with fatigue, joint pain, and abdominal pain after eating    - Discussed  course of untreated hereditary hemochromatosis with risk of cirrhosis, hepatocellular carcinoma, heart failure, and arrhythmia.  Iron overload also increases risk  of certain bacterial infections. - We have discussed lifestyle modifications such as avoiding alcohol and iron supplementation.  Recent guidelines do not find that moderate red meat intake adversely affects patients with hemochromatosis.  Patient should avoid raw fish or undercooked meat due to risk of certain bacterial infections to thrive and iron rich environments. - We have discussed phlebotomy protocol for treatment of hemochromatosis: Weekly phlebotomy as tolerated until goal ferritin is reached. If Hgb drops < 11.0, we will decrease frequency of phlebotomy. If patient experiences severe side effects or hemodynamic instability, we will decrease amount taken for phlebotomy. Goal is ferritin < 50 -100 for normalization of iron stores. Once iron stores have normalized, we will decrease frequency of phlebotomy. - Initially started on weekly 500 mL phlebotomy, decreased to weekly 250 mL phlebotomy due to significant fatigue after full phlebotomy. - Symptoms today are improved, as he reports decreased fatigue, decreased joint pain, and decreased skin bronzing.  Appetite is improved.  - labs today reviewed. Hmg has improved. Ferritin pending at time of visit. He will receive phlebotomy today. If within goal can reduced to every other week. Continue low volume phlebotomy of 250 cc.  - PLAN: Weekly phlebotomy (250 mL each) as above.  Monthly CBC.   - Recommend genetic testing of first-degree relatives (parents, siblings, children), as early detection and treatment can help prevent complications such as liver damage, cirrhosis, HCC.  - Recommend discussing [place] screening with pcp.  - We will defer GI referral for now, since there were no signs of liver damage apart from iron overload on imaging or labs - Repeat CBC, CMP, ferritin, and iron panel with RTC in 3 months.   2.  Tobacco abuse - Patient has been smoking 1.0-1.5 PPD since age 59. - This patient meets criteria for low-dose CT lung cancer  screening (age 71-80 with a 20+ pack year history, current everyday smoker /OR/ quit < 15 years ago, no current signs or symptoms of lung cancer) - Shared  decision making visit completed on 11/06/2021 - Low-dose CT chest (03/05/2022): Pulmonary nodules, lung RADS 2 (benign appearance or behavior); partially calcified bilateral pleural plaques indicative of asbestos-related pleural disease, aortic atherosclerosis, emphysema, coronary artery disease - PLAN: Continue annual LDCT scans. Next due in June 2024.  - Patient can follow-up with PCP regarding aortic atherosclerosis and coronary artery atherosclerosis (incidental findings). - At net visit ensure he has followed up with pulmonology re: incidentals/asbestos.     3.  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  Prior to that, he worked at a [employer] for 20+ years. - SUBSTANCE: He smokes 1.0-1.5 PPD cigarettes since age 44.  He drinks socially, usually 1 or 2 mixed drinks once or twice a year.  He has a previous history of heavy alcohol use, but quit 26 years ago (age 4).  He denies any illicit drug use. - FAMILY: No family history of liver disease, hemochromatosis, blood disorders, or iron disorders.  Patient's mother had breast cancer. - [place] Screening- in setting of hemochromatosis   All questions were answered. The patient knows to call the clinic with any problems, questions or concerns.  Medical decision making: Low     Return to clinic  Weekly phlebotomy 250 cc 1 mo- cbc 2 mo- cbc 3 mo- labs (cbc, cmp, ferritin, iron studies), Rebekah, +/- phlebotomy- la    I discussed the assessment and treatment plan with the patient. The patient was provided an opportunity to ask questions and all were answered. The patient agreed with the plan and demonstrated an understanding of the instructions.   The patient was  advised to call back or seek an in-person evaluation if the symptoms worsen or if the condition fails to improve as anticipated.   I spent 20 minutes face-to-face video visit time dedicated to the care of this patient on the date of this encounter to include pre-visit review of heme/onc notes, labs, imaging, face-to-face time with the patient, and post visit ordering of testing/documentation.   Beckey Rutter, DNP, AGNP-C [place] [phone]

## 2022-10-08 NOTE — Progress Notes (Signed)
Patient presents today for phlebotomy per MD orders.  Hgb 16. Phlebotomy procedure started at 0843 and ended at  0846. 250 cc removed. Patient tolerated procedure well. Procedure tolerated well and without incident. Discharged ambulatory in stable condition.

## 2022-10-08 NOTE — Patient Instructions (Signed)
[facility]  Discharge Instructions: Thank you for choosing [facility] to provide your oncology and hematology care.  If you have a lab appointment with the Cancer Center, please come in thru the Main Entrance and check in at the main information desk.  Wear comfortable clothing and clothing appropriate for easy access to any Portacath or PICC line.   We strive to give you quality time with your provider. You may need to reschedule your appointment if you arrive late (15 or more minutes).  Arriving late affects you and other patients whose appointments are after yours.  Also, if you miss three or more appointments without notifying the office, you may be dismissed from the clinic at the provider's discretion.      For prescription refill requests, have your pharmacy contact our office and allow 72 hours for refills to be completed.    Today you received the following chemotherapy and/or immunotherapy agents phlebotomy      To help prevent nausea and vomiting after your treatment, we encourage you to take your nausea medication as directed.  BELOW ARE SYMPTOMS THAT SHOULD BE REPORTED IMMEDIATELY: *FEVER GREATER THAN 100.4 F (38 C) OR HIGHER *CHILLS OR SWEATING *NAUSEA AND VOMITING THAT IS NOT CONTROLLED WITH YOUR NAUSEA MEDICATION *UNUSUAL SHORTNESS OF BREATH *UNUSUAL BRUISING OR BLEEDING *URINARY PROBLEMS (pain or burning when urinating, or frequent urination) *BOWEL PROBLEMS (unusual diarrhea, constipation, pain near the anus) TENDERNESS IN MOUTH AND THROAT WITH OR WITHOUT PRESENCE OF ULCERS (sore throat, sores in mouth, or a toothache) UNUSUAL RASH, SWELLING OR PAIN  UNUSUAL VAGINAL DISCHARGE OR ITCHING   Items with * indicate a potential emergency and should be followed up as soon as possible or go to the Emergency Department if any problems should occur.  Please show the CHEMOTHERAPY ALERT CARD or IMMUNOTHERAPY ALERT CARD at check-in to the  Emergency Department and triage nurse.  Should you have questions after your visit or need to cancel or reschedule your appointment, please contact [facility] [phone]  and follow the prompts.  Office hours are 8:00 a.m. to 4:30 p.m. Monday - Friday. Please note that voicemails left after 4:00 p.m. may not be returned until the following business day.  We are closed weekends and major holidays. You have access to a nurse at all times for urgent questions. Please call the main number to the clinic [phone] and follow the prompts.  For any non-urgent questions, you may also contact your provider using MyChart. We now offer e-Visits for anyone 18 and older to request care online for non-urgent symptoms. For details visit [email or web address].   Also download the MyChart app! Go to the app store, search "MyChart", open the app, select [place], and log in with your MyChart username and password.   

## 2022-10-13 ENCOUNTER — Telehealth: Payer: Managed Care, Other (non HMO) | Admitting: Family Medicine

## 2022-10-13 DIAGNOSIS — Z20828 Contact with and (suspected) exposure to other viral communicable diseases: Secondary | ICD-10-CM

## 2022-10-13 MED ORDER — OSELTAMIVIR PHOSPHATE 75 MG PO CAPS: 75.0000 mg | ORAL_CAPSULE | Freq: Every day | ORAL | 0 refills | Status: AC

## 2022-10-13 NOTE — Progress Notes (Signed)
Telephone visit  Subjective: CC: flu exposure PCP: Loman Brooklyn, FNP Garrett Lin is a 62 y.o. male. Patient provides verbal consent for consult held via phone.  Due to COVID-19 pandemic this visit was conducted virtually. This visit type was conducted due to national recommendations for restrictions regarding the COVID-19 Pandemic (e.g. social distancing, sheltering in place) in an effort to limit this patient's exposure and mitigate transmission in our community. All issues noted in this document were discussed and addressed.  A physical exam was not performed with this format.   Location of patient: home Location of provider: [facility] Others present for call: none  1. Flu Patient was around 3 people that had flu last week.  His wife is actively sick with the flu and he does not want to catch it.  He denies any myalgia, cough, congestion or other infectious symptoms.  ROS: Per HPI  No Known Allergies Past Medical History:  Diagnosis Date   Chronic pain    right forearm.   Hereditary hemochromatosis ([place]) 08/07/2021   Irritable bowel syndrome (IBS) 2006, 2007   "nervous stomach" abdominal pain   Small bowel obstruction due to adhesions ([place]) 09/02/2013    Current Outpatient Medications:    Aspirin-Acetaminophen-Caffeine (GOODY HEADACHE PO), Take 1 packet by mouth daily as needed (pain)., Disp: , Rfl:    CLENPIQ 10-3.5-12 MG-GM -GM/160ML SOLN, SMARTSIG:320 Milliliter(s) By Mouth As Directed, Disp: , Rfl:    meloxicam (MOBIC) 7.5 MG tablet, Take 1 tablet (7.5 mg total) by mouth daily., Disp: 30 tablet, Rfl: 2   promethazine-dextromethorphan (PROMETHAZINE-DM) 6.25-15 MG/5ML syrup, Take 5 mLs by mouth 4 (four) times daily as needed for cough., Disp: 118 mL, Rfl: 0   rosuvastatin (CRESTOR) 5 MG tablet, Take 1 tablet (5 mg total) by mouth daily., Disp: 30 tablet, Rfl: 2  Assessment/ Plan: 62 y.o. male   Exposure to the flu - Plan: oseltamivir (TAMIFLU) 75 MG capsule  Will  put on prevention for influenza.  Renal function is normal so may take 75 mg daily.  Caution nausea.  Follow-up if develops any concerning symptoms for influenza or other infection.  Follow-up as needed  Start time: 12:45pm End time: 12:50pm  Total time spent on patient care (including video visit/ documentation): 5 minutes  [place], [place] [phone]

## 2022-10-15 ENCOUNTER — Inpatient Hospital Stay: Payer: Managed Care, Other (non HMO)

## 2022-10-15 NOTE — Progress Notes (Signed)
Garrett Lin presents today for phlebotomy per MD orders. Phlebotomy procedure started at 1255 and ended at 1259. 250 cc removed. Patient tolerated procedure well. IV needle removed intact.

## 2022-10-15 NOTE — Patient Instructions (Signed)
[facility] [place] [facility]  Discharge Instructions: Thank you for choosing [facility] to provide your oncology and hematology care.  If you have a lab appointment with the Cancer Center, please come in thru the Main Entrance and check in at the main information desk.  Wear comfortable clothing and clothing appropriate for easy access to any Portacath or PICC line.   We strive to give you quality time with your provider. You may need to reschedule your appointment if you arrive late (15 or more minutes).  Arriving late affects you and other patients whose appointments are after yours.  Also, if you miss three or more appointments without notifying the office, you may be dismissed from the clinic at the provider's discretion.      For prescription refill requests, have your pharmacy contact our office and allow 72 hours for refills to be completed.    Today you received the following phlebotomy, return as scheduled.   To help prevent nausea and vomiting after your treatment, we encourage you to take your nausea medication as directed.  BELOW ARE SYMPTOMS THAT SHOULD BE REPORTED IMMEDIATELY: *FEVER GREATER THAN 100.4 F (38 C) OR HIGHER *CHILLS OR SWEATING *NAUSEA AND VOMITING THAT IS NOT CONTROLLED WITH YOUR NAUSEA MEDICATION *UNUSUAL SHORTNESS OF BREATH *UNUSUAL BRUISING OR BLEEDING *URINARY PROBLEMS (pain or burning when urinating, or frequent urination) *BOWEL PROBLEMS (unusual diarrhea, constipation, pain near the anus) TENDERNESS IN MOUTH AND THROAT WITH OR WITHOUT PRESENCE OF ULCERS (sore throat, sores in mouth, or a toothache) UNUSUAL RASH, SWELLING OR PAIN  UNUSUAL VAGINAL DISCHARGE OR ITCHING   Items with * indicate a potential emergency and should be followed up as soon as possible or go to the Emergency Department if any problems should occur.  Please show the CHEMOTHERAPY ALERT CARD or IMMUNOTHERAPY ALERT CARD at check-in to the Emergency Department and  triage nurse.  Should you have questions after your visit or need to cancel or reschedule your appointment, please contact [facility] [phone]  and follow the prompts.  Office hours are 8:00 a.m. to 4:30 p.m. Monday - Friday. Please note that voicemails left after 4:00 p.m. may not be returned until the following business day.  We are closed weekends and major holidays. You have access to a nurse at all times for urgent questions. Please call the main number to the clinic [phone] and follow the prompts.  For any non-urgent questions, you may also contact your provider using MyChart. We now offer e-Visits for anyone 18 and older to request care online for non-urgent symptoms. For details visit [email or web address].   Also download the MyChart app! Go to the app store, search "MyChart", open the app, select [facility], and log in with your MyChart username and password.   

## 2022-10-22 ENCOUNTER — Inpatient Hospital Stay: Payer: Managed Care, Other (non HMO)

## 2022-10-22 NOTE — Patient Instructions (Signed)
[facility] [place]  Discharge Instructions: Thank you for choosing [facility] to provide your oncology and hematology care.  If you have a lab appointment with the Cancer Center, please come in thru the Main Entrance and check in at the main information desk.  Wear comfortable clothing and clothing appropriate for easy access to any Portacath or PICC line.   We strive to give you quality time with your provider. You may need to reschedule your appointment if you arrive late (15 or more minutes).  Arriving late affects you and other patients whose appointments are after yours.  Also, if you miss three or more appointments without notifying the office, you may be dismissed from the clinic at the provider's discretion.      For prescription refill requests, have your pharmacy contact our office and allow 72 hours for refills to be completed.    To help prevent nausea and vomiting after your treatment, we encourage you to take your nausea medication as directed.  BELOW ARE SYMPTOMS THAT SHOULD BE REPORTED IMMEDIATELY: *FEVER GREATER THAN 100.4 F (38 C) OR HIGHER *CHILLS OR SWEATING *NAUSEA AND VOMITING THAT IS NOT CONTROLLED WITH YOUR NAUSEA MEDICATION *UNUSUAL SHORTNESS OF BREATH *UNUSUAL BRUISING OR BLEEDING *URINARY PROBLEMS (pain or burning when urinating, or frequent urination) *BOWEL PROBLEMS (unusual diarrhea, constipation, pain near the anus) TENDERNESS IN MOUTH AND THROAT WITH OR WITHOUT PRESENCE OF ULCERS (sore throat, sores in mouth, or a toothache) UNUSUAL RASH, SWELLING OR PAIN  UNUSUAL VAGINAL DISCHARGE OR ITCHING   Items with * indicate a potential emergency and should be followed up as soon as possible or go to the Emergency Department if any problems should occur.  Please show the CHEMOTHERAPY ALERT CARD or IMMUNOTHERAPY ALERT CARD at check-in to the Emergency Department and triage nurse.  Should you have questions after your visit or need to  cancel or reschedule your appointment, please contact [facility] [place] [phone]  and follow the prompts.  Office hours are 8:00 a.m. to 4:30 p.m. Monday - Friday. Please note that voicemails left after 4:00 p.m. may not be returned until the following business day.  We are closed weekends and major holidays. You have access to a nurse at all times for urgent questions. Please call the main number to the clinic [phone] and follow the prompts.  For any non-urgent questions, you may also contact your provider using MyChart. We now offer e-Visits for anyone 18 and older to request care online for non-urgent symptoms. For details visit [email or web address].   Also download the MyChart app! Go to the app store, search "MyChart", open the app, select [facility], and log in with your MyChart username and password.   

## 2022-10-22 NOTE — Progress Notes (Signed)
Patient presents today for therapeutic phlebotomy.  Patient is in satisfactory condition with no complaints voiced.  Vital signs are stable.    Phlebotomy started at 1254 and ended at 1304.  Patient tolerated phlebotomy well and vitals remained stable.  Patient refused to wait the recommended 30 minute post phlebotomy wait time.  Patient left ambulatory in stable condition.

## 2022-10-29 ENCOUNTER — Inpatient Hospital Stay: Payer: Managed Care, Other (non HMO)

## 2022-10-29 NOTE — Progress Notes (Signed)
Garrett Lin presents today for phlebotomy per MD orders. Phlebotomy procedure started at 1252 and ended at 1255. 250 cc removed. Patient tolerated procedure well. IV needle removed intact.

## 2022-10-29 NOTE — Patient Instructions (Signed)
[facility]  Discharge Instructions: Thank you for choosing [facility] to provide your oncology and hematology care.  If you have a lab appointment with the Cancer Center, please come in thru the Main Entrance and check in at the main information desk.  Wear comfortable clothing and clothing appropriate for easy access to any Portacath or PICC line.   We strive to give you quality time with your provider. You may need to reschedule your appointment if you arrive late (15 or more minutes).  Arriving late affects you and other patients whose appointments are after yours.  Also, if you miss three or more appointments without notifying the office, you may be dismissed from the clinic at the provider's discretion.      For prescription refill requests, have your pharmacy contact our office and allow 72 hours for refills to be completed.    Today you received the following phlebotomy, return as scheduled.   To help prevent nausea and vomiting after your treatment, we encourage you to take your nausea medication as directed.  BELOW ARE SYMPTOMS THAT SHOULD BE REPORTED IMMEDIATELY: *FEVER GREATER THAN 100.4 F (38 C) OR HIGHER *CHILLS OR SWEATING *NAUSEA AND VOMITING THAT IS NOT CONTROLLED WITH YOUR NAUSEA MEDICATION *UNUSUAL SHORTNESS OF BREATH *UNUSUAL BRUISING OR BLEEDING *URINARY PROBLEMS (pain or burning when urinating, or frequent urination) *BOWEL PROBLEMS (unusual diarrhea, constipation, pain near the anus) TENDERNESS IN MOUTH AND THROAT WITH OR WITHOUT PRESENCE OF ULCERS (sore throat, sores in mouth, or a toothache) UNUSUAL RASH, SWELLING OR PAIN  UNUSUAL VAGINAL DISCHARGE OR ITCHING   Items with * indicate a potential emergency and should be followed up as soon as possible or go to the Emergency Department if any problems should occur.  Please show the CHEMOTHERAPY ALERT CARD or IMMUNOTHERAPY ALERT CARD at check-in to the Emergency Department and  triage nurse.  Should you have questions after your visit or need to cancel or reschedule your appointment, please contact [facility] [phone]  and follow the prompts.  Office hours are 8:00 a.m. to 4:30 p.m. Monday - Friday. Please note that voicemails left after 4:00 p.m. may not be returned until the following business day.  We are closed weekends and major holidays. You have access to a nurse at all times for urgent questions. Please call the main number to the clinic [phone] and follow the prompts.  For any non-urgent questions, you may also contact your provider using MyChart. We now offer e-Visits for anyone 18 and older to request care online for non-urgent symptoms. For details visit [email or web address].   Also download the MyChart app! Go to the app store, search "MyChart", open the app, select [facility], and log in with your MyChart username and password.   

## 2022-11-04 ENCOUNTER — Other Ambulatory Visit: Payer: Self-pay

## 2022-11-05 ENCOUNTER — Inpatient Hospital Stay: Payer: Managed Care, Other (non HMO) | Attending: Hematology

## 2022-11-05 ENCOUNTER — Inpatient Hospital Stay: Payer: Managed Care, Other (non HMO)

## 2022-11-05 LAB — CBC
HCT: 42.9 % (ref 39.0–52.0)
Hemoglobin: 14.5 g/dL (ref 13.0–17.0)
MCH: 32.4 pg (ref 26.0–34.0)
MCHC: 33.8 g/dL (ref 30.0–36.0)
MCV: 96 fL (ref 80.0–100.0)
Platelets: 283 10*3/uL (ref 150–400)
RBC: 4.47 MIL/uL (ref 4.22–5.81)
RDW: 11.9 % (ref 11.5–15.5)
WBC: 7.7 10*3/uL (ref 4.0–10.5)
nRBC: 0 % (ref 0.0–0.2)

## 2022-11-05 NOTE — Progress Notes (Deleted)
.  phle

## 2022-11-05 NOTE — Patient Instructions (Signed)
[facility]  Discharge Instructions: Thank you for choosing [facility] to provide your oncology and hematology care.  If you have a lab appointment with the Cancer Center, please come in thru the Main Entrance and check in at the main information desk.  Wear comfortable clothing and clothing appropriate for easy access to any Portacath or PICC line.   We strive to give you quality time with your provider. You may need to reschedule your appointment if you arrive late (15 or more minutes).  Arriving late affects you and other patients whose appointments are after yours.  Also, if you miss three or more appointments without notifying the office, you may be dismissed from the clinic at the provider's discretion.      For prescription refill requests, have your pharmacy contact our office and allow 72 hours for refills to be completed.    Today you received the following chemotherapy and/or immunotherapy agents phlebotomy. Therapeutic Phlebotomy, Care After The following information offers guidance on how to care for yourself after your procedure. Your health care provider may also give you more specific instructions. If you have problems or questions, contact your health care provider. What can I expect after the procedure? After therapeutic phlebotomy, it is common to have: Light-headedness or dizziness. You may feel faint. Nausea. Tiredness (fatigue). Follow these instructions at home: Eating and drinking Be sure to eat well-balanced meals for the next 24 hours. Drink enough fluid to keep your urine pale yellow. Avoid drinking alcohol on the day that you had the procedure. Activity  Return to your normal activities as told by your health care provider. Most people can go back to their normal activities right away. Avoid activities that take a lot of effort for about 5 hours after the procedure. Athletes should avoid strenuous exercise for at least 12  hours. Avoid heavy lifting or pulling for about 5 hours after the procedure. Do not lift anything that is heavier than 10 lb (4.5 kg). Change positions slowly for the remainder of the day, like from sitting to standing. This can help prevent light-headedness or fainting. If you feel light-headed, lie down until the feeling goes away. Needle insertion site care  Keep your bandage (dressing) dry. You can remove the bandage after about 5 hours or as told by your health care provider. If you have bleeding from the needle insertion site, raise (elevate) your arm and press firmly on the site until the bleeding stops. If you have bruising at the site, apply ice to the area. To do this: Put ice in a plastic bag. Place a towel between your skin and the bag. Leave the ice on for 20 minutes, 2-3 times a day for the first 24 hours. Remove the ice if your skin turns bright red so you do not damage the area. If the swelling does not go away after 24 hours, apply a warm, moist cloth (warm compress) to the area for 20 minutes, 2-3 times a day. General instructions Do not use any products that contain nicotine or tobacco, like cigarettes, chewing tobacco, and vaping devices, such as e-cigarettes, for at least 30 minutes after the procedure. If you need help quitting, ask your health care provider. Keep all follow-up visits. You may need to continue having regular blood tests and therapeutic phlebotomy treatments as directed. Contact a health care provider if: You have redness, swelling, or pain at the needle insertion site. Fluid or blood is coming from the needle   insertion site. Pus or a bad smell is coming from the needle insertion site. The needle insertion site feels warm to the touch. You feel light-headed, dizzy, or nauseous, and the feeling does not go away. You have new bruising at the needle insertion site. You feel weaker than normal. You have a fever or chills. Get help right away if: You have  chest pain. You have trouble breathing. You have severe nausea or vomiting. Summary After the procedure, it is common to have some light-headedness, dizziness, nausea, or tiredness (fatigue). Be sure to eat well-balanced meals for the next 24 hours. Drink enough fluid to keep your urine pale yellow. Return to your normal activities as told by your health care provider. Keep all follow-up visits. You may need to continue having regular blood tests and therapeutic phlebotomy treatments as directed. This information is not intended to replace advice given to you by your health care provider. Make sure you discuss any questions you have with your health care provider. Document Revised: 03/18/2021 Document Reviewed: 03/18/2021 Elsevier Patient Education  2023 Elsevier Inc.       To help prevent nausea and vomiting after your treatment, we encourage you to take your nausea medication as directed.  BELOW ARE SYMPTOMS THAT SHOULD BE REPORTED IMMEDIATELY: *FEVER GREATER THAN 100.4 F (38 C) OR HIGHER *CHILLS OR SWEATING *NAUSEA AND VOMITING THAT IS NOT CONTROLLED WITH YOUR NAUSEA MEDICATION *UNUSUAL SHORTNESS OF BREATH *UNUSUAL BRUISING OR BLEEDING *URINARY PROBLEMS (pain or burning when urinating, or frequent urination) *BOWEL PROBLEMS (unusual diarrhea, constipation, pain near the anus) TENDERNESS IN MOUTH AND THROAT WITH OR WITHOUT PRESENCE OF ULCERS (sore throat, sores in mouth, or a toothache) UNUSUAL RASH, SWELLING OR PAIN  UNUSUAL VAGINAL DISCHARGE OR ITCHING   Items with * indicate a potential emergency and should be followed up as soon as possible or go to the Emergency Department if any problems should occur.  Please show the CHEMOTHERAPY ALERT CARD or IMMUNOTHERAPY ALERT CARD at check-in to the Emergency Department and triage nurse.  Should you have questions after your visit or need to cancel or reschedule your appointment, please contact [facility]  [phone]  and follow the prompts.  Office hours are 8:00 a.m. to 4:30 p.m. Monday - Friday. Please note that voicemails left after 4:00 p.m. may not be returned until the following business day.  We are closed weekends and major holidays. You have access to a nurse at all times for urgent questions. Please call the main number to the clinic [phone] and follow the prompts.  For any non-urgent questions, you may also contact your provider using MyChart. We now offer e-Visits for anyone 18 and older to request care online for non-urgent symptoms. For details visit [email or web address].   Also download the MyChart app! Go to the app store, search "MyChart", open the app, select [facility], and log in with your MyChart username and password.   

## 2022-11-05 NOTE — Progress Notes (Signed)
Garrett Lin presents for therapeutic phlebotomy per MD orders. Last HGB 14.5 / HCT 42.9 on 11-05-2022 . Vital signs stable prior to procedure. Procedure started at 1300 pm and ended at 1302 pm. 500 mls of blood removed. Patient denies any dizziness , lightheadedness, or feeling faint.  Gauze and coban applied to site. Vital signs stable at completion of procedure. Patient has no complaints at this time. Alert and oriented x 3. Discharged in stable condition.    Phlebotomy given today per MD orders. Tolerated without adverse affects. Vital signs stable. No complaints at this time. Discharged from clinic ambulatory in stable condition. Alert and oriented x 3. F/U with [facility] as scheduled.

## 2022-11-12 ENCOUNTER — Inpatient Hospital Stay: Payer: Managed Care, Other (non HMO)

## 2022-11-12 NOTE — Progress Notes (Signed)
Patient presents today for phlebotomy per MD orders. Phlebotomy procedure started at 1240 and ended at 1320. 250 cc removed. Patient tolerated procedure well. Procedure tolerated well and without incident. Discharged ambulatory in stable condition.

## 2022-11-12 NOTE — Patient Instructions (Signed)
[facility]  Discharge Instructions: Thank you for choosing [facility] to provide your oncology and hematology care.  If you have a lab appointment with the [facility], please come in thru the Main Entrance and check in at the main information desk.  Wear comfortable clothing and clothing appropriate for easy access to any Portacath or PICC line.   We strive to give you quality time with your provider. You may need to reschedule your appointment if you arrive late (15 or more minutes).  Arriving late affects you and other patients whose appointments are after yours.  Also, if you miss three or more appointments without notifying the office, you may be dismissed from the clinic at the provider's discretion.      For prescription refill requests, have your pharmacy contact our office and allow 72 hours for refills to be completed.    Today you received the following chemotherapy and/or immunotherapy agents phlebotomy      To help prevent nausea and vomiting after your treatment, we encourage you to take your nausea medication as directed.  BELOW ARE SYMPTOMS THAT SHOULD BE REPORTED IMMEDIATELY: *FEVER GREATER THAN 100.4 F (38 C) OR HIGHER *CHILLS OR SWEATING *NAUSEA AND VOMITING THAT IS NOT CONTROLLED WITH YOUR NAUSEA MEDICATION *UNUSUAL SHORTNESS OF BREATH *UNUSUAL BRUISING OR BLEEDING *URINARY PROBLEMS (pain or burning when urinating, or frequent urination) *BOWEL PROBLEMS (unusual diarrhea, constipation, pain near the anus) TENDERNESS IN MOUTH AND THROAT WITH OR WITHOUT PRESENCE OF ULCERS (sore throat, sores in mouth, or a toothache) UNUSUAL RASH, SWELLING OR PAIN  UNUSUAL VAGINAL DISCHARGE OR ITCHING   Items with * indicate a potential emergency and should be followed up as soon as possible or go to the Emergency Department if any problems should occur.  Please show the CHEMOTHERAPY ALERT CARD or IMMUNOTHERAPY ALERT CARD at check-in to the  Emergency Department and triage nurse.  Should you have questions after your visit or need to cancel or reschedule your appointment, please contact [facility] [phone]  and follow the prompts.  Office hours are 8:00 a.m. to 4:30 p.m. Monday - Friday. Please note that voicemails left after 4:00 p.m. may not be returned until the following business day.  We are closed weekends and major holidays. You have access to a nurse at all times for urgent questions. Please call the main number to the clinic [phone] and follow the prompts.  For any non-urgent questions, you may also contact your provider using MyChart. We now offer e-Visits for anyone 60 and older to request care online for non-urgent symptoms. For details visit [email or web address].   Also download the MyChart app! Go to the app store, search "MyChart", open the app, select [facility] [facility], and log in with your MyChart username and password.

## 2022-11-19 ENCOUNTER — Inpatient Hospital Stay: Payer: Managed Care, Other (non HMO)

## 2022-11-19 NOTE — Patient Instructions (Signed)
[facility]  Discharge Instructions: Thank you for choosing [facility] to provide your oncology and hematology care.  If you have a lab appointment with the [facility], please come in thru the Main Entrance and check in at the main information desk.  Wear comfortable clothing and clothing appropriate for easy access to any Portacath or PICC line.   We strive to give you quality time with your provider. You may need to reschedule your appointment if you arrive late (15 or more minutes).  Arriving late affects you and other patients whose appointments are after yours.  Also, if you miss three or more appointments without notifying the office, you may be dismissed from the clinic at the provider's discretion.      For prescription refill requests, have your pharmacy contact our office and allow 72 hours for refills to be completed.    Today you had a half phlebotomy done.    To help prevent nausea and vomiting after your treatment, we encourage you to take your nausea medication as directed.  BELOW ARE SYMPTOMS THAT SHOULD BE REPORTED IMMEDIATELY: *FEVER GREATER THAN 100.4 F (38 C) OR HIGHER *CHILLS OR SWEATING *NAUSEA AND VOMITING THAT IS NOT CONTROLLED WITH YOUR NAUSEA MEDICATION *UNUSUAL SHORTNESS OF BREATH *UNUSUAL BRUISING OR BLEEDING *URINARY PROBLEMS (pain or burning when urinating, or frequent urination) *BOWEL PROBLEMS (unusual diarrhea, constipation, pain near the anus) TENDERNESS IN MOUTH AND THROAT WITH OR WITHOUT PRESENCE OF ULCERS (sore throat, sores in mouth, or a toothache) UNUSUAL RASH, SWELLING OR PAIN  UNUSUAL VAGINAL DISCHARGE OR ITCHING   Items with * indicate a potential emergency and should be followed up as soon as possible or go to the Emergency Department if any problems should occur.  Please show the CHEMOTHERAPY ALERT CARD or IMMUNOTHERAPY ALERT CARD at check-in to the Emergency Department and triage nurse.  Should you  have questions after your visit or need to cancel or reschedule your appointment, please contact [facility] [phone]  and follow the prompts.  Office hours are 8:00 a.m. to 4:30 p.m. Monday - Friday. Please note that voicemails left after 4:00 p.m. may not be returned until the following business day.  We are closed weekends and major holidays. You have access to a nurse at all times for urgent questions. Please call the main number to the clinic [phone] and follow the prompts.  For any non-urgent questions, you may also contact your provider using MyChart. We now offer e-Visits for anyone 101 and older to request care online for non-urgent symptoms. For details visit [email or web address].   Also download the MyChart app! Go to the app store, search "MyChart", open the app, select [facility], and log in with your MyChart username and password.

## 2022-11-19 NOTE — Progress Notes (Signed)
Garrett Lin presents today for phlebotomy per MD orders. Phlebotomy procedure started at 1301 and ended at 1304. 250 mls removed. Patient observed for 5 minutes after procedure without any incident. Patient tolerated procedure well. IV needle removed intact.

## 2022-11-22 ENCOUNTER — Ambulatory Visit: Payer: Managed Care, Other (non HMO) | Admitting: Pulmonary Disease

## 2022-11-26 ENCOUNTER — Inpatient Hospital Stay: Payer: Managed Care, Other (non HMO)

## 2022-11-26 NOTE — Patient Instructions (Signed)
[facility]  Discharge Instructions: Thank you for choosing [facility] to provide your oncology and hematology care.  If you have a lab appointment with the Cancer Center, please come in thru the Main Entrance and check in at the main information desk.  Wear comfortable clothing and clothing appropriate for easy access to any Portacath or PICC line.   We strive to give you quality time with your provider. You may need to reschedule your appointment if you arrive late (15 or more minutes).  Arriving late affects you and other patients whose appointments are after yours.  Also, if you miss three or more appointments without notifying the office, you may be dismissed from the clinic at the provider's discretion.      For prescription refill requests, have your pharmacy contact our office and allow 72 hours for refills to be completed.     To help prevent nausea and vomiting after your treatment, we encourage you to take your nausea medication as directed.  BELOW ARE SYMPTOMS THAT SHOULD BE REPORTED IMMEDIATELY: *FEVER GREATER THAN 100.4 F (38 C) OR HIGHER *CHILLS OR SWEATING *NAUSEA AND VOMITING THAT IS NOT CONTROLLED WITH YOUR NAUSEA MEDICATION *UNUSUAL SHORTNESS OF BREATH *UNUSUAL BRUISING OR BLEEDING *URINARY PROBLEMS (pain or burning when urinating, or frequent urination) *BOWEL PROBLEMS (unusual diarrhea, constipation, pain near the anus) TENDERNESS IN MOUTH AND THROAT WITH OR WITHOUT PRESENCE OF ULCERS (sore throat, sores in mouth, or a toothache) UNUSUAL RASH, SWELLING OR PAIN  UNUSUAL VAGINAL DISCHARGE OR ITCHING   Items with * indicate a potential emergency and should be followed up as soon as possible or go to the Emergency Department if any problems should occur.  Please show the CHEMOTHERAPY ALERT CARD or IMMUNOTHERAPY ALERT CARD at check-in to the Emergency Department and triage nurse.  Should you have questions after your visit or need to  cancel or reschedule your appointment, please contact [facility] [phone]  and follow the prompts.  Office hours are 8:00 a.m. to 4:30 p.m. Monday - Friday. Please note that voicemails left after 4:00 p.m. may not be returned until the following business day.  We are closed weekends and major holidays. You have access to a nurse at all times for urgent questions. Please call the main number to the clinic [phone] and follow the prompts.  For any non-urgent questions, you may also contact your provider using MyChart. We now offer e-Visits for anyone 18 and older to request care online for non-urgent symptoms. For details visit [email or web address].   Also download the MyChart app! Go to the app store, search "MyChart", open the app, select [facility], and log in with your MyChart username and password.   

## 2022-11-26 NOTE — Progress Notes (Signed)
Patient presents today for therapeutic phlebotomy.  Patient is in satisfactory condition with no new complaints voiced.  Vital signs are stable.  We will proceed with phlebotomy per provider orders.  Phlebotomy start time was 1302 and end time was 1307.  Patient tolerated phlebotomy well with no complaint voiced.  Patient refused to wait the recommended 30 minute post phlebotomy wait time.  Patient left ambulatory in stable condition.

## 2022-12-02 ENCOUNTER — Other Ambulatory Visit: Payer: Self-pay

## 2022-12-03 ENCOUNTER — Inpatient Hospital Stay: Payer: Managed Care, Other (non HMO)

## 2022-12-03 ENCOUNTER — Inpatient Hospital Stay: Payer: Managed Care, Other (non HMO) | Attending: Hematology

## 2022-12-03 LAB — CBC
HCT: 43.6 % (ref 39.0–52.0)
Hemoglobin: 15.1 g/dL (ref 13.0–17.0)
MCH: 32.6 pg (ref 26.0–34.0)
MCHC: 34.6 g/dL (ref 30.0–36.0)
MCV: 94.2 fL (ref 80.0–100.0)
Platelets: 322 10*3/uL (ref 150–400)
RBC: 4.63 MIL/uL (ref 4.22–5.81)
RDW: 11.9 % (ref 11.5–15.5)
WBC: 8.9 10*3/uL (ref 4.0–10.5)
nRBC: 0 % (ref 0.0–0.2)

## 2022-12-03 NOTE — Patient Instructions (Signed)
[facility]  Discharge Instructions: Thank you for choosing [facility] to provide your oncology and hematology care.  If you have a lab appointment with the Cancer Center, please come in thru the Main Entrance and check in at the main information desk.  Wear comfortable clothing and clothing appropriate for easy access to any Portacath or PICC line.   We strive to give you quality time with your provider. You may need to reschedule your appointment if you arrive late (15 or more minutes).  Arriving late affects you and other patients whose appointments are after yours.  Also, if you miss three or more appointments without notifying the office, you may be dismissed from the clinic at the provider's discretion.      For prescription refill requests, have your pharmacy contact our office and allow 72 hours for refills to be completed.     To help prevent nausea and vomiting after your treatment, we encourage you to take your nausea medication as directed.  BELOW ARE SYMPTOMS THAT SHOULD BE REPORTED IMMEDIATELY: *FEVER GREATER THAN 100.4 F (38 C) OR HIGHER *CHILLS OR SWEATING *NAUSEA AND VOMITING THAT IS NOT CONTROLLED WITH YOUR NAUSEA MEDICATION *UNUSUAL SHORTNESS OF BREATH *UNUSUAL BRUISING OR BLEEDING *URINARY PROBLEMS (pain or burning when urinating, or frequent urination) *BOWEL PROBLEMS (unusual diarrhea, constipation, pain near the anus) TENDERNESS IN MOUTH AND THROAT WITH OR WITHOUT PRESENCE OF ULCERS (sore throat, sores in mouth, or a toothache) UNUSUAL RASH, SWELLING OR PAIN  UNUSUAL VAGINAL DISCHARGE OR ITCHING   Items with * indicate a potential emergency and should be followed up as soon as possible or go to the Emergency Department if any problems should occur.  Please show the CHEMOTHERAPY ALERT CARD or IMMUNOTHERAPY ALERT CARD at check-in to the Emergency Department and triage nurse.  Should you have questions after your visit or need to  cancel or reschedule your appointment, please contact [facility] [phone]  and follow the prompts.  Office hours are 8:00 a.m. to 4:30 p.m. Monday - Friday. Please note that voicemails left after 4:00 p.m. may not be returned until the following business day.  We are closed weekends and major holidays. You have access to a nurse at all times for urgent questions. Please call the main number to the clinic [phone] and follow the prompts.  For any non-urgent questions, you may also contact your provider using MyChart. We now offer e-Visits for anyone 18 and older to request care online for non-urgent symptoms. For details visit [email or web address].   Also download the MyChart app! Go to the app store, search "MyChart", open the app, select , and log in with your MyChart username and password.   

## 2022-12-03 NOTE — Progress Notes (Signed)
Patient presents today for therapeutic phlebotomy.  Patient is in satisfactory condition with no new complaints voiced.  Vital signs are stable.  Hemoglobin today is 15.1.  We will proceed with phlebotomy per provider orders.  Phlebotomy started at 1309 and ended at 1315.  Patient tolerated phlebotomy well with no complaints voiced.  Vital signs remained stable.  250 mL of blood was removed from right AC.  Patient refused the recommended 30 minute post phlebotomy wait time.  Patient left ambulatory in stable condition.

## 2022-12-10 ENCOUNTER — Inpatient Hospital Stay: Payer: Managed Care, Other (non HMO)

## 2022-12-10 NOTE — Progress Notes (Signed)
Eric Form presents today for phlebotomy per MD orders. Phlebotomy procedure started at 1304 and ended at 1308. 250 mls removed. Patient did not want to stay his wait time.  Patient tolerated procedure well. IV needle removed intact.

## 2022-12-10 NOTE — Patient Instructions (Signed)
[facility]  Discharge Instructions: Thank you for choosing [facility] to provide your oncology and hematology care.  If you have a lab appointment with the [facility], please come in thru the Main Entrance and check in at the main information desk.  Wear comfortable clothing and clothing appropriate for easy access to any Portacath or PICC line.   We strive to give you quality time with your provider. You may need to reschedule your appointment if you arrive late (15 or more minutes).  Arriving late affects you and other patients whose appointments are after yours.  Also, if you miss three or more appointments without notifying the office, you may be dismissed from the clinic at the provider's discretion.      For prescription refill requests, have your pharmacy contact our office and allow 72 hours for refills to be completed.    Today you received a phlebotomy.    To help prevent nausea and vomiting after your treatment, we encourage you to take your nausea medication as directed.  BELOW ARE SYMPTOMS THAT SHOULD BE REPORTED IMMEDIATELY: *FEVER GREATER THAN 100.4 F (38 C) OR HIGHER *CHILLS OR SWEATING *NAUSEA AND VOMITING THAT IS NOT CONTROLLED WITH YOUR NAUSEA MEDICATION *UNUSUAL SHORTNESS OF BREATH *UNUSUAL BRUISING OR BLEEDING *URINARY PROBLEMS (pain or burning when urinating, or frequent urination) *BOWEL PROBLEMS (unusual diarrhea, constipation, pain near the anus) TENDERNESS IN MOUTH AND THROAT WITH OR WITHOUT PRESENCE OF ULCERS (sore throat, sores in mouth, or a toothache) UNUSUAL RASH, SWELLING OR PAIN  UNUSUAL VAGINAL DISCHARGE OR ITCHING   Items with * indicate a potential emergency and should be followed up as soon as possible or go to the Emergency Department if any problems should occur.  Please show the CHEMOTHERAPY ALERT CARD or IMMUNOTHERAPY ALERT CARD at check-in to the Emergency Department and triage nurse.  Should you have  questions after your visit or need to cancel or reschedule your appointment, please contact [facility] [phone]  and follow the prompts.  Office hours are 8:00 a.m. to 4:30 p.m. Monday - Friday. Please note that voicemails left after 4:00 p.m. may not be returned until the following business day.  We are closed weekends and major holidays. You have access to a nurse at all times for urgent questions. Please call the main number to the clinic [phone] and follow the prompts.  For any non-urgent questions, you may also contact your provider using MyChart. We now offer e-Visits for anyone 40 and older to request care online for non-urgent symptoms. For details visit [email or web address].   Also download the MyChart app! Go to the app store, search "MyChart", open the app, select [facility], and log in with your MyChart username and password.

## 2022-12-13 ENCOUNTER — Inpatient Hospital Stay: Payer: Managed Care, Other (non HMO)

## 2022-12-13 ENCOUNTER — Other Ambulatory Visit: Payer: Self-pay | Admitting: *Deleted

## 2022-12-13 LAB — IRON AND TIBC
Iron: 34 ug/dL — ABNORMAL LOW (ref 45–182)
Saturation Ratios: 13 % — ABNORMAL LOW (ref 17.9–39.5)
TIBC: 254 ug/dL (ref 250–450)
UIBC: 220 ug/dL

## 2022-12-13 LAB — CBC WITH DIFFERENTIAL/PLATELET
Abs Immature Granulocytes: 0.01 10*3/uL (ref 0.00–0.07)
Basophils Absolute: 0 10*3/uL (ref 0.0–0.1)
Basophils Relative: 1 %
Eosinophils Absolute: 0 10*3/uL (ref 0.0–0.5)
Eosinophils Relative: 1 %
HCT: 47.1 % (ref 39.0–52.0)
Hemoglobin: 15.9 g/dL (ref 13.0–17.0)
Immature Granulocytes: 0 %
Lymphocytes Relative: 25 %
Lymphs Abs: 1.1 10*3/uL (ref 0.7–4.0)
MCH: 32.5 pg (ref 26.0–34.0)
MCHC: 33.8 g/dL (ref 30.0–36.0)
MCV: 96.3 fL (ref 80.0–100.0)
Monocytes Absolute: 0.8 10*3/uL (ref 0.1–1.0)
Monocytes Relative: 19 %
Neutro Abs: 2.4 10*3/uL (ref 1.7–7.7)
Neutrophils Relative %: 54 %
Platelets: 219 10*3/uL (ref 150–400)
RBC: 4.89 MIL/uL (ref 4.22–5.81)
RDW: 11.8 % (ref 11.5–15.5)
WBC: 4.4 10*3/uL (ref 4.0–10.5)
nRBC: 0 % (ref 0.0–0.2)

## 2022-12-13 LAB — FERRITIN: Ferritin: 155 ng/mL (ref 24–336)

## 2022-12-13 NOTE — Progress Notes (Signed)
Lab changed to CBCD, iron and Ferritin per Tarri Abernethy, PAC.

## 2022-12-13 NOTE — Progress Notes (Signed)
Patient called requesting recheck of CBC.  States that he has felt unwell in general since phlebotomy past Friday and has been very cold.  Order placed for 1 x CBC and Rebekah Pennington - PAC notified.

## 2022-12-17 ENCOUNTER — Inpatient Hospital Stay: Payer: Managed Care, Other (non HMO)

## 2022-12-24 ENCOUNTER — Inpatient Hospital Stay: Payer: Managed Care, Other (non HMO)

## 2022-12-31 ENCOUNTER — Inpatient Hospital Stay: Payer: Managed Care, Other (non HMO)

## 2023-01-07 ENCOUNTER — Inpatient Hospital Stay: Payer: Managed Care, Other (non HMO)

## 2023-01-07 ENCOUNTER — Inpatient Hospital Stay: Payer: Managed Care, Other (non HMO) | Attending: Hematology | Admitting: Physician Assistant

## 2023-01-07 DIAGNOSIS — F17210 Nicotine dependence, cigarettes, uncomplicated: Secondary | ICD-10-CM | POA: Diagnosis not present

## 2023-01-07 DIAGNOSIS — Z79899 Other long term (current) drug therapy: Secondary | ICD-10-CM | POA: Insufficient documentation

## 2023-01-07 LAB — COMPREHENSIVE METABOLIC PANEL
ALT: 15 U/L (ref 0–44)
AST: 16 U/L (ref 15–41)
Albumin: 4.2 g/dL (ref 3.5–5.0)
Alkaline Phosphatase: 81 U/L (ref 38–126)
Anion gap: 8 (ref 5–15)
BUN: 17 mg/dL (ref 6–20)
CO2: 27 mmol/L (ref 22–32)
Calcium: 9.4 mg/dL (ref 8.9–10.3)
Chloride: 101 mmol/L (ref 98–111)
Creatinine, Ser: 0.99 mg/dL (ref 0.61–1.24)
GFR, Estimated: >60 mL/min (ref >60)
Glucose, Bld: 108 mg/dL — ABNORMAL HIGH (ref 70–99)
Potassium: 5 mmol/L (ref 3.5–5.1)
Sodium: 136 mmol/L (ref 135–145)
Total Bilirubin: 0.7 mg/dL (ref 0.3–1.2)
Total Protein: 7.6 g/dL (ref 6.5–8.1)

## 2023-01-07 LAB — CBC WITH DIFFERENTIAL/PLATELET
Abs Immature Granulocytes: 0.02 10*3/uL (ref 0.00–0.07)
Basophils Absolute: 0.1 10*3/uL (ref 0.0–0.1)
Basophils Relative: 1 %
Eosinophils Absolute: 0.1 10*3/uL (ref 0.0–0.5)
Eosinophils Relative: 2 %
HCT: 49.5 % (ref 39.0–52.0)
Hemoglobin: 17.3 g/dL — ABNORMAL HIGH (ref 13.0–17.0)
Immature Granulocytes: 0 %
Lymphocytes Relative: 26 %
Lymphs Abs: 1.9 10*3/uL (ref 0.7–4.0)
MCH: 32.5 pg (ref 26.0–34.0)
MCHC: 34.9 g/dL (ref 30.0–36.0)
MCV: 93 fL (ref 80.0–100.0)
Monocytes Absolute: 0.6 10*3/uL (ref 0.1–1.0)
Monocytes Relative: 7 %
Neutro Abs: 4.8 10*3/uL (ref 1.7–7.7)
Neutrophils Relative %: 64 %
Platelets: 287 10*3/uL (ref 150–400)
RBC: 5.32 MIL/uL (ref 4.22–5.81)
RDW: 11.3 % — ABNORMAL LOW (ref 11.5–15.5)
WBC: 7.5 10*3/uL (ref 4.0–10.5)
nRBC: 0 % (ref 0.0–0.2)

## 2023-01-07 LAB — IRON AND TIBC
Iron: 121 ug/dL (ref 45–182)
Saturation Ratios: 43 % — ABNORMAL HIGH (ref 17.9–39.5)
TIBC: 284 ug/dL (ref 250–450)
UIBC: 163 ug/dL

## 2023-01-07 LAB — FERRITIN: Ferritin: 95 ng/mL (ref 24–336)

## 2023-01-07 NOTE — Patient Instructions (Signed)
[facility] **VISIT SUMMARY & IMPORTANT INSTRUCTIONS **   You were seen today by Rojelio Brenner PA-C for your hereditary hemochromatosis.   Your iron levels are finally starting to get low enough that we can cut back on how often you need phlebotomy! Our goal is for your ferritin ("savings account iron") to be less than 50.  Your ferritin today was 95. We will schedule you for phlebotomy once a month for the next 4 months.  We will recheck your blood and iron levels again in 4 months.  FOLLOW-UP APPOINTMENT: Office visit in 4 months  ** Thank you for trusting me with your healthcare!  I strive to provide all of my patients with quality care at each visit.  If you receive a survey for this visit, I would be so grateful to you for taking the time to provide feedback.  Thank you in advance!  ~ Manessa Buley                   Dr. Doreatha Massed   &   Rojelio Brenner, PA-C   - - - - - - - - - - - - - - - - - -    Thank you for choosing [facility] at [facility] to provide your oncology and hematology care.  To afford each patient quality time with our provider, please arrive at least 15 minutes before your scheduled appointment time.   If you have a lab appointment with the Cancer Center please come in thru the Main Entrance and check in at the main information desk.  You need to re-schedule your appointment should you arrive 10 or more minutes late.  We strive to give you quality time with our providers, and arriving late affects you and other patients whose appointments are after yours.  Also, if you no show three or more times for appointments you may be dismissed from the clinic at the providers discretion.     Again, thank you for choosing [facility].  Our hope is that these requests will decrease the amount of time that you wait before being seen by our physicians.        _____________________________________________________________  Should you have questions after your visit to [facility], please contact our office at [phone] and follow the prompts.  Our office hours are 8:00 a.m. and 4:30 p.m. Monday - Friday.  Please note that voicemails left after 4:00 p.m. may not be returned until the following business day.  We are closed weekends and major holidays.  You do have access to a nurse 24-7, just call the main number to the clinic [phone] and do not press any options, hold on the line and a nurse will answer the phone.    For prescription refill requests, have your pharmacy contact our office and allow 72 hours.

## 2023-01-07 NOTE — Progress Notes (Signed)
[facility] [address] [place] , [place] [address]   CLINIC:  Medical Oncology/Hematology  PCP:  Gwenlyn FudgeJoyce, Britney F, FNP [address] [place] [id number] [phone]   REASON FOR VISIT:  Follow-up for hereditary hemochromatosis   PRIOR THERAPY: Weekly phlebotomy (500 mL each)   CURRENT THERAPY: Weekly phlebotomy (250 mL each)  INTERVAL HISTORY:   [name] 62 y.o. male returns for routine follow-up of hereditary hemochromatosis.  He was last seen by NP Consuello MasseLauren Allen on 10/08/2022.  Patient reports increased fatigue today, reports that he has been feeling this way ever since his phlebotomy on 12/10/2022.  Reports that appetite is fair.  He is no longer having pain after eating.  Decreased joint pain and skin bronzing.  He reports 25% energy and 75% appetite.  He is maintaining a stable weight.  ASSESSMENT & PLAN:  1.  Hereditary hemochromatosis (C282Y homozygous) - Severe hepatic iron deposition was incidental finding on recent MRI abdomen (06/11/2021), which was obtained by PCP for monitoring of previously noted hepatic cyst. - MRI abdomen (06/11/2021) showed severe diffuse hepatic iron deposition, as well as tiny < 5 mm cyst which correlates with lesion seen on prior CT; no evidence of hepatic neoplasm. - Hemochromatosis DNA testing shows homozygosity for C282Y HFE gene (07/23/2021).  No abnormalities on hepatic function panel.  Viral hepatitis panel was negative. - Cardiac MRI (09/16/2021) was negative for cardiac hemochromatosis. - At presentation, he had symptomatic iron overload with fatigue, joint pain, and abdominal pain after eating    - Discussed  course of untreated hereditary hemochromatosis with risk of cirrhosis, hepatocellular carcinoma, heart failure, and arrhythmia.  Iron overload also increases risk of certain bacterial infections. - We have discussed lifestyle modifications such as avoiding alcohol and iron supplementation.  Recent guidelines do not  find that moderate red meat intake adversely affects patients with hemochromatosis.  Patient should avoid raw fish or undercooked meat due to risk of certain bacterial infections to thrive and iron rich environments. - We have discussed phlebotomy protocol for treatment of hemochromatosis: Weekly phlebotomy as tolerated until goal ferritin is reached. If Hgb drops < 11.0, we will decrease frequency of phlebotomy. If patient experiences severe side effects or hemodynamic instability, we will decrease amount taken for phlebotomy. Goal is ferritin < 50 to reverse hepatic iron deposition. Once iron stores have normalized, we will decrease frequency of phlebotomy. - Initially started on weekly 500 mL phlebotomy, decreased to weekly 250 mL phlebotomy due to significant fatigue after full phlebotomy. - Most recent labs (01/07/2023): Hgb 17.3/HCT 49.5, ferritin 95, iron saturation 43 %.  CMP normal. - PLAN: Patient is starting to feel increased fatigue as his body adjusts to lower iron levels.  Now the ferritin is <100, we will cut back to phlebotomy (250 mL) once a month. - Labs (CBC, CMP, iron panel) and office visit in 4 months - Recommend genetic testing of first-degree relatives (parents, siblings, children), as early detection and treatment can help prevent complications. - We will defer GI referral for now, since there were no signs of liver damage apart from iron overload on imaging or labs.   2.  Tobacco abuse - Patient has been smoking 1.0-1.5 PPD since age 62. - This patient meets criteria for low-dose CT lung cancer screening (age 60-80 with a 20+ pack year history, current everyday smoker /OR/ quit < 15 years ago, no current signs or symptoms of lung cancer) - Shared decision making visit completed on 11/06/2021 -  Low-dose CT chest (03/05/2022): Pulmonary nodules, lung RADS 2 (benign appearance or behavior); partially calcified bilateral pleural plaques indicative of asbestos-related pleural  disease, aortic atherosclerosis, emphysema, coronary artery disease - PLAN: Continue annual LDCT scans (next due June 2024) - Patient can follow-up with PCP regarding aortic atherosclerosis and coronary artery atherosclerosis (incidental findings). - We will refer to pulmonology due to pleural plaques indicative of asbestos related disease as well as findings consistent with emphysema. - Once ferritin <50, will transition to outpatient blood donation once every 3 months with annual hematology follow-up   3.  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 - SUBSTANCE: He smokes 1.0-1.5 PPD cigarettes since age 81.  He drinks socially, usually 1 or 2 mixed drinks once or twice a year.  He has a previous history of heavy alcohol use, but quit 26 years ago (age 32).  He denies any illicit drug use. - FAMILY: No family history of liver disease, hemochromatosis, blood disorders, or iron disorders.  Patient's mother had breast cancer.   PLAN SUMMARY: >> Monthly phlebotomy (250 mL) >> Labs in 4 months = CBC/D, CMP, ferritin, iron/TIBC >> OFFICE visit in 4 months (after labs)     REVIEW OF SYSTEMS:   Review of Systems  Constitutional:  Positive for fatigue. Negative for appetite change, chills, diaphoresis, fever and unexpected weight change.  HENT:   Negative for lump/mass and nosebleeds.   Eyes:  Negative for eye problems.  Respiratory:  Negative for cough, hemoptysis and shortness of breath.   Cardiovascular:  Negative for chest pain, leg swelling and palpitations.  Gastrointestinal:  Negative for abdominal pain, blood in stool, constipation, diarrhea, nausea and vomiting.  Genitourinary:  Negative for hematuria.   Musculoskeletal:  Positive for arthralgias.  Skin: Negative.   Neurological:  Negative for  dizziness, headaches and light-headedness.  Hematological:  Does not bruise/bleed easily.     PHYSICAL EXAM:  ECOG PERFORMANCE STATUS: 1 - Symptomatic but completely ambulatory  There were no vitals filed for this visit. There were no vitals filed for this visit. Physical Exam Constitutional:      Appearance: Normal appearance. He is normal weight.  Cardiovascular:     Heart sounds: Normal heart sounds.  Pulmonary:     Breath sounds: Normal breath sounds.  Neurological:     General: No focal deficit present.     Mental Status: Mental status is at baseline.  Psychiatric:        Behavior: Behavior normal. Behavior is cooperative.     PAST MEDICAL/SURGICAL HISTORY:  Past Medical History:  Diagnosis Date   Chronic pain    right forearm.   Hereditary hemochromatosis (HCC) 08/07/2021   Irritable bowel syndrome (IBS) 2006, 2007   "nervous stomach" abdominal pain   Small bowel obstruction due to adhesions (HCC) 09/02/2013   Past Surgical History:  Procedure Laterality Date   COLONOSCOPY  2006   Carman Ching MD   COLONOSCOPY WITH PROPOFOL N/A 08/11/2021   Procedure: COLONOSCOPY WITH PROPOFOL;  Surgeon: Lanelle Bal, DO;  [facility];  Service: Endoscopy;  Laterality: N/A;  9:00 / ASA II   ESOPHAGOGASTRODUODENOSCOPY  2006   Raymondo Band MD   LAPAROSCOPIC LYSIS OF ADHESIONS N/A 09/02/2013   Procedure: LAPAROSCOPIC LYSIS OF ADHESIONS ;  Surgeon: [name], MD;  Location: [facility] OR;  Service: General;  Laterality: N/A;   SHOULDER ARTHROSCOPY Right 04/2014   Dr. Ranell Patrick   surgery for right forarm trauma     work related machine injury    SOCIAL HISTORY:  Social History   Socioeconomic History   Marital status: Married    Spouse name: Not on file   Number of children: Not on file   Years of education: Not on file   Highest education level: Not on file  Occupational History   Not on file  Tobacco Use   Smoking status: Every Day    Packs/day: 1.00     Years: 38.00    Additional pack years: 0.00    Total pack years: 38.00    Types: Cigarettes   Smokeless tobacco: Never  Vaping Use   Vaping Use: Never used  Substance and Sexual Activity   Alcohol use: No   Drug use: No   Sexual activity: Not on file  Other Topics Concern   Not on file  Social History Narrative   Not on file   Social Determinants of Health   Financial Resource Strain: Not on file  Food Insecurity: Not on file  Transportation Needs: Not on file  Physical Activity: Not on file  Stress: Not on file  Social Connections: Not on file  Intimate Partner Violence: Not on file    FAMILY HISTORY:  Family History  Problem Relation Age of Onset   Ovarian cancer Sister     CURRENT MEDICATIONS:  Outpatient Encounter Medications as of 01/07/2023  Medication Sig   Aspirin-Acetaminophen-Caffeine (GOODY HEADACHE PO) Take 1 packet by mouth daily as needed (pain).   CLENPIQ 10-3.5-12 MG-GM -GM/160ML SOLN SMARTSIG:320 Milliliter(s) By Mouth As Directed   meloxicam (MOBIC) 7.5 MG tablet Take 1 tablet (7.5 mg total) by mouth daily.   promethazine-dextromethorphan (PROMETHAZINE-DM) 6.25-15 MG/5ML syrup Take 5 mLs by mouth 4 (four) times daily as needed for cough.   rosuvastatin (CRESTOR) 5 MG tablet Take 1 tablet (5 mg total) by mouth daily.   No facility-administered encounter medications on file as of 01/07/2023.    ALLERGIES:  No Known Allergies  LABORATORY DATA:  I have reviewed the labs as listed.  CBC    Component Value Date/Time   WBC 4.4 12/13/2022 1309   RBC 4.89 12/13/2022 1309   HGB 15.9 12/13/2022 1309   HGB 17.3 05/13/2021 0954   HCT 47.1 12/13/2022 1309   HCT 50.8 05/13/2021 0954   PLT 219 12/13/2022 1309   PLT 325 05/13/2021 0954   MCV 96.3 12/13/2022 1309   MCV 93 05/13/2021 0954   MCH 32.5 12/13/2022 1309   MCHC 33.8 12/13/2022 1309   RDW 11.8 12/13/2022 1309   RDW 12.1 05/13/2021 0954   LYMPHSABS 1.1 12/13/2022 1309   LYMPHSABS 2.0 05/13/2021  0954   MONOABS 0.8 12/13/2022 1309   EOSABS 0.0 12/13/2022 1309   EOSABS 0.2 05/13/2021 0954   BASOSABS 0.0 12/13/2022 1309   BASOSABS 0.1 05/13/2021 0954      Latest Ref Rng & Units 10/08/2022    8:12 AM 06/25/2022    9:36 AM 07/23/2021   10:11 AM  CMP  Glucose 70 - 99 mg/dL 956  90    BUN 6 - 20 mg/dL 15  20    Creatinine 2.13 - 1.24 mg/dL 0.86  5.78    Sodium 469 - 145 mmol/L 135  137    Potassium 3.5 - 5.1 mmol/L 3.9  4.3    Chloride 98 - 111 mmol/L 104  103    CO2 22 - 32 mmol/L 25  25    Calcium 8.9 - 10.3 mg/dL 9.2  9.4    Total Protein 6.5 - 8.1 g/dL 7.4  7.9  7.3   Total Bilirubin 0.3 - 1.2 mg/dL 0.7  0.7  0.9   Alkaline Phos 38 - 126 U/L 84  86  75   AST 15 - 41 U/L 17  16  26    ALT 0 - 44 U/L 13  15  32     DIAGNOSTIC IMAGING:  I have independently reviewed the relevant imaging and discussed with the patient.   WRAP UP:  All questions were answered. The patient knows to call the clinic with any problems, questions or concerns.  Medical decision making: Moderate  Time spent on visit: I spent 20 minutes counseling the patient face to face. The total time spent in the appointment was 30 minutes and more than 50% was on counseling.  Carnella GuadalajaraRebekah M Kaily Wragg, PA-C  01/07/23 11:50 AM

## 2023-01-07 NOTE — Progress Notes (Signed)
No phlebotomy today per Rebekah Pennington-PA-C. 

## 2023-01-10 ENCOUNTER — Other Ambulatory Visit: Payer: Self-pay

## 2023-01-21 ENCOUNTER — Inpatient Hospital Stay: Payer: Managed Care, Other (non HMO)

## 2023-01-21 NOTE — Progress Notes (Signed)
Patient presents today for therapeutic phlebotomy.  Patient is in satisfactory condition with no new complaints voiced.  Vital signs are stable.  We will proceed with phlebotomy per provider orders.  Therapeutic phlebotomy started at 0904 and ended at 0907.  of blood removed from right arm.  Vital signs remained stable. Patient denies any dizziness. Coban applied  Patient refused to wait recommended 30 minute post phlebotomy wait time.  Patient left ambulatory in stable condition.

## 2023-01-21 NOTE — Patient Instructions (Signed)
[facility]  Discharge Instructions: Thank you for choosing [facility] to provide your oncology and hematology care.  If you have a lab appointment with the Cancer Center - please note that after April 8th, 2024, all labs will be drawn in the cancer center.  You do not have to check in or register with the main entrance as you have in the past but will complete your check-in in the cancer center.  Wear comfortable clothing and clothing appropriate for easy access to any Portacath or PICC line.   We strive to give you quality time with your provider. You may need to reschedule your appointment if you arrive late (15 or more minutes).  Arriving late affects you and other patients whose appointments are after yours.  Also, if you miss three or more appointments without notifying the office, you may be dismissed from the clinic at the provider's discretion.      For prescription refill requests, have your pharmacy contact our office and allow 72 hours for refills to be completed.    Today you received the following phlebotomy.    To help prevent nausea and vomiting after your treatment, we encourage you to take your nausea medication as directed.  BELOW ARE SYMPTOMS THAT SHOULD BE REPORTED IMMEDIATELY: *FEVER GREATER THAN 100.4 F (38 C) OR HIGHER *CHILLS OR SWEATING *NAUSEA AND VOMITING THAT IS NOT CONTROLLED WITH YOUR NAUSEA MEDICATION *UNUSUAL SHORTNESS OF BREATH *UNUSUAL BRUISING OR BLEEDING *URINARY PROBLEMS (pain or burning when urinating, or frequent urination) *BOWEL PROBLEMS (unusual diarrhea, constipation, pain near the anus) TENDERNESS IN MOUTH AND THROAT WITH OR WITHOUT PRESENCE OF ULCERS (sore throat, sores in mouth, or a toothache) UNUSUAL RASH, SWELLING OR PAIN  UNUSUAL VAGINAL DISCHARGE OR ITCHING   Items with * indicate a potential emergency and should be followed up as soon as possible or go to the Emergency Department if any problems  should occur.  Please show the CHEMOTHERAPY ALERT CARD or IMMUNOTHERAPY ALERT CARD at check-in to the Emergency Department and triage nurse.  Should you have questions after your visit or need to cancel or reschedule your appointment, please contact [facility] AT  [phone]  and follow the prompts.  Office hours are 8:00 a.m. to 4:30 p.m. Monday - Friday. Please note that voicemails left after 4:00 p.m. may not be returned until the following business day.  We are closed weekends and major holidays. You have access to a nurse at all times for urgent questions. Please call the main number to the clinic [phone] and follow the prompts.  For any non-urgent questions, you may also contact your provider using MyChart. We now offer e-Visits for anyone 18 and older to request care online for non-urgent symptoms. For details visit [email or web address].   Also download the MyChart app! Go to the app store, search "MyChart", open the app, select [place], and log in with your MyChart username and password.   

## 2023-01-26 ENCOUNTER — Other Ambulatory Visit: Payer: Self-pay

## 2023-01-26 DIAGNOSIS — Z122 Encounter for screening for malignant neoplasm of respiratory organs: Secondary | ICD-10-CM

## 2023-01-26 DIAGNOSIS — Z87891 Personal history of nicotine dependence: Secondary | ICD-10-CM

## 2023-01-26 NOTE — Progress Notes (Signed)
LDCT order placed per protocol °

## 2023-02-11 ENCOUNTER — Inpatient Hospital Stay: Payer: Managed Care, Other (non HMO) | Attending: Hematology

## 2023-02-11 NOTE — Progress Notes (Signed)
Garrett Lin presents today for theraputic phlebotomy per MD orders. Last hgb 17.3/hct 49.5 was on 01/07/23 VSS prior to procedure. Pt reports eating before arrival. Procedure started at 1347 using patients right AC. 250 mL of blood removed. Procedure ended at 1348. Gauze and coban applied to [facility], site clean and dry. VSS upon completion of procedure. Pt denies dizziness, lightheadedness, or feeling faint. Discharged in satisfactory condition with follow up instructions.

## 2023-02-11 NOTE — Patient Instructions (Signed)
[facility]  Discharge Instructions: Thank you for choosing [facility] to provide your oncology and hematology care.  If you have a lab appointment with the Cancer Center - please note that after April 8th, 2024, all labs will be drawn in the cancer center.  You do not have to check in or register with the main entrance as you have in the past but will complete your check-in in the cancer center.  Wear comfortable clothing and clothing appropriate for easy access to any Portacath or PICC line.   We strive to give you quality time with your provider. You may need to reschedule your appointment if you arrive late (15 or more minutes).  Arriving late affects you and other patients whose appointments are after yours.  Also, if you miss three or more appointments without notifying the office, you may be dismissed from the clinic at the provider's discretion.      For prescription refill requests, have your pharmacy contact our office and allow 72 hours for refills to be completed.    Today you received therapeutic phlebotomy   BELOW ARE SYMPTOMS THAT SHOULD BE REPORTED IMMEDIATELY: *FEVER GREATER THAN 100.4 F (38 C) OR HIGHER *CHILLS OR SWEATING *NAUSEA AND VOMITING THAT IS NOT CONTROLLED WITH YOUR NAUSEA MEDICATION *UNUSUAL SHORTNESS OF BREATH *UNUSUAL BRUISING OR BLEEDING *URINARY PROBLEMS (pain or burning when urinating, or frequent urination) *BOWEL PROBLEMS (unusual diarrhea, constipation, pain near the anus) TENDERNESS IN MOUTH AND THROAT WITH OR WITHOUT PRESENCE OF ULCERS (sore throat, sores in mouth, or a toothache) UNUSUAL RASH, SWELLING OR PAIN  UNUSUAL VAGINAL DISCHARGE OR ITCHING   Items with * indicate a potential emergency and should be followed up as soon as possible or go to the Emergency Department if any problems should occur.  Please show the CHEMOTHERAPY ALERT CARD or IMMUNOTHERAPY ALERT CARD at check-in to the Emergency Department and  triage nurse.  Should you have questions after your visit or need to cancel or reschedule your appointment, please contact [facility] [phone]  and follow the prompts.  Office hours are 8:00 a.m. to 4:30 p.m. Monday - Friday. Please note that voicemails left after 4:00 p.m. may not be returned until the following business day.  We are closed weekends and major holidays. You have access to a nurse at all times for urgent questions. Please call the main number to the clinic [phone] and follow the prompts.  For any non-urgent questions, you may also contact your provider using MyChart. We now offer e-Visits for anyone 55 and older to request care online for non-urgent symptoms. For details visit [email or web address].   Also download the MyChart app! Go to the app store, search "MyChart", open the app, select , and log in with your MyChart username and password.

## 2023-03-18 ENCOUNTER — Ambulatory Visit (INDEPENDENT_AMBULATORY_CARE_PROVIDER_SITE_OTHER): Payer: Managed Care, Other (non HMO) | Admitting: Family Medicine

## 2023-03-18 ENCOUNTER — Inpatient Hospital Stay: Payer: Managed Care, Other (non HMO) | Attending: Hematology

## 2023-03-18 ENCOUNTER — Other Ambulatory Visit: Payer: Self-pay

## 2023-03-18 VITALS — BP 130/85 | HR 69 | Temp 97.9°F | Ht 70.0 in | Wt 149.2 lb

## 2023-03-18 DIAGNOSIS — I700 Atherosclerosis of aorta: Secondary | ICD-10-CM

## 2023-03-18 DIAGNOSIS — M25511 Pain in right shoulder: Secondary | ICD-10-CM | POA: Diagnosis not present

## 2023-03-18 DIAGNOSIS — G8929 Other chronic pain: Secondary | ICD-10-CM

## 2023-03-18 DIAGNOSIS — M25512 Pain in left shoulder: Secondary | ICD-10-CM

## 2023-03-18 MED ORDER — MELOXICAM 15 MG PO TABS
15.0000 mg | ORAL_TABLET | Freq: Every day | ORAL | 1 refills | Status: DC
Start: 2023-03-18 — End: 2023-03-18

## 2023-03-18 MED ORDER — MELOXICAM 15 MG PO TABS
15.0000 mg | ORAL_TABLET | Freq: Every day | ORAL | 1 refills | Status: DC
Start: 2023-03-18 — End: 2023-10-06

## 2023-03-18 MED ORDER — ROSUVASTATIN CALCIUM 5 MG PO TABS
5.0000 mg | ORAL_TABLET | Freq: Every day | ORAL | 3 refills | Status: DC
Start: 2023-03-18 — End: 2023-03-18

## 2023-03-18 MED ORDER — ROSUVASTATIN CALCIUM 5 MG PO TABS
5.0000 mg | ORAL_TABLET | Freq: Every day | ORAL | 3 refills | Status: DC
Start: 2023-03-18 — End: 2024-04-26

## 2023-03-18 NOTE — Progress Notes (Signed)
Garrett Lin presents today for phlebotomy per MD orders. Phlebotomy procedure started at 1398 and ended at 1401. 250 cc removed. Patient tolerated procedure well. IV needle removed intact.

## 2023-03-18 NOTE — Patient Instructions (Signed)
[facility]  Discharge Instructions: Thank you for choosing [facility] to provide your oncology and hematology care.  If you have a lab appointment with the Cancer Center - please note that after April 8th, 2024, all labs will be drawn in the cancer center.  You do not have to check in or register with the main entrance as you have in the past but will complete your check-in in the cancer center.  Wear comfortable clothing and clothing appropriate for easy access to any Portacath or PICC line.   We strive to give you quality time with your provider. You may need to reschedule your appointment if you arrive late (15 or more minutes).  Arriving late affects you and other patients whose appointments are after yours.  Also, if you miss three or more appointments without notifying the office, you may be dismissed from the clinic at the provider's discretion.      For prescription refill requests, have your pharmacy contact our office and allow 72 hours for refills to be completed.    Today you received the following Phlebotomy, return as scheduled.   To help prevent nausea and vomiting after your treatment, we encourage you to take your nausea medication as directed.  BELOW ARE SYMPTOMS THAT SHOULD BE REPORTED IMMEDIATELY: *FEVER GREATER THAN 100.4 F (38 C) OR HIGHER *CHILLS OR SWEATING *NAUSEA AND VOMITING THAT IS NOT CONTROLLED WITH YOUR NAUSEA MEDICATION *UNUSUAL SHORTNESS OF BREATH *UNUSUAL BRUISING OR BLEEDING *URINARY PROBLEMS (pain or burning when urinating, or frequent urination) *BOWEL PROBLEMS (unusual diarrhea, constipation, pain near the anus) TENDERNESS IN MOUTH AND THROAT WITH OR WITHOUT PRESENCE OF ULCERS (sore throat, sores in mouth, or a toothache) UNUSUAL RASH, SWELLING OR PAIN  UNUSUAL VAGINAL DISCHARGE OR ITCHING   Items with * indicate a potential emergency and should be followed up as soon as possible or go to the Emergency Department  if any problems should occur.  Please show the CHEMOTHERAPY ALERT CARD or IMMUNOTHERAPY ALERT CARD at check-in to the Emergency Department and triage nurse.  Should you have questions after your visit or need to cancel or reschedule your appointment, please contact [facility] CENTER AT [facility] [phone]  and follow the prompts.  Office hours are 8:00 a.m. to 4:30 p.m. Monday - Friday. Please note that voicemails left after 4:00 p.m. may not be returned until the following business day.  We are closed weekends and major holidays. You have access to a nurse at all times for urgent questions. Please call the main number to the clinic [phone] and follow the prompts.  For any non-urgent questions, you may also contact your provider using MyChart. We now offer e-Visits for anyone 20 and older to request care online for non-urgent symptoms. For details visit [email or web address].   Also download the MyChart app! Go to the app store, search "MyChart", open the app, select [place], and log in with your MyChart username and password.

## 2023-03-18 NOTE — Progress Notes (Signed)
Acute Office Visit  Subjective:     Patient ID: Garrett Lin, male    DOB: 09/28/1961, 62 y.o.   MRN: [id number]  Chief Complaint  Patient presents with   Shoulder Pain    Both shoulders hurting. Says it is due to arthritis. Has used biofreeze and essential oil. Started new job and has been making shoulder pain worse.     Shoulder Pain    Patient is in today for bilateral chronic shoulder pain. He has a history of arthritis in both of these shoulder that was dx by ortho years ago. He recently started a new job that involved heavy lifting and repeative movements of his arms. This has flared up his pain. It is a constant achy pain that is worse with activity. Denies decreased ROM, numbness or tingling. He is interested in meloxicam. A friend at work takes this with good relief and he has done some research on meloxicam.    He would also like to restart his statin. He was started on this for plaque buildup in his heart. He eats a regular diet. Has a strenuous job. Denies chest pain, shortness of breath, edema, dizziness, palpitations.   He follows up regularly with hematology for hemachromatosis. He has blood drawn off monthly and labs every few months. Reports that this has been stable. Denies abdominal pain, nausea, vomiting, jaundice.   ROS As per HPI.      Objective:    BP 130/85   Pulse 69   Temp 97.9 F (36.6 C) (Temporal)   Ht 5\' 10"  (1.778 m)   Wt 149 lb 3.2 oz (67.7 kg)   SpO2 97%   BMI 21.41 kg/m    Physical Exam Vitals and nursing note reviewed.  Constitutional:      General: He is not in acute distress.    Appearance: Normal appearance. He is not ill-appearing.  Cardiovascular:     Rate and Rhythm: Normal rate and regular rhythm.     Pulses: Normal pulses.     Heart sounds: Normal heart sounds. No murmur heard. Pulmonary:     Effort: Pulmonary effort is normal. No respiratory distress.     Breath sounds: Normal breath sounds.  Abdominal:     General:  Bowel sounds are normal. There is no distension.     Palpations: Abdomen is soft. There is no mass.     Tenderness: There is no abdominal tenderness. There is no guarding or rebound.  Musculoskeletal:     Right shoulder: Tenderness present. No swelling, deformity, effusion, bony tenderness or crepitus. Normal range of motion. Normal strength.     Left shoulder: Tenderness present. No swelling, deformity, effusion, bony tenderness or crepitus. Normal range of motion. Normal strength.     Cervical back: Neck supple. No tenderness.     Right lower leg: No edema.     Left lower leg: No edema.  Lymphadenopathy:     Cervical: No cervical adenopathy.  Skin:    General: Skin is warm and dry.  Neurological:     General: No focal deficit present.     Mental Status: He is alert and oriented to person, place, and time.  Psychiatric:        Mood and Affect: Mood normal.        Behavior: Behavior normal.     No results found for any visits on 03/18/23.      Assessment & Plan:   Garrett Lin was seen today for shoulder pain.  Diagnoses  and all orders for this visit:  Chronic pain of both shoulders Uncontrolled with new job. Start mobic as below. Reviewed recent GFR. Do not take other NSAIDs with mobic.  -     meloxicam (MOBIC) 15 MG tablet; Take 1 tablet (15 mg total) by mouth daily.  Aortic atherosclerosis (HCC) Restart crestor. Prescription given for lipid panel to be done with next lab draw from hematology.  -     rosuvastatin (CRESTOR) 5 MG tablet; Take 1 tablet (5 mg total) by mouth daily.  Hereditary hemochromatosis (HCC) Managed by hematology. Stable.    Return in about 6 months (around 09/17/2023) for CPE.  The patient indicates understanding of these issues and agrees with the plan.  Gabriel Earing, FNP

## 2023-04-08 ENCOUNTER — Ambulatory Visit (HOSPITAL_COMMUNITY)
Admission: RE | Admit: 2023-04-08 | Discharge: 2023-04-08 | Disposition: A | Discharge status: Home / Self Care | Payer: Managed Care, Other (non HMO) | Source: Ambulatory Visit | Attending: Physician Assistant | Admitting: Physician Assistant

## 2023-04-08 ENCOUNTER — Inpatient Hospital Stay: Payer: Managed Care, Other (non HMO) | Attending: Hematology

## 2023-04-08 DIAGNOSIS — Z87891 Personal history of nicotine dependence: Secondary | ICD-10-CM

## 2023-04-08 DIAGNOSIS — Z122 Encounter for screening for malignant neoplasm of respiratory organs: Secondary | ICD-10-CM | POA: Insufficient documentation

## 2023-04-08 NOTE — Progress Notes (Signed)
Patient presents today for therapeutic phlebotomy.  Patient is in satisfactory condition with no new complaints voiced.  Vital signs are stable.  We will proceed with phlebotomy per provider orders.  Therapeutic phlebotomy started at 1411 and ended at 1414. 500 mL of blood removed from right arm.  Vital signs remained stable.  Patient denies dizziness or lightheadedness.  Coban and gauze applied to site. Patient did not wait the recommended 30 minute post phlebotomy wait time.  Patient left ambulatory in stable condition.

## 2023-04-08 NOTE — Patient Instructions (Signed)
[facility] [place]  Discharge Instructions: Thank you for choosing [facility] to provide your oncology and hematology care.  If you have a lab appointment with the Cancer Center - please note that after April 8th, 2024, all labs will be drawn in the cancer center.  You do not have to check in or register with the main entrance as you have in the past but will complete your check-in in the cancer center.  Wear comfortable clothing and clothing appropriate for easy access to any Portacath or PICC line.   We strive to give you quality time with your provider. You may need to reschedule your appointment if you arrive late (15 or more minutes).  Arriving late affects you and other patients whose appointments are after yours.  Also, if you miss three or more appointments without notifying the office, you may be dismissed from the clinic at the provider's discretion.      For prescription refill requests, have your pharmacy contact our office and allow 72 hours for refills to be completed.    Today you received therapeutic phlebotomy      BELOW ARE SYMPTOMS THAT SHOULD BE REPORTED IMMEDIATELY: *FEVER GREATER THAN 100.4 F (38 C) OR HIGHER *CHILLS OR SWEATING *NAUSEA AND VOMITING THAT IS NOT CONTROLLED WITH YOUR NAUSEA MEDICATION *UNUSUAL SHORTNESS OF BREATH *UNUSUAL BRUISING OR BLEEDING *URINARY PROBLEMS (pain or burning when urinating, or frequent urination) *BOWEL PROBLEMS (unusual diarrhea, constipation, pain near the anus) TENDERNESS IN MOUTH AND THROAT WITH OR WITHOUT PRESENCE OF ULCERS (sore throat, sores in mouth, or a toothache) UNUSUAL RASH, SWELLING OR PAIN  UNUSUAL VAGINAL DISCHARGE OR ITCHING   Items with * indicate a potential emergency and should be followed up as soon as possible or go to the Emergency Department if any problems should occur.  Please show the CHEMOTHERAPY ALERT CARD or IMMUNOTHERAPY ALERT CARD at check-in to the Emergency Department  and triage nurse.  Should you have questions after your visit or need to cancel or reschedule your appointment, please contact [facility] [phone]  and follow the prompts.  Office hours are 8:00 a.m. to 4:30 p.m. Monday - Friday. Please note that voicemails left after 4:00 p.m. may not be returned until the following business day.  We are closed weekends and major holidays. You have access to a nurse at all times for urgent questions. Please call the main number to the clinic [phone] and follow the prompts.  For any non-urgent questions, you may also contact your provider using MyChart. We now offer e-Visits for anyone 18 and older to request care online for non-urgent symptoms. For details visit [email or web address].   Also download the MyChart app! Go to the app store, search "MyChart", open the app, select [facility], and log in with your MyChart username and password.   

## 2023-04-12 NOTE — Progress Notes (Signed)
Patient notified of LDCT Lung Cancer Screening Results via mail with the recommendation to follow-up in 12 months. Patient's referring provider has been sent a copy of results. Results are as follows:   IMPRESSION: 1. Lung-RADS 2, benign appearance or behavior. Continue annual screening with low-dose chest CT without contrast in 12 months. 2. Coronary artery calcifications, aortic Atherosclerosis (ICD10-I70.0) and Emphysema (ICD10-J43.9).  

## 2023-05-06 ENCOUNTER — Inpatient Hospital Stay: Payer: Managed Care, Other (non HMO) | Admitting: Oncology

## 2023-05-06 ENCOUNTER — Inpatient Hospital Stay: Payer: Managed Care, Other (non HMO)

## 2023-05-06 ENCOUNTER — Inpatient Hospital Stay: Payer: Managed Care, Other (non HMO) | Attending: Hematology

## 2023-05-06 LAB — COMPREHENSIVE METABOLIC PANEL
ALT: 24 U/L (ref 0–44)
AST: 22 U/L (ref 15–41)
Albumin: 4 g/dL (ref 3.5–5.0)
Alkaline Phosphatase: 84 U/L (ref 38–126)
Anion gap: 6 (ref 5–15)
BUN: 21 mg/dL — ABNORMAL HIGH (ref 6–20)
CO2: 27 mmol/L (ref 22–32)
Calcium: 8.9 mg/dL (ref 8.9–10.3)
Chloride: 104 mmol/L (ref 98–111)
Creatinine, Ser: 1.02 mg/dL (ref 0.61–1.24)
GFR, Estimated: >60 mL/min (ref >60)
Glucose, Bld: 104 mg/dL — ABNORMAL HIGH (ref 70–99)
Potassium: 4.2 mmol/L (ref 3.5–5.1)
Sodium: 137 mmol/L (ref 135–145)
Total Bilirubin: 0.9 mg/dL (ref 0.3–1.2)
Total Protein: 6.9 g/dL (ref 6.5–8.1)

## 2023-05-06 LAB — IRON AND TIBC
Iron: 197 ug/dL — ABNORMAL HIGH (ref 45–182)
Saturation Ratios: 83 % — ABNORMAL HIGH (ref 17.9–39.5)
TIBC: 239 ug/dL — ABNORMAL LOW (ref 250–450)
UIBC: 42 ug/dL

## 2023-05-06 LAB — CBC WITH DIFFERENTIAL/PLATELET
Abs Immature Granulocytes: 0.01 10*3/uL (ref 0.00–0.07)
Basophils Absolute: 0 10*3/uL (ref 0.0–0.1)
Basophils Relative: 1 %
Eosinophils Absolute: 0.2 10*3/uL (ref 0.0–0.5)
Eosinophils Relative: 2 %
HCT: 44 % (ref 39.0–52.0)
Hemoglobin: 15.5 g/dL (ref 13.0–17.0)
Immature Granulocytes: 0 %
Lymphocytes Relative: 28 %
Lymphs Abs: 2 10*3/uL (ref 0.7–4.0)
MCH: 32.4 pg (ref 26.0–34.0)
MCHC: 35.2 g/dL (ref 30.0–36.0)
MCV: 92.1 fL (ref 80.0–100.0)
Monocytes Absolute: 0.5 10*3/uL (ref 0.1–1.0)
Monocytes Relative: 6 %
Neutro Abs: 4.5 10*3/uL (ref 1.7–7.7)
Neutrophils Relative %: 63 %
Platelets: 256 10*3/uL (ref 150–400)
RBC: 4.78 MIL/uL (ref 4.22–5.81)
RDW: 11.7 % (ref 11.5–15.5)
WBC: 7.2 10*3/uL (ref 4.0–10.5)
nRBC: 0 % (ref 0.0–0.2)

## 2023-05-06 LAB — FERRITIN: Ferritin: 100 ng/mL (ref 24–336)

## 2023-05-06 NOTE — Progress Notes (Signed)
Garrett Lin presents today for theraputic phlebotomy per MD orders. Last hgb 15.5/hct 44 was on 05/06/23.VSS prior to procedure. Pt reports eating before arrival. Procedure started at 1113 using patients right AC. 250 grams of blood removed. Procedure ended at 1116. Gauze and coban applied to [name], site clean and dry. VSS upon completion of procedure. Pt denies dizziness, lightheadedness, or feeling faint. Patient did not wait the 30 minute recommended post procedure. Discharged in satisfactory condition with follow up instructions.

## 2023-05-06 NOTE — Patient Instructions (Signed)
[facility] [place]  Discharge Instructions: Thank you for choosing [facility] to provide your oncology and hematology care.  If you have a lab appointment with the Cancer Center - please note that after April 8th, 2024, all labs will be drawn in the cancer center.  You do not have to check in or register with the main entrance as you have in the past but will complete your check-in in the cancer center.  Wear comfortable clothing and clothing appropriate for easy access to any Portacath or PICC line.   We strive to give you quality time with your provider. You may need to reschedule your appointment if you arrive late (15 or more minutes).  Arriving late affects you and other patients whose appointments are after yours.  Also, if you miss three or more appointments without notifying the office, you may be dismissed from the clinic at the provider's discretion.      For prescription refill requests, have your pharmacy contact our office and allow 72 hours for refills to be completed.    Today you received therapeutic phlebotomy      BELOW ARE SYMPTOMS THAT SHOULD BE REPORTED IMMEDIATELY: *FEVER GREATER THAN 100.4 F (38 C) OR HIGHER *CHILLS OR SWEATING *NAUSEA AND VOMITING THAT IS NOT CONTROLLED WITH YOUR NAUSEA MEDICATION *UNUSUAL SHORTNESS OF BREATH *UNUSUAL BRUISING OR BLEEDING *URINARY PROBLEMS (pain or burning when urinating, or frequent urination) *BOWEL PROBLEMS (unusual diarrhea, constipation, pain near the anus) TENDERNESS IN MOUTH AND THROAT WITH OR WITHOUT PRESENCE OF ULCERS (sore throat, sores in mouth, or a toothache) UNUSUAL RASH, SWELLING OR PAIN  UNUSUAL VAGINAL DISCHARGE OR ITCHING   Items with * indicate a potential emergency and should be followed up as soon as possible or go to the Emergency Department if any problems should occur.  Please show the CHEMOTHERAPY ALERT CARD or IMMUNOTHERAPY ALERT CARD at check-in to the Emergency Department  and triage nurse.  Should you have questions after your visit or need to cancel or reschedule your appointment, please contact [facility] [phone]  and follow the prompts.  Office hours are 8:00 a.m. to 4:30 p.m. Monday - Friday. Please note that voicemails left after 4:00 p.m. may not be returned until the following business day.  We are closed weekends and major holidays. You have access to a nurse at all times for urgent questions. Please call the main number to the clinic [phone] and follow the prompts.  For any non-urgent questions, you may also contact your provider using MyChart. We now offer e-Visits for anyone 18 and older to request care online for non-urgent symptoms. For details visit [email or web address].   Also download the MyChart app! Go to the app store, search "MyChart", open the app, select [facility], and log in with your MyChart username and password.   

## 2023-05-20 ENCOUNTER — Inpatient Hospital Stay (HOSPITAL_BASED_OUTPATIENT_CLINIC_OR_DEPARTMENT_OTHER): Payer: Managed Care, Other (non HMO) | Admitting: Oncology

## 2023-05-20 NOTE — Progress Notes (Signed)
[facility] [address], [place] [address]   CLINIC:  Medical Oncology/Hematology  PCP:  Gabriel Earing, FNP [address] [place] [address] [phone]   REASON FOR VISIT:  Follow-up for hereditary hemochromatosis   PRIOR THERAPY: Weekly phlebotomy (500 mL each)   CURRENT THERAPY: Weekly phlebotomy (250 mL each)  INTERVAL HISTORY:   Garrett Lin 62 y.o. male returns for routine follow-up of hereditary hemochromatosis.  He was last seen by Dr. Buford Dresser on 01/07/23.  Patient has been receiving phlebotomies of 250 mL over the past few months for ferritin greater than 50.  Reports increased fatigue following phlebotomy if they take off more than 250 mL.  His last phlebotomy was approximately 2 weeks ago.  He had to cancel his last appointment due to conflict with another appointment.  Overall, he feels well.  Appetite and energy levels are 100%.  Denies any pain.  Continues to report early satiety.  ASSESSMENT & PLAN:  1.  Hereditary hemochromatosis (C282Y homozygous) - Severe hepatic iron deposition was incidental finding on recent MRI abdomen (06/11/2021), which was obtained by PCP for monitoring of previously noted hepatic cyst. - MRI abdomen (06/11/2021) showed severe diffuse hepatic iron deposition, as well as tiny < 5 mm cyst which correlates with lesion seen on prior CT; no evidence of hepatic neoplasm. - Hemochromatosis DNA testing shows homozygosity for C282Y HFE gene (07/23/2021).  No abnormalities on hepatic function panel.  Viral hepatitis panel was negative. - Cardiac MRI (09/16/2021) was negative for cardiac hemochromatosis. - At presentation, he had symptomatic iron overload with fatigue, joint pain, and abdominal pain after eating    - Discussed  course of untreated hereditary hemochromatosis with risk of cirrhosis, hepatocellular carcinoma, heart failure, and arrhythmia.  Iron overload also increases risk of certain bacterial infections. - We  have discussed lifestyle modifications such as avoiding alcohol and iron supplementation.  Recent guidelines do not find that moderate red meat intake adversely affects patients with hemochromatosis.  Patient should avoid raw fish or undercooked meat due to risk of certain bacterial infections to thrive and iron rich environments. - We have discussed phlebotomy protocol for treatment of hemochromatosis: Weekly phlebotomy as tolerated until goal ferritin is reached. If Hgb drops < 11.0, we will decrease frequency of phlebotomy. If patient experiences severe side effects or hemodynamic instability, we will decrease amount taken for phlebotomy. Goal is ferritin < 50 to reverse hepatic iron deposition. Once iron stores have normalized, we will decrease frequency of phlebotomy. - Initially started on weekly 500 mL phlebotomy, decreased to weekly 250 mL phlebotomy due to significant fatigue after full phlebotomy. - Most recent labs from 05/06/23 showed iron saturation 83% ferritin 100, hemoglobin 15.5 platelet count 256 normal differential.  CMP unremarkable. -Plan is to keep ferritin below 100 with monthly phlebotomies of 250 mL given worsening fatigue following phlebotomy. - PLAN:  -Return to clinic monthly for labs and phlebotomy and in 4 months for labs, phlebotomy and assessment.   2.  Tobacco abuse - Patient has been smoking 1.0-1.5 PPD since age 11. - This patient meets criteria for low-dose CT lung cancer screening (age 81-80 with a 20+ pack year history, current everyday smoker /OR/ quit < 15 years ago, no current signs or symptoms of lung cancer) - Shared decision making visit completed on 11/06/2021 - Low-dose CT chest (03/05/2022): Pulmonary nodules, lung RADS 2 (benign appearance or behavior); partially calcified bilateral pleural plaques indicative of asbestos-related pleural disease, aortic atherosclerosis,  emphysema, coronary artery disease -Most recent low-dose CT scan from 04/08/2023 was read  as lung RADS 2 and recommended follow-up in 1 year.  Will repeat low-dose CT scan in July 2025.  - PLAN:  - Once ferritin <50, will transition to outpatient blood donation once every 3 months with annual hematology follow-up   3.  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 - SUBSTANCE: He smokes 1.0-1.5 PPD cigarettes since age 30.  He drinks socially, usually 1 or 2 mixed drinks once or twice a year.  He has a previous history of heavy alcohol use, but quit 26 years ago (age 30).  He denies any illicit drug use. - FAMILY: No family history of liver disease, hemochromatosis, blood disorders, or iron disorders.  Patient's mother had breast cancer.   PLAN SUMMARY: >> Monthly phlebotomy (250 mL) >> Labs in 4 months = CBC/D, CMP, ferritin, iron/TIBC >> OFFICE visit in 4 months (after labs)     REVIEW OF SYSTEMS:   Review of Systems  Constitutional:  Positive for fatigue (mild).     PHYSICAL EXAM:  ECOG PERFORMANCE STATUS: 1 - Symptomatic but completely ambulatory  There were no vitals filed for this visit. There were no vitals filed for this visit. Physical Exam Constitutional:      Appearance: Normal appearance.  Cardiovascular:     Rate and Rhythm: Normal rate and regular rhythm.  Pulmonary:     Effort: Pulmonary effort is normal.     Breath sounds: Normal breath sounds.  Abdominal:     General: Bowel sounds are normal.     Palpations: Abdomen is soft.  Musculoskeletal:        General: No swelling. Normal range of motion.  Neurological:     Mental Status: He is alert and oriented to person, place, and time. Mental status is at baseline.     PAST MEDICAL/SURGICAL HISTORY:  Past Medical History:  Diagnosis Date   Chronic pain    right forearm.   Hereditary hemochromatosis (HCC)  08/07/2021   Irritable bowel syndrome (IBS) 2006, 2007   "nervous stomach" abdominal pain   Small bowel obstruction due to adhesions (HCC) 09/02/2013   Past Surgical History:  Procedure Laterality Date   COLONOSCOPY  2006   Carman Ching MD   COLONOSCOPY WITH PROPOFOL N/A 08/11/2021   Procedure: COLONOSCOPY WITH PROPOFOL;  Surgeon: Lanelle Bal, DO;  Location: [facility];  Service: Endoscopy;  Laterality: N/A;  9:00 / ASA II   ESOPHAGOGASTRODUODENOSCOPY  2006   Raymondo Band MD   LAPAROSCOPIC LYSIS OF ADHESIONS N/A 09/02/2013   Procedure: LAPAROSCOPIC LYSIS OF ADHESIONS ;  Surgeon: [name], MD;  Location: [facility];  Service: General;  Laterality: N/A;   SHOULDER ARTHROSCOPY Right 04/2014   Dr. Ranell Patrick   surgery for right forarm trauma     work related machine injury    SOCIAL HISTORY:  Social History   Socioeconomic History   Marital status: Married    Spouse name: Not on file   Number of children: Not on file   Years of education: Not on file   Highest education level: Not on file  Occupational History   Not on file  Tobacco Use   Smoking status: Every Day  Current packs/day: 1.00    Average packs/day: 1 pack/day for 38.0 years (38.0 ttl pk-yrs)    Types: Cigarettes   Smokeless tobacco: Never  Vaping Use   Vaping status: Never Used  Substance and Sexual Activity   Alcohol use: No   Drug use: No   Sexual activity: Not on file  Other Topics Concern   Not on file  Social History Narrative   Not on file   Social Determinants of Health   Financial Resource Strain: Not on file  Food Insecurity: Not on file  Transportation Needs: Not on file  Physical Activity: Not on file  Stress: Not on file  Social Connections: Not on file  Intimate Partner Violence: Not on file    FAMILY HISTORY:  Family History  Problem Relation Age of Onset   Ovarian cancer Sister     CURRENT MEDICATIONS:  Outpatient Encounter Medications as of 05/20/2023  Medication  Sig   meloxicam (MOBIC) 15 MG tablet Take 1 tablet (15 mg total) by mouth daily.   rosuvastatin (CRESTOR) 5 MG tablet Take 1 tablet (5 mg total) by mouth daily.   No facility-administered encounter medications on file as of 05/20/2023.    ALLERGIES:  No Known Allergies  LABORATORY DATA:  I have reviewed the labs as listed.  CBC    Component Value Date/Time   WBC 7.2 05/06/2023 1018   RBC 4.78 05/06/2023 1018   HGB 15.5 05/06/2023 1018   HGB 17.3 05/13/2021 0954   HCT 44.0 05/06/2023 1018   HCT 50.8 05/13/2021 0954   PLT 256 05/06/2023 1018   PLT 325 05/13/2021 0954   MCV 92.1 05/06/2023 1018   MCV 93 05/13/2021 0954   MCH 32.4 05/06/2023 1018   MCHC 35.2 05/06/2023 1018   RDW 11.7 05/06/2023 1018   RDW 12.1 05/13/2021 0954   LYMPHSABS 2.0 05/06/2023 1018   LYMPHSABS 2.0 05/13/2021 0954   MONOABS 0.5 05/06/2023 1018   EOSABS 0.2 05/06/2023 1018   EOSABS 0.2 05/13/2021 0954   BASOSABS 0.0 05/06/2023 1018   BASOSABS 0.1 05/13/2021 0954      Latest Ref Rng & Units 05/06/2023   10:18 AM 01/07/2023    8:38 AM 10/08/2022    8:12 AM  CMP  Glucose 70 - 99 mg/dL 409  811  914   BUN 6 - 20 mg/dL 21  17  15    Creatinine 0.61 - 1.24 mg/dL 7.82  9.56  2.13   Sodium 135 - 145 mmol/L 137  136  135   Potassium 3.5 - 5.1 mmol/L 4.2  5.0  3.9   Chloride 98 - 111 mmol/L 104  101  104   CO2 22 - 32 mmol/L 27  27  25    Calcium 8.9 - 10.3 mg/dL 8.9  9.4  9.2   Total Protein 6.5 - 8.1 g/dL 6.9  7.6  7.4   Total Bilirubin 0.3 - 1.2 mg/dL 0.9  0.7  0.7   Alkaline Phos 38 - 126 U/L 84  81  84   AST 15 - 41 U/L 22  16  17    ALT 0 - 44 U/L 24  15  13      DIAGNOSTIC IMAGING:  I have independently reviewed the relevant imaging and discussed with the patient.   WRAP UP:  All questions were answered. The patient knows to call the clinic with any problems, questions or concerns.  Medical decision making: Moderate  Time spent on visit: I spent 20 minutes dedicated  to the care of this patient  (face-to-face and non-face-to-face) on the date of the encounter to include what is described in the assessment and plan.   Garrett Kaufmann, NP  05/20/23 11:23 AM

## 2023-06-10 ENCOUNTER — Inpatient Hospital Stay: Payer: Managed Care, Other (non HMO)

## 2023-06-24 ENCOUNTER — Inpatient Hospital Stay: Payer: Managed Care, Other (non HMO) | Attending: Hematology

## 2023-06-24 NOTE — Progress Notes (Signed)
Garrett Lin presents for therapeutic phlebotomy per MD orders. Last HGB 15.5 / HCT 44.0 on 05/06/2023 . Vital signs stable prior to procedure. Procedure started at 08:39 am and ended at 08:42 am. 250 mls of blood removed. Patient denies any dizziness , lightheadedness, or feeling faint.  Gauze and coban applied to site. Vital signs stable at completion of procedure. Patient has no complaints at this time. Alert and oriented x 3. Discharged in stable condition.

## 2023-06-24 NOTE — Patient Instructions (Signed)
[facility]  Discharge Instructions: Thank you for choosing [facility] to provide your oncology and hematology care.  If you have a lab appointment with the Cancer Center - please note that after April 8th, 2024, all labs will be drawn in the cancer center.  You do not have to check in or register with the main entrance as you have in the past but will complete your check-in in the cancer center.  Wear comfortable clothing and clothing appropriate for easy access to any Portacath or PICC line.   We strive to give you quality time with your provider. You may need to reschedule your appointment if you arrive late (15 or more minutes).  Arriving late affects you and other patients whose appointments are after yours.  Also, if you miss three or more appointments without notifying the office, you may be dismissed from the clinic at the provider's discretion.      For prescription refill requests, have your pharmacy contact our office and allow 72 hours for refills to be completed.    Today you received the following chemotherapy and/or immunotherapy agents phlebotomy. Therapeutic Phlebotomy, Care After The following information offers guidance on how to care for yourself after your procedure. Your health care provider may also give you more specific instructions. If you have problems or questions, contact your health care provider. What can I expect after the procedure? After therapeutic phlebotomy, it is common to have: Light-headedness or dizziness. You may feel faint. Nausea. Tiredness (fatigue). Follow these instructions at home: Eating and drinking Be sure to eat well-balanced meals for the next 24 hours. Drink enough fluid to keep your urine pale yellow. Avoid drinking alcohol on the day that you had the procedure. Activity  Return to your normal activities as told by your health care provider. Most people can go back to their normal activities right  away. Avoid activities that take a lot of effort for about 5 hours after the procedure. Athletes should avoid strenuous exercise for at least 12 hours. Avoid heavy lifting or pulling for about 5 hours after the procedure. Do not lift anything that is heavier than 10 lb (4.5 kg). Change positions slowly for the remainder of the day, like from sitting to standing. This can help prevent light-headedness or fainting. If you feel light-headed, lie down until the feeling goes away. Needle insertion site care  Keep your bandage (dressing) dry. You can remove the bandage after about 5 hours or as told by your health care provider. If you have bleeding from the needle insertion site, raise (elevate) your arm and press firmly on the site until the bleeding stops. If you have bruising at the site, apply ice to the area. To do this: Put ice in a plastic bag. Place a towel between your skin and the bag. Leave the ice on for 20 minutes, 2-3 times a day for the first 24 hours. Remove the ice if your skin turns bright red so you do not damage the area. If the swelling does not go away after 24 hours, apply a warm, moist cloth (warm compress) to the area for 20 minutes, 2-3 times a day. General instructions Do not use any products that contain nicotine or tobacco, like cigarettes, chewing tobacco, and vaping devices, such as e-cigarettes, for at least 30 minutes after the procedure. If you need help quitting, ask your health care provider. Keep all follow-up visits. You may need to continue having regular blood tests  and therapeutic phlebotomy treatments as directed. Contact a health care provider if: You have redness, swelling, or pain at the needle insertion site. Fluid or blood is coming from the needle insertion site. Pus or a bad smell is coming from the needle insertion site. The needle insertion site feels warm to the touch. You feel light-headed, dizzy, or nauseous, and the feeling does not go  away. You have new bruising at the needle insertion site. You feel weaker than normal. You have a fever or chills. Get help right away if: You have chest pain. You have trouble breathing. You have severe nausea or vomiting. Summary After the procedure, it is common to have some light-headedness, dizziness, nausea, or tiredness (fatigue). Be sure to eat well-balanced meals for the next 24 hours. Drink enough fluid to keep your urine pale yellow. Return to your normal activities as told by your health care provider. Keep all follow-up visits. You may need to continue having regular blood tests and therapeutic phlebotomy treatments as directed. This information is not intended to replace advice given to you by your health care provider. Make sure you discuss any questions you have with your health care provider. Document Revised: 03/18/2021 Document Reviewed: 03/18/2021 Elsevier Patient Education  2024 [employer]       To help prevent nausea and vomiting after your treatment, we encourage you to take your nausea medication as directed.  BELOW ARE SYMPTOMS THAT SHOULD BE REPORTED IMMEDIATELY: *FEVER GREATER THAN 100.4 F (38 C) OR HIGHER *CHILLS OR SWEATING *NAUSEA AND VOMITING THAT IS NOT CONTROLLED WITH YOUR NAUSEA MEDICATION *UNUSUAL SHORTNESS OF BREATH *UNUSUAL BRUISING OR BLEEDING *URINARY PROBLEMS (pain or burning when urinating, or frequent urination) *BOWEL PROBLEMS (unusual diarrhea, constipation, pain near the anus) TENDERNESS IN MOUTH AND THROAT WITH OR WITHOUT PRESENCE OF ULCERS (sore throat, sores in mouth, or a toothache) UNUSUAL RASH, SWELLING OR PAIN  UNUSUAL VAGINAL DISCHARGE OR ITCHING   Items with * indicate a potential emergency and should be followed up as soon as possible or go to the Emergency Department if any problems should occur.  Please show the CHEMOTHERAPY ALERT CARD or IMMUNOTHERAPY ALERT CARD at check-in to the Emergency Department and triage  nurse.  Should you have questions after your visit or need to cancel or reschedule your appointment, please contact [facility] CENTER AT [facility] [phone]  and follow the prompts.  Office hours are 8:00 a.m. to 4:30 p.m. Monday - Friday. Please note that voicemails left after 4:00 p.m. may not be returned until the following business day.  We are closed weekends and major holidays. You have access to a nurse at all times for urgent questions. Please call the main number to the clinic [phone] and follow the prompts.  For any non-urgent questions, you may also contact your provider using MyChart. We now offer e-Visits for anyone 18 and older to request care online for non-urgent symptoms. For details visit [email or web address].   Also download the MyChart app! Go to the app store, search "MyChart", open the app, select [place], and log in with your MyChart username and password.

## 2023-07-14 ENCOUNTER — Other Ambulatory Visit: Payer: Self-pay

## 2023-07-15 ENCOUNTER — Inpatient Hospital Stay: Payer: Managed Care, Other (non HMO)

## 2023-07-22 ENCOUNTER — Inpatient Hospital Stay: Payer: Managed Care, Other (non HMO)

## 2023-07-22 ENCOUNTER — Inpatient Hospital Stay: Payer: Managed Care, Other (non HMO) | Attending: Hematology

## 2023-07-22 LAB — IRON AND TIBC
Iron: 220 ug/dL — ABNORMAL HIGH (ref 45–182)
Saturation Ratios: 84 % — ABNORMAL HIGH (ref 17.9–39.5)
TIBC: 261 ug/dL (ref 250–450)
UIBC: 41 ug/dL

## 2023-07-22 LAB — CBC WITH DIFFERENTIAL/PLATELET
Abs Immature Granulocytes: 0.03 10*3/uL (ref 0.00–0.07)
Basophils Absolute: 0 10*3/uL (ref 0.0–0.1)
Basophils Relative: 1 %
Eosinophils Absolute: 0.1 10*3/uL (ref 0.0–0.5)
Eosinophils Relative: 2 %
HCT: 49.7 % (ref 39.0–52.0)
Hemoglobin: 17.1 g/dL — ABNORMAL HIGH (ref 13.0–17.0)
Immature Granulocytes: 0 %
Lymphocytes Relative: 26 %
Lymphs Abs: 2 10*3/uL (ref 0.7–4.0)
MCH: 31.3 pg (ref 26.0–34.0)
MCHC: 34.4 g/dL (ref 30.0–36.0)
MCV: 90.9 fL (ref 80.0–100.0)
Monocytes Absolute: 0.6 10*3/uL (ref 0.1–1.0)
Monocytes Relative: 7 %
Neutro Abs: 4.9 10*3/uL (ref 1.7–7.7)
Neutrophils Relative %: 64 %
Platelets: 275 10*3/uL (ref 150–400)
RBC: 5.47 MIL/uL (ref 4.22–5.81)
RDW: 11.2 % — ABNORMAL LOW (ref 11.5–15.5)
WBC: 7.6 10*3/uL (ref 4.0–10.5)
nRBC: 0 % (ref 0.0–0.2)

## 2023-07-22 LAB — FERRITIN: Ferritin: 90 ng/mL (ref 24–336)

## 2023-07-22 NOTE — Progress Notes (Signed)
Patient presents today for therapeutic phlebotomy.  Patient is in satisfactory condition with no new complaints voiced.  Vital signs are stable.  We will proceed with phlebotomy per provider orders.  Therapeutic phlebotomy started at 0846 and ended at 0849.  250 mL of blood removed from R arm.  Vital signs remained stable.  Patient denies dizziness or lightheadedness.  Coban and gauze applied to site. Patient refused to wait the recommended 30 minute post phlebotomy wait time.  Patient left ambulatory in stable condition.

## 2023-07-22 NOTE — Patient Instructions (Signed)

## 2023-08-19 ENCOUNTER — Inpatient Hospital Stay: Payer: Managed Care, Other (non HMO)

## 2023-08-19 ENCOUNTER — Inpatient Hospital Stay: Payer: Managed Care, Other (non HMO) | Attending: Hematology

## 2023-08-19 DIAGNOSIS — F17210 Nicotine dependence, cigarettes, uncomplicated: Secondary | ICD-10-CM | POA: Diagnosis not present

## 2023-08-19 NOTE — Patient Instructions (Signed)
 [place] [facility] A DEPT OF [facility]  Discharge Instructions: Thank you for choosing [facility] to provide your oncology and hematology care.  If you have a lab appointment with the Cancer Center - please note that after April 8th, 2024, all labs will be drawn in the cancer center.  You do not have to check in or register with the main entrance as you have in the past but will complete your check-in in the cancer center.  Wear comfortable clothing and clothing appropriate for easy access to any Portacath or PICC line.   We strive to give you quality time with your provider. You may need to reschedule your appointment if you arrive late (15 or more minutes).  Arriving late affects you and other patients whose appointments are after yours.  Also, if you miss three or more appointments without notifying the office, you may be dismissed from the clinic at the provider's discretion.      For prescription refill requests, have your pharmacy contact our office and allow 72 hours for refills to be completed.    Today you received the following Phlebotomy, return as scheduled.   To help prevent nausea and vomiting after your treatment, we encourage you to take your nausea medication as directed.  BELOW ARE SYMPTOMS THAT SHOULD BE REPORTED IMMEDIATELY: *FEVER GREATER THAN 100.4 F (38 C) OR HIGHER *CHILLS OR SWEATING *NAUSEA AND VOMITING THAT IS NOT CONTROLLED WITH YOUR NAUSEA MEDICATION *UNUSUAL SHORTNESS OF BREATH *UNUSUAL BRUISING OR BLEEDING *URINARY PROBLEMS (pain or burning when urinating, or frequent urination) *BOWEL PROBLEMS (unusual diarrhea, constipation, pain near the anus) TENDERNESS IN MOUTH AND THROAT WITH OR WITHOUT PRESENCE OF ULCERS (sore throat, sores in mouth, or a toothache) UNUSUAL RASH, SWELLING OR PAIN  UNUSUAL VAGINAL DISCHARGE OR ITCHING   Items with * indicate a potential emergency and should be followed up as soon as possible  or go to the Emergency Department if any problems should occur.  Please show the CHEMOTHERAPY ALERT CARD or IMMUNOTHERAPY ALERT CARD at check-in to the Emergency Department and triage nurse.  Should you have questions after your visit or need to cancel or reschedule your appointment, please contact [place] [facility] - A DEPT OF Eligha Bridegroom [facility] [phone]  and follow the prompts.  Office hours are 8:00 a.m. to 4:30 p.m. Monday - Friday. Please note that voicemails left after 4:00 p.m. may not be returned until the following business day.  We are closed weekends and major holidays. You have access to a nurse at all times for urgent questions. Please call the main number to the clinic [phone] and follow the prompts.  For any non-urgent questions, you may also contact your provider using MyChart. We now offer e-Visits for anyone 26 and older to request care online for non-urgent symptoms. For details visit [email or web address].   Also download the MyChart app! Go to the app store, search "MyChart", open the app, select [place], and log in with your MyChart username and password.

## 2023-08-19 NOTE — Progress Notes (Signed)
Garrett Lin presents today for phlebotomy per MD orders. Phlebotomy procedure started at 1347 and ended at 1352. 250 cc removed. Patient tolerated procedure well. IV needle removed intact.

## 2023-08-20 IMAGING — MR MR ABDOMEN WO/W CM
20 series · 48 of 48 positions shown · IV contrast (10 ml Gadavist)
Comparison: CT on 01/24/2017

CLINICAL DATA: Indeterminate liver lesion on prior CT.

EXAM:
MRI ABDOMEN WITHOUT AND WITH CONTRAST
TECHNIQUE: Multiplanar multisequence MR imaging of the abdomen was performed
both before and after the administration of intravenous contrast.
CONTRAST:  10mL GADAVIST GADOBUTROL 1 MMOL/ML IV SOLN

[Series 3: cor haste · coronal · 6.0mm · 1.25mm/px · 1 of 30 slices shown]
[im 1/30]
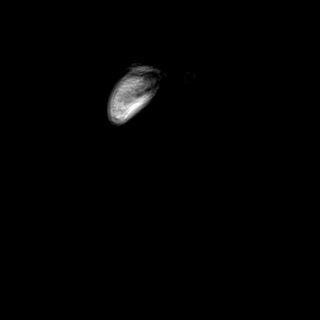

[Series 4: ax haste · axial · 6.0mm · 1.09mm/px · 1 of 42 slices shown]
[im 1/42]
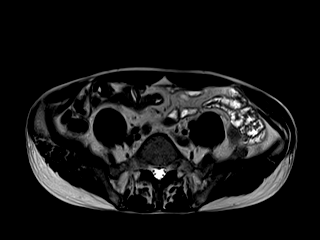

[Series 5: T2 fat-sat · axial · 6.0mm · 1.19mm/px · 1 of 36 slices shown]
[im 1/36]
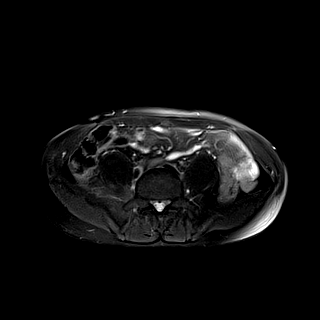

[Series 6: DWI · axial · 6.0mm · 1.31mm/px · z∈[-292,+75]mm · 2 of 52 slices shown (1 of 4)]
[im 1/52]
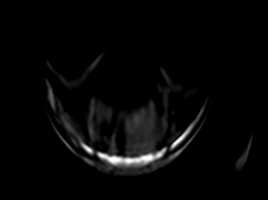
[im 52/52]
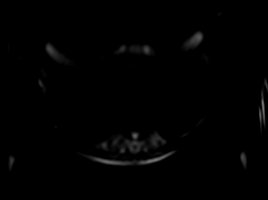

[Series 6: DWI · axial · 6.0mm · 1.31mm/px · z∈[-292,+75]mm · 2 of 52 slices shown (2 of 4)]
[im 1/52]
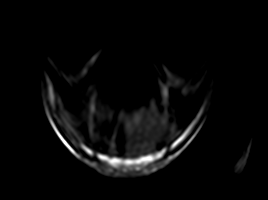
[im 52/52]
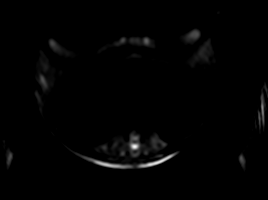

[Series 6: DWI · axial · 6.0mm · 1.31mm/px · z∈[-292,+75]mm · 2 of 52 slices shown (3 of 4)]
[im 1/52]
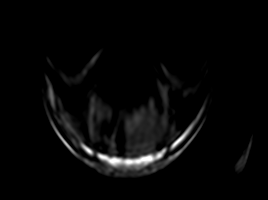
[im 52/52]
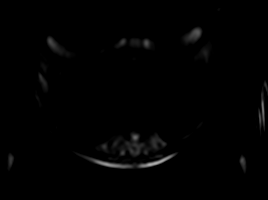

[Series 7: DWI · axial · 6.0mm · 1.31mm/px · z∈[-292,+75]mm · 2 of 52 slices shown (4 of 4)]
[im 1/52]
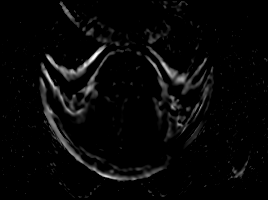
[im 52/52]
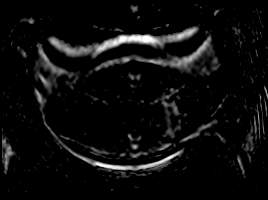

[Series 8: bSSFP · axial · 6.0mm · 0.68mm/px · z∈[-262,+44]mm · 2 of 52 slices shown]
[im 1/52]
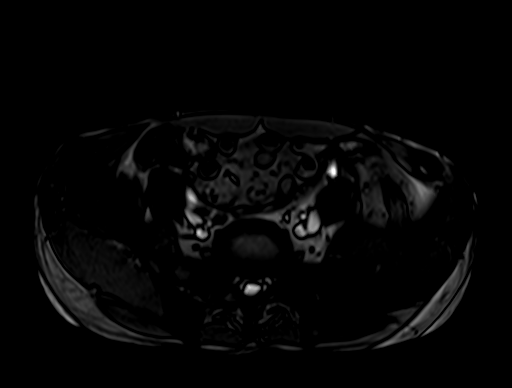
[im 52/52]
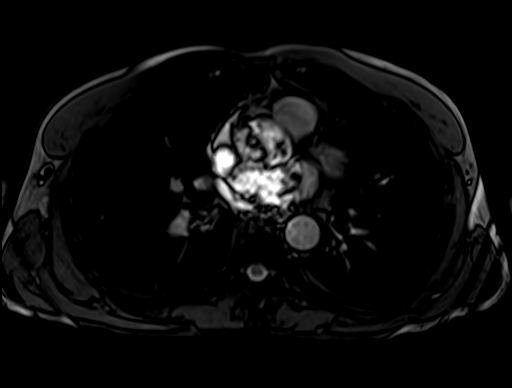

[Series 9: ax in and · axial · 3.0mm · 1.19mm/px · z∈[-215,-2]mm · 3 of 72 slices shown (1 of 2)]
[im 1/72]
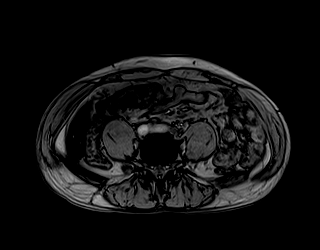
[im 36/72]
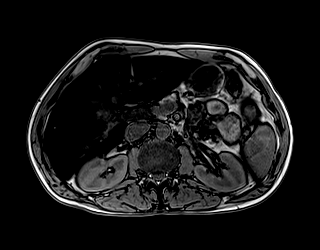
[im 72/72]
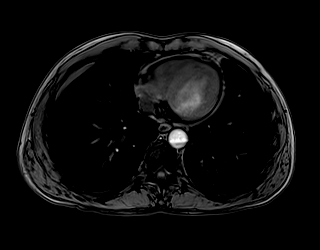

[Series 10: ax in and · axial · 3.0mm · 1.19mm/px · z∈[-215,-2]mm · 3 of 72 slices shown (2 of 2)]
[im 1/72]
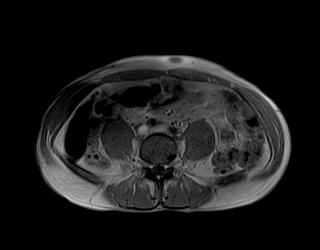
[im 36/72]
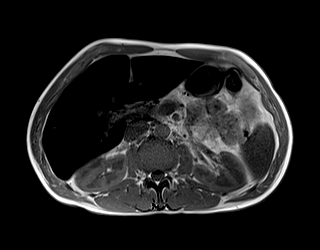
[im 72/72]
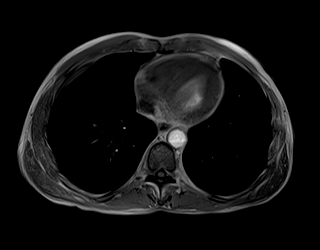

[Series 11: T1 dynamic · axial · non-contrast · 3.0mm · 1.19mm/px · z∈[-239,+22]mm · 3 of 88 slices shown (1 of 4)]
[im 1/88]
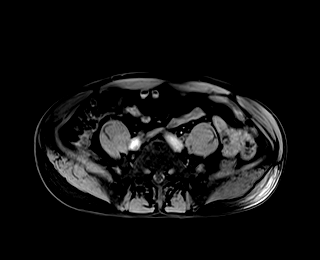
[im 44/88]
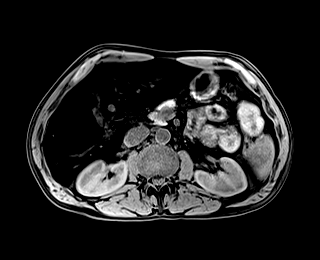
[im 88/88]
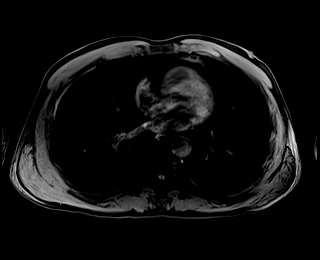

[Series 13: T1 dynamic post-contrast · axial · 3.0mm · 1.19mm/px · z∈[-239,+22]mm · 3 of 88 slices shown (1 of 6)]
[im 1/88]
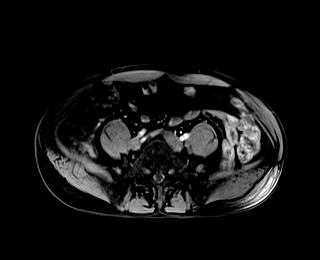
[im 44/88]
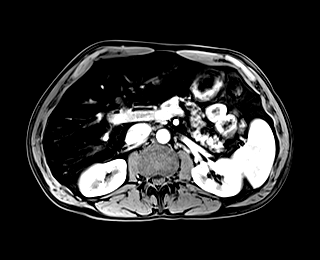
[im 88/88]
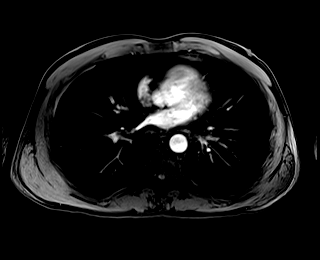

[Series 14: T1 dynamic · axial · 3.0mm · 1.19mm/px · z∈[-239,+22]mm · 3 of 88 slices shown (2 of 4)]
[im 1/88]
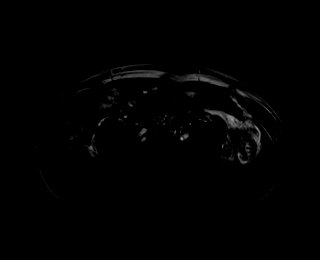
[im 44/88]
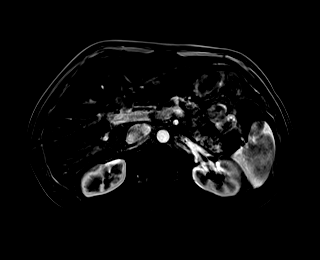
[im 88/88]
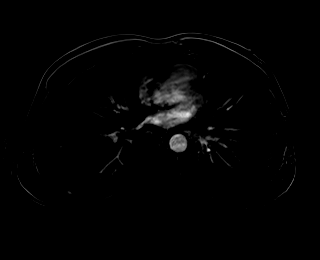

[Series 15: T1 dynamic post-contrast · axial · 3.0mm · 1.19mm/px · z∈[-239,+22]mm · 3 of 88 slices shown (2 of 6)]
[im 1/88]
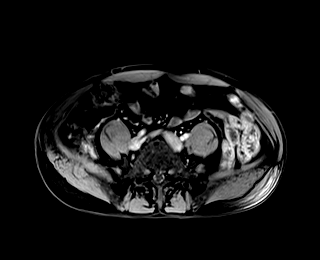
[im 44/88]
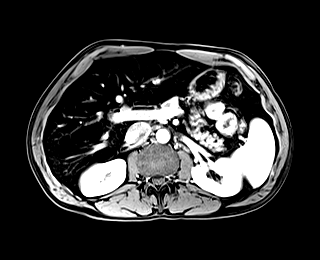
[im 88/88]
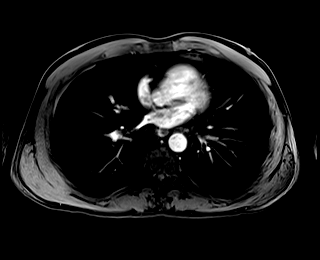

[Series 16: T1 dynamic · axial · 3.0mm · 1.19mm/px · z∈[-239,+22]mm · 3 of 88 slices shown (3 of 4)]
[im 1/88]
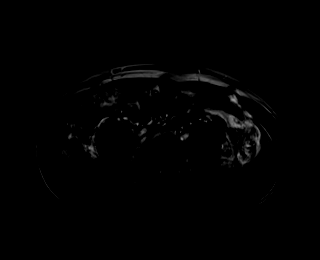
[im 44/88]
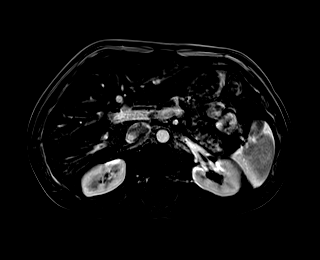
[im 88/88]
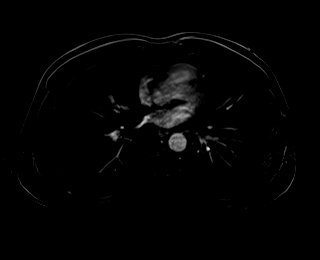

[Series 17: T1 dynamic post-contrast · axial · 3.0mm · 1.19mm/px · z∈[-239,+22]mm · 3 of 88 slices shown (3 of 6)]
[im 1/88]
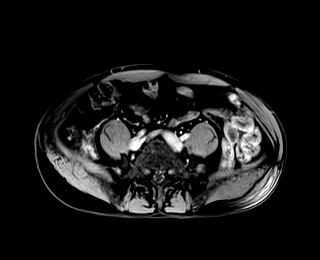
[im 44/88]
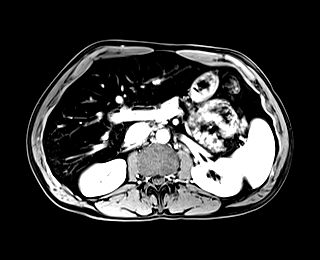
[im 88/88]
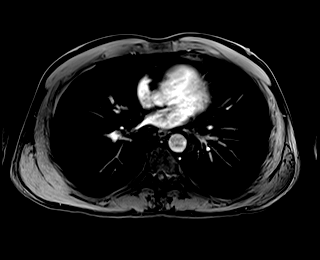

[Series 18: T1 dynamic · axial · 3.0mm · 1.19mm/px · z∈[-239,+22]mm · 3 of 88 slices shown (4 of 4)]
[im 1/88]
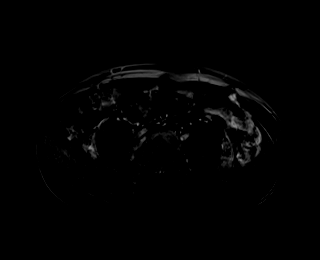
[im 44/88]
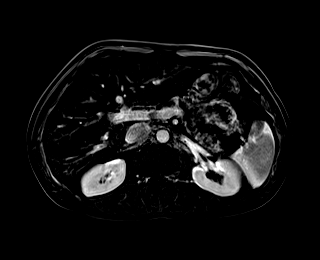
[im 88/88]
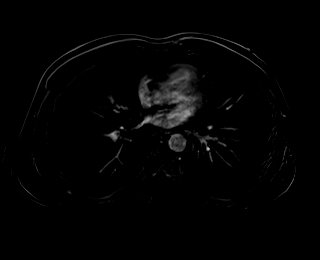

[Series 19: T1 dynamic post-contrast · coronal · 3.0mm · 1.19mm/px · 2 of 64 slices shown (4 of 6)]
[im 1/64]
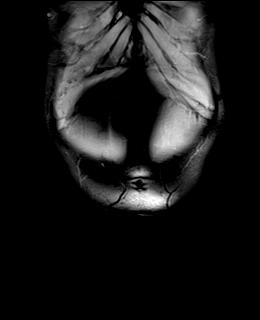
[im 64/64]
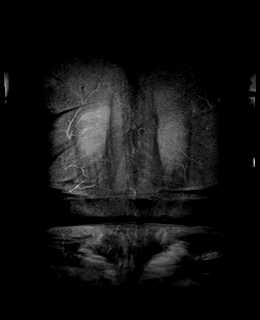

[Series 20: T1 dynamic post-contrast · axial · 3.0mm · 1.19mm/px · z∈[-239,+22]mm · 3 of 88 slices shown (5 of 6)]
[im 1/88]
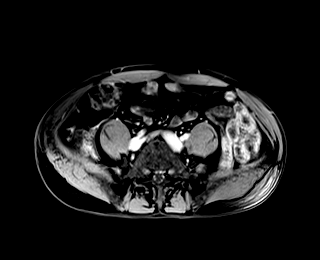
[im 44/88]
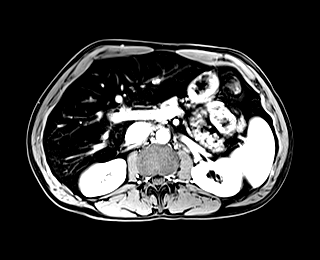
[im 88/88]
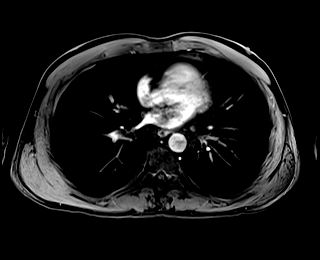

[Series 21: T1 dynamic post-contrast · axial · 3.0mm · 1.19mm/px · z∈[-239,+22]mm · 3 of 88 slices shown (6 of 6)]
[im 1/88]
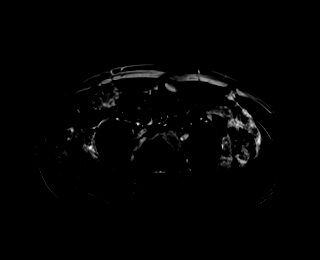
[im 44/88]
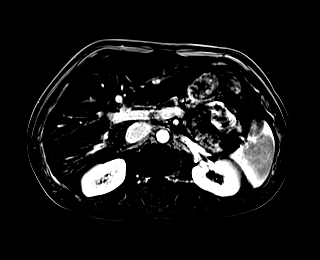
[im 88/88]
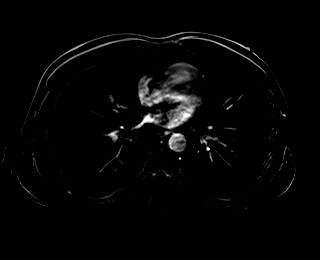

[48 of 48 positions shown; findings below may reference images not displayed]

FINDINGS: Lower chest: No acute findings.

Hepatobiliary: Severe diffuse T1 and T2 hypointensity is seen
throughout the hepatic parenchyma, with severe iron deposition. A
tiny less than 5 mm cyst is seen in the inferior right hepatic lobe
(image 19/series 3), which correlates with the tiny hepatic lesion
seen on prior CT. No hepatic masses are identified. Gallbladder is
unremarkable. No evidence of biliary ductal dilatation.

Pancreas: Normal signal intensity. No mass or inflammatory changes.

Spleen: Normal signal intensity. Within normal limits in size and
appearance.

Adrenals/Urinary Tract: Normal signal intensity. A few tiny sub-cm
right renal cysts are seen. No masses identified. No evidence of
hydronephrosis.

Stomach/Bowel: Visualized portion unremarkable.

Vascular/Lymphatic: No pathologically enlarged lymph nodes
identified. No acute vascular findings.

Other:  None.

Musculoskeletal:  No suspicious bone lesions identified.
IMPRESSION: Severe diffuse hepatic iron deposition.

Tiny sub-cm cyst in the right hepatic lobe correlates with the
lesion seen on previous CT. No evidence of hepatic neoplasm.

## 2023-09-08 ENCOUNTER — Other Ambulatory Visit: Payer: Self-pay

## 2023-09-09 ENCOUNTER — Inpatient Hospital Stay: Payer: Managed Care, Other (non HMO) | Attending: Hematology

## 2023-09-09 DIAGNOSIS — Z79899 Other long term (current) drug therapy: Secondary | ICD-10-CM | POA: Insufficient documentation

## 2023-09-09 LAB — CBC WITH DIFFERENTIAL/PLATELET
Abs Immature Granulocytes: 0.03 10*3/uL (ref 0.00–0.07)
Basophils Absolute: 0 10*3/uL (ref 0.0–0.1)
Basophils Relative: 0 %
Eosinophils Absolute: 0.1 10*3/uL (ref 0.0–0.5)
Eosinophils Relative: 1 %
HCT: 46.5 % (ref 39.0–52.0)
Hemoglobin: 16.1 g/dL (ref 13.0–17.0)
Immature Granulocytes: 0 %
Lymphocytes Relative: 21 %
Lymphs Abs: 1.9 10*3/uL (ref 0.7–4.0)
MCH: 31.9 pg (ref 26.0–34.0)
MCHC: 34.6 g/dL (ref 30.0–36.0)
MCV: 92.1 fL (ref 80.0–100.0)
Monocytes Absolute: 0.6 10*3/uL (ref 0.1–1.0)
Monocytes Relative: 6 %
Neutro Abs: 6.4 10*3/uL (ref 1.7–7.7)
Neutrophils Relative %: 72 %
Platelets: 283 10*3/uL (ref 150–400)
RBC: 5.05 MIL/uL (ref 4.22–5.81)
RDW: 11.6 % (ref 11.5–15.5)
WBC: 9 10*3/uL (ref 4.0–10.5)
nRBC: 0 % (ref 0.0–0.2)

## 2023-09-09 LAB — COMPREHENSIVE METABOLIC PANEL
ALT: 15 U/L (ref 0–44)
AST: 19 U/L (ref 15–41)
Albumin: 4 g/dL (ref 3.5–5.0)
Alkaline Phosphatase: 74 U/L (ref 38–126)
Anion gap: 10 (ref 5–15)
BUN: 22 mg/dL — ABNORMAL HIGH (ref 6–20)
CO2: 25 mmol/L (ref 22–32)
Calcium: 9 mg/dL (ref 8.9–10.3)
Chloride: 101 mmol/L (ref 98–111)
Creatinine, Ser: 1.17 mg/dL (ref 0.61–1.24)
GFR, Estimated: >60 mL/min (ref >60)
Glucose, Bld: 109 mg/dL — ABNORMAL HIGH (ref 70–99)
Potassium: 3.9 mmol/L (ref 3.5–5.1)
Sodium: 136 mmol/L (ref 135–145)
Total Bilirubin: 0.8 mg/dL (ref <1.2)
Total Protein: 7.1 g/dL (ref 6.5–8.1)

## 2023-09-09 LAB — IRON AND TIBC
Iron: 146 ug/dL (ref 45–182)
Saturation Ratios: 59 % — ABNORMAL HIGH (ref 17.9–39.5)
TIBC: 246 ug/dL — ABNORMAL LOW (ref 250–450)
UIBC: 100 ug/dL

## 2023-09-09 LAB — FERRITIN: Ferritin: 92 ng/mL (ref 24–336)

## 2023-09-16 ENCOUNTER — Inpatient Hospital Stay (HOSPITAL_BASED_OUTPATIENT_CLINIC_OR_DEPARTMENT_OTHER): Payer: Managed Care, Other (non HMO) | Admitting: Oncology

## 2023-09-16 ENCOUNTER — Encounter: Payer: Managed Care, Other (non HMO) | Admitting: Family Medicine

## 2023-09-16 ENCOUNTER — Inpatient Hospital Stay: Payer: Managed Care, Other (non HMO)

## 2023-09-16 DIAGNOSIS — Z87891 Personal history of nicotine dependence: Secondary | ICD-10-CM | POA: Diagnosis not present

## 2023-09-16 NOTE — Progress Notes (Signed)
Garrett Lin presents today for phlebotomy per MD orders. Phlebotomy procedure started at 1352 and ended at 1355 250 cc removed. Patient tolerated procedure well. IV needle removed intact. Pt refused to wait the recommened 30 minutes post phlebotomy wait time. Vitals stable at discharge and pt has no complaints.   [name] [employer]

## 2023-09-16 NOTE — Progress Notes (Unsigned)
[facility] [address], [place] [address]   CLINIC:  Medical Oncology/Hematology  PCP:  Gabriel Earing, FNP [address] [place] [address] [phone]   REASON FOR VISIT:  Follow-up for hereditary hemochromatosis   PRIOR THERAPY: Weekly phlebotomy (500 mL each)   CURRENT THERAPY: Weekly phlebotomy (250 mL each)  INTERVAL HISTORY:   Garrett Lin 62 y.o. male returns for routine follow-up of hereditary hemochromatosis.  Garrett Lin was last seen by me on 05/20/2023.  Patient has been receiving phlebotomies of 250 mL over the past few months for ferritin greater than 50.  Reports increased fatigue following phlebotomy if they take off more than 250 mL.  Garrett Lin last phlebotomy was on 08/19/2023.   Overall, Garrett Lin feels well.  Appetite and energy levels are 100%.  Denies any pain.  Continues to report early satiety.  ASSESSMENT & PLAN:  1.  Hereditary hemochromatosis (C282Y homozygous) - Severe hepatic iron deposition was incidental finding on recent MRI abdomen (06/11/2021), which was obtained by PCP for monitoring of previously noted hepatic cyst. - MRI abdomen (06/11/2021) showed severe diffuse hepatic iron deposition, as well as tiny < 5 mm cyst which correlates with lesion seen on prior CT; no evidence of hepatic neoplasm. - Hemochromatosis DNA testing shows homozygosity for C282Y HFE gene (07/23/2021).  No abnormalities on hepatic function panel.  Viral hepatitis panel was negative. - Cardiac MRI (09/16/2021) was negative for cardiac hemochromatosis. - At presentation, Garrett Lin had symptomatic iron overload with fatigue, joint pain, and abdominal pain after eating    - Discussed  course of untreated hereditary hemochromatosis with risk of cirrhosis, hepatocellular carcinoma, heart failure, and arrhythmia.  Iron overload also increases risk of certain bacterial infections. - We have discussed lifestyle modifications such as avoiding alcohol and iron supplementation.  Recent  guidelines do not find that moderate red meat intake adversely affects patients with hemochromatosis.  Patient should avoid raw fish or undercooked meat due to risk of certain bacterial infections to thrive and iron rich environments. - We have discussed phlebotomy protocol for treatment of hemochromatosis: Weekly phlebotomy as tolerated until goal ferritin is reached. If Hgb drops < 11.0, we will decrease frequency of phlebotomy. If patient experiences severe side effects or hemodynamic instability, we will decrease amount taken for phlebotomy. Goal is ferritin < 50 to reverse hepatic iron deposition. Once iron stores have normalized, we will decrease frequency of phlebotomy. - Initially started on weekly 500 mL phlebotomy, decreased to weekly 250 mL phlebotomy due to significant fatigue after full phlebotomy.   2.  Tobacco abuse - Patient has been smoking 1.0-1.5 PPD since age 71. - This patient meets criteria for low-dose CT lung cancer screening (age 85-80 with a 20+ pack year history, current everyday smoker /OR/ quit < 15 years ago, no current signs or symptoms of lung cancer) - Shared decision making visit completed on 11/06/2021 - Low-dose CT chest (03/05/2022): Pulmonary nodules, lung RADS 2 (benign appearance or behavior); partially calcified bilateral pleural plaques indicative of asbestos-related pleural disease, aortic atherosclerosis, emphysema, coronary artery disease    3.  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 - SUBSTANCE: Garrett Lin smokes 1.0-1.5 PPD cigarettes since age 89.  Garrett Lin drinks socially, usually 1 or 2 mixed drinks once  or twice a year.  Garrett Lin has a previous history of heavy alcohol use, but quit 26 years ago (age 38).  Garrett Lin denies any illicit drug use. - FAMILY: No family history of liver  disease, hemochromatosis, blood disorders, or iron disorders.  Patient's mother had breast cancer.  PLAN: 1. Hereditary hemochromatosis (HCC) (Primary) - Lab from 09/09/2023 show an iron saturation of 59% and ferritin of 92.  Hemoglobin is 16.1 and platelet count is normal at 283.  CMP is unremarkable. -Plan is to keep ferritin below 100 with monthly phlebotomies of 250 mL given worsening fatigue following phlebotomy. -Return to clinic monthly for labs and phlebotomy and in 4 months for labs, phlebotomy and assessment.   2. Personal history of tobacco use, presenting hazards to health -Most recent low-dose CT scan from 04/08/2023 was read as lung RADS 2 and recommended follow-up in 1 year.  Will repeat low-dose CT scan in July 2025. - Once ferritin <50, will transition to outpatient blood donation once every 3 months with annual hematology follow-up   PLAN SUMMARY: >> Monthly phlebotomy (250 mL) >> Labs in 4 months = CBC/D, CMP, ferritin, iron/TIBC >> OFFICE visit in 4 months (after labs).      REVIEW OF SYSTEMS:   Review of Systems  Constitutional:  Positive for fatigue (mild).    PHYSICAL EXAM:  ECOG PERFORMANCE STATUS: 1 - Symptomatic but completely ambulatory  There were no vitals filed for this visit. There were no vitals filed for this visit. Physical Exam Constitutional:      Appearance: Normal appearance.  Cardiovascular:     Rate and Rhythm: Normal rate and regular rhythm.  Pulmonary:     Effort: Pulmonary effort is normal.     Breath sounds: Normal breath sounds.  Abdominal:     General: Bowel sounds are normal.     Palpations: Abdomen is soft.  Musculoskeletal:        General: No swelling. Normal range of motion.  Neurological:     Mental Status: Garrett Lin is alert and oriented to person, place, and time. Mental status is at baseline.     PAST MEDICAL/SURGICAL HISTORY:  Past Medical History:  Diagnosis Date   Chronic pain    right forearm.   Hereditary  hemochromatosis (HCC) 08/07/2021   Irritable bowel syndrome (IBS) 2006, 2007   "nervous stomach" abdominal pain   Small bowel obstruction due to adhesions (HCC) 09/02/2013   Past Surgical History:  Procedure Laterality Date   COLONOSCOPY  2006   Carman Ching MD   COLONOSCOPY WITH PROPOFOL N/A 08/11/2021   Procedure: COLONOSCOPY WITH PROPOFOL;  Surgeon: Lanelle Bal, DO;  Location: [facility];  Service: Endoscopy;  Laterality: N/A;  9:00 / ASA II   ESOPHAGOGASTRODUODENOSCOPY  2006   Raymondo Band MD   LAPAROSCOPIC LYSIS OF ADHESIONS N/A 09/02/2013   Procedure: LAPAROSCOPIC LYSIS OF ADHESIONS ;  Surgeon: [name], MD;  Location: [facility] OR;  Service: General;  Laterality: N/A;   SHOULDER ARTHROSCOPY Right 04/2014   Dr. Ranell Patrick   surgery for right forarm trauma     work related machine injury    SOCIAL HISTORY:  Social History   Socioeconomic History   Marital status: Married    Spouse name: Not on file   Number of children: Not on file   Years of education: Not on file   Highest education level: Not on file  Occupational History   Not on file  Tobacco Use   Smoking status: Every  Day    Current packs/day: 1.00    Average packs/day: 1 pack/day for 38.0 years (38.0 ttl pk-yrs)    Types: Cigarettes   Smokeless tobacco: Never  Vaping Use   Vaping status: Never Used  Substance and Sexual Activity   Alcohol use: No   Drug use: No   Sexual activity: Not on file  Other Topics Concern   Not on file  Social History Narrative   Not on file   Social Drivers of Health   Financial Resource Strain: Not on file  Food Insecurity: Not on file  Transportation Needs: Not on file  Physical Activity: Not on file  Stress: Not on file  Social Connections: Not on file  Intimate Partner Violence: Not on file    FAMILY HISTORY:  Family History  Problem Relation Age of Onset   Ovarian cancer Sister     CURRENT MEDICATIONS:  Outpatient Encounter Medications as of  09/16/2023  Medication Sig   meloxicam (MOBIC) 15 MG tablet Take 1 tablet (15 mg total) by mouth daily.   rosuvastatin (CRESTOR) 5 MG tablet Take 1 tablet (5 mg total) by mouth daily.   No facility-administered encounter medications on file as of 09/16/2023.    ALLERGIES:  No Known Allergies  LABORATORY DATA:  I have reviewed the labs as listed.  CBC    Component Value Date/Time   WBC 9.0 09/09/2023 1023   RBC 5.05 09/09/2023 1023   HGB 16.1 09/09/2023 1023   HGB 17.3 05/13/2021 0954   HCT 46.5 09/09/2023 1023   HCT 50.8 05/13/2021 0954   PLT 283 09/09/2023 1023   PLT 325 05/13/2021 0954   MCV 92.1 09/09/2023 1023   MCV 93 05/13/2021 0954   MCH 31.9 09/09/2023 1023   MCHC 34.6 09/09/2023 1023   RDW 11.6 09/09/2023 1023   RDW 12.1 05/13/2021 0954   LYMPHSABS 1.9 09/09/2023 1023   LYMPHSABS 2.0 05/13/2021 0954   MONOABS 0.6 09/09/2023 1023   EOSABS 0.1 09/09/2023 1023   EOSABS 0.2 05/13/2021 0954   BASOSABS 0.0 09/09/2023 1023   BASOSABS 0.1 05/13/2021 0954      Latest Ref Rng & Units 09/09/2023   10:23 AM 05/06/2023   10:18 AM 01/07/2023    8:38 AM  CMP  Glucose 70 - 99 mg/dL 528  413  244   BUN 6 - 20 mg/dL 22  21  17    Creatinine 0.61 - 1.24 mg/dL 0.10  2.72  5.36   Sodium 135 - 145 mmol/L 136  137  136   Potassium 3.5 - 5.1 mmol/L 3.9  4.2  5.0   Chloride 98 - 111 mmol/L 101  104  101   CO2 22 - 32 mmol/L 25  27  27    Calcium 8.9 - 10.3 mg/dL 9.0  8.9  9.4   Total Protein 6.5 - 8.1 g/dL 7.1  6.9  7.6   Total Bilirubin <1.2 mg/dL 0.8  0.9  0.7   Alkaline Phos 38 - 126 U/L 74  84  81   AST 15 - 41 U/L 19  22  16    ALT 0 - 44 U/L 15  24  15      DIAGNOSTIC IMAGING:  I have independently reviewed the relevant imaging and discussed with the patient.   WRAP UP:  All questions were answered. The patient knows to call the clinic with any problems, questions or concerns.  Medical decision making: Moderate  Time spent on visit: I spent 20 minutes  dedicated to the  care of this patient (face-to-face and non-face-to-face) on the date of the encounter to include what is described in the assessment and plan.   Mauro Kaufmann, NP  09/16/23 12:51 PM

## 2023-09-20 ENCOUNTER — Encounter (HOSPITAL_COMMUNITY): Payer: Self-pay | Admitting: Hematology

## 2023-09-23 ENCOUNTER — Encounter: Payer: Managed Care, Other (non HMO) | Admitting: Family Medicine

## 2023-10-06 ENCOUNTER — Ambulatory Visit (INDEPENDENT_AMBULATORY_CARE_PROVIDER_SITE_OTHER): Payer: Managed Care, Other (non HMO) | Admitting: Family Medicine

## 2023-10-06 ENCOUNTER — Encounter: Payer: Self-pay | Admitting: Family Medicine

## 2023-10-06 VITALS — BP 115/73 | HR 72 | Temp 98.7°F | Ht 70.0 in | Wt 154.8 lb

## 2023-10-06 DIAGNOSIS — M25511 Pain in right shoulder: Secondary | ICD-10-CM

## 2023-10-06 DIAGNOSIS — G8929 Other chronic pain: Secondary | ICD-10-CM

## 2023-10-06 DIAGNOSIS — E782 Mixed hyperlipidemia: Secondary | ICD-10-CM

## 2023-10-06 DIAGNOSIS — I700 Atherosclerosis of aorta: Secondary | ICD-10-CM

## 2023-10-06 DIAGNOSIS — M25512 Pain in left shoulder: Secondary | ICD-10-CM

## 2023-10-06 DIAGNOSIS — Z720 Tobacco use: Secondary | ICD-10-CM

## 2023-10-06 DIAGNOSIS — Z716 Tobacco abuse counseling: Secondary | ICD-10-CM

## 2023-10-06 MED ORDER — MELOXICAM 15 MG PO TABS: 15.0000 mg | ORAL_TABLET | Freq: Every day | ORAL | 1 refills | Status: DC

## 2023-10-06 NOTE — Progress Notes (Signed)
 Established Patient Office Visit  Subjective   Patient ID: Garrett Lin, male    DOB: Dec 21, 1960  Age: 63 y.o. MRN: [id number]  Chief Complaint  Patient presents with   Medical Management of Chronic Issues    HPI  Shoulder pain Well controlled with mobic  as needed. Uses this mainly when he works. Works 4 days a week now.   2. Mixed hyperlipidemia On crestor . Regular diet.   3. Hereditary hemochromatosis Managed by hematology. Has monthly phlebotomy to keep ferritin <100.  4. Tobacco use 1-1.5 PPD since age 44. He has cut back now to <1 PPD. Trying to quit as he has a grand baby on the way. Had CT chest screening 04/08/23   Past Medical History:  Diagnosis Date   Chronic pain    right forearm.   Hereditary hemochromatosis (HCC) 08/07/2021   Irritable bowel syndrome (IBS) 2006, 2007   nervous stomach abdominal pain   Small bowel obstruction due to adhesions (HCC) 09/02/2013      ROS As per HPI.    Objective:     BP 115/73   Pulse 72   Temp 98.7 F (37.1 C) (Temporal)   Ht 5' 10 (1.778 m)   Wt 154 lb 12.8 oz (70.2 kg)   SpO2 96%   BMI 22.21 kg/m    Physical Exam Vitals and nursing note reviewed.  Constitutional:      General: He is not in acute distress.    Appearance: Normal appearance. He is not ill-appearing, toxic-appearing or diaphoretic.  Cardiovascular:     Rate and Rhythm: Normal rate and regular rhythm.     Heart sounds: Normal heart sounds. No murmur heard. Pulmonary:     Effort: Pulmonary effort is normal.     Breath sounds: Normal breath sounds.  Abdominal:     General: Bowel sounds are normal. There is no distension.     Palpations: Abdomen is soft.     Tenderness: There is no abdominal tenderness. There is no guarding or rebound.  Musculoskeletal:     Right lower leg: No edema.     Left lower leg: No edema.  Skin:    General: Skin is warm and dry.  Neurological:     General: No focal deficit present.     Mental Status: He is  alert and oriented to person, place, and time.  Psychiatric:        Mood and Affect: Mood normal.      No results found for any visits on 10/06/23.    The 10-year ASCVD risk score (Arnett DK, et al., 2019) is: 11.7%    Assessment & Plan:   Mixed hyperlipidemia Assessment & Plan: On statin. Diet, exercise.   Orders: -     Lipid panel  Aortic atherosclerosis (HCC) Assessment & Plan: On statin and aspirin.    Chronic pain of both shoulders Assessment & Plan: Well controlled with mobic  prn. Recent GFR is normal.   Orders: -     Meloxicam ; Take 1 tablet (15 mg total) by mouth daily.  Dispense: 90 tablet; Refill: 1  Hereditary hemochromatosis (HCC) Assessment & Plan: Managed by hematology. Monthly phlebotomy.    Tobacco abuse Tobacco abuse counseling 3 mins of smoking cessation instruction/counseling given:  counseled patient on the dangers of tobacco use, advised patient to stop smoking, and reviewed strategies to maximize success. Declined medications today.      Return in about 6 months (around 04/04/2024) for CPE.   The patient indicates understanding  of these issues and agrees with the plan.  [name], FNP

## 2023-10-06 NOTE — Assessment & Plan Note (Signed)
 Managed by hematology. Monthly phlebotomy.

## 2023-10-06 NOTE — Assessment & Plan Note (Signed)
On statin and aspirin 

## 2023-10-06 NOTE — Assessment & Plan Note (Signed)
 Well controlled with mobic prn. Recent GFR is normal.

## 2023-10-06 NOTE — Assessment & Plan Note (Signed)
 On statin. Diet, exercise.

## 2023-10-07 LAB — LIPID PANEL
Chol/HDL Ratio: 2.6 {ratio} (ref 0.0–5.0)
Cholesterol, Total: 140 mg/dL (ref 100–199)
HDL: 53 mg/dL (ref >39)
LDL Chol Calc (NIH): 72 mg/dL (ref 0–99)
Triglycerides: 78 mg/dL (ref 0–149)
VLDL Cholesterol Cal: 15 mg/dL (ref 5–40)

## 2023-10-13 ENCOUNTER — Inpatient Hospital Stay: Payer: Managed Care, Other (non HMO) | Admitting: Genetic Counselor

## 2023-10-20 ENCOUNTER — Other Ambulatory Visit: Payer: Self-pay

## 2023-10-21 ENCOUNTER — Inpatient Hospital Stay: Payer: Managed Care, Other (non HMO) | Attending: Hematology

## 2023-10-21 ENCOUNTER — Inpatient Hospital Stay: Payer: Managed Care, Other (non HMO)

## 2023-10-21 LAB — CBC WITH DIFFERENTIAL/PLATELET
Abs Immature Granulocytes: 0.02 10*3/uL (ref 0.00–0.07)
Basophils Absolute: 0 10*3/uL (ref 0.0–0.1)
Basophils Relative: 1 %
Eosinophils Absolute: 0.1 10*3/uL (ref 0.0–0.5)
Eosinophils Relative: 2 %
HCT: 49.2 % (ref 39.0–52.0)
Hemoglobin: 17.4 g/dL — ABNORMAL HIGH (ref 13.0–17.0)
Immature Granulocytes: 0 %
Lymphocytes Relative: 28 %
Lymphs Abs: 2.1 10*3/uL (ref 0.7–4.0)
MCH: 32.5 pg (ref 26.0–34.0)
MCHC: 35.4 g/dL (ref 30.0–36.0)
MCV: 91.8 fL (ref 80.0–100.0)
Monocytes Absolute: 0.6 10*3/uL (ref 0.1–1.0)
Monocytes Relative: 8 %
Neutro Abs: 4.7 10*3/uL (ref 1.7–7.7)
Neutrophils Relative %: 61 %
Platelets: 299 10*3/uL (ref 150–400)
RBC: 5.36 MIL/uL (ref 4.22–5.81)
RDW: 11.5 % (ref 11.5–15.5)
WBC: 7.5 10*3/uL (ref 4.0–10.5)
nRBC: 0 % (ref 0.0–0.2)

## 2023-10-21 LAB — IRON AND TIBC
Iron: 132 ug/dL (ref 45–182)
Saturation Ratios: 47 % — ABNORMAL HIGH (ref 17.9–39.5)
TIBC: 280 ug/dL (ref 250–450)
UIBC: 148 ug/dL

## 2023-10-21 LAB — FERRITIN: Ferritin: 98 ng/mL (ref 24–336)

## 2023-10-21 LAB — COMPREHENSIVE METABOLIC PANEL
ALT: 16 U/L (ref 0–44)
AST: 19 U/L (ref 15–41)
Albumin: 4.2 g/dL (ref 3.5–5.0)
Alkaline Phosphatase: 78 U/L (ref 38–126)
Anion gap: 3 — ABNORMAL LOW (ref 5–15)
BUN: 18 mg/dL (ref 6–20)
CO2: 27 mmol/L (ref 22–32)
Calcium: 9.3 mg/dL (ref 8.9–10.3)
Chloride: 105 mmol/L (ref 98–111)
Creatinine, Ser: 1.08 mg/dL (ref 0.61–1.24)
GFR, Estimated: >60 mL/min (ref >60)
Glucose, Bld: 120 mg/dL — ABNORMAL HIGH (ref 70–99)
Potassium: 5 mmol/L (ref 3.5–5.1)
Sodium: 135 mmol/L (ref 135–145)
Total Bilirubin: 0.6 mg/dL (ref 0.0–1.2)
Total Protein: 7.6 g/dL (ref 6.5–8.1)

## 2023-10-21 NOTE — Progress Notes (Signed)
Patient presents today for therapeutic phlebotomy.  Patient is in satisfactory condition with no new complaints voiced.  Vital signs are stable.  We will proceed with phlebotomy per provider orders.  Therapeutic phlebotomy started at 0927 and ended at 0929.  250 mL of blood removed from R arm.  Vital signs remained stable.  Patient denies dizziness or lightheadedness.  Coban and gauze applied to site. Patient refused to wait the recommended 30 minute post phlebotomy wait time.  Patient left ambulatory in stable condition.

## 2023-10-21 NOTE — Patient Instructions (Signed)

## 2023-11-25 ENCOUNTER — Other Ambulatory Visit: Payer: Managed Care, Other (non HMO)

## 2023-11-25 ENCOUNTER — Inpatient Hospital Stay: Payer: Managed Care, Other (non HMO) | Attending: Hematology

## 2023-11-25 NOTE — Patient Instructions (Signed)

## 2023-11-25 NOTE — Progress Notes (Signed)
Garrett Lin presents today for phlebotomy per MD orders. Phlebotomy procedure started at 1139 and ended at 1143 250 cc removed. Patient tolerated procedure well. IV needle removed intact. Pt refused to wait the recommended 30 minutes post phlebotomy wait time. Vitals stable at discharge and pt has no complaints.  Jettie Lazare [employer]

## 2023-12-23 ENCOUNTER — Inpatient Hospital Stay: Payer: Managed Care, Other (non HMO) | Attending: Hematology

## 2023-12-23 ENCOUNTER — Other Ambulatory Visit: Payer: Managed Care, Other (non HMO)

## 2023-12-23 NOTE — Progress Notes (Signed)
 Garrett Lin presents today for phlebotomy per MD orders. Phlebotomy procedure started at 0811 and ended at 0813. 250 cc removed. Patient tolerated procedure well. IV needle removed intact.

## 2023-12-23 NOTE — Patient Instructions (Signed)
 [facility] - A DEPT OF [facility]  Discharge Instructions: Thank you for choosing [facility] to provide your oncology and hematology care.  If you have a lab appointment with the Cancer Center - please note that after April 8th, 2024, all labs will be drawn in the cancer center.  You do not have to check in or register with the main entrance as you have in the past but will complete your check-in in the cancer center.  Wear comfortable clothing and clothing appropriate for easy access to any Portacath or PICC line.   We strive to give you quality time with your provider. You may need to reschedule your appointment if you arrive late (15 or more minutes).  Arriving late affects you and other patients whose appointments are after yours.  Also, if you miss three or more appointments without notifying the office, you may be dismissed from the clinic at the provider's discretion.      For prescription refill requests, have your pharmacy contact our office and allow 72 hours for refills to be completed.    Today you received the following therapeutic phlebotomy, return as scheduled.   To help prevent nausea and vomiting after your treatment, we encourage you to take your nausea medication as directed.  BELOW ARE SYMPTOMS THAT SHOULD BE REPORTED IMMEDIATELY: *FEVER GREATER THAN 100.4 F (38 C) OR HIGHER *CHILLS OR SWEATING *NAUSEA AND VOMITING THAT IS NOT CONTROLLED WITH YOUR NAUSEA MEDICATION *UNUSUAL SHORTNESS OF BREATH *UNUSUAL BRUISING OR BLEEDING *URINARY PROBLEMS (pain or burning when urinating, or frequent urination) *BOWEL PROBLEMS (unusual diarrhea, constipation, pain near the anus) TENDERNESS IN MOUTH AND THROAT WITH OR WITHOUT PRESENCE OF ULCERS (sore throat, sores in mouth, or a toothache) UNUSUAL RASH, SWELLING OR PAIN  UNUSUAL VAGINAL DISCHARGE OR ITCHING   Items with * indicate a potential emergency and should be followed up as soon  as possible or go to the Emergency Department if any problems should occur.  Please show the CHEMOTHERAPY ALERT CARD or IMMUNOTHERAPY ALERT CARD at check-in to the Emergency Department and triage nurse.  Should you have questions after your visit or need to cancel or reschedule your appointment, please contact [facility] [place] - A DEPT OF [facility] [phone]  and follow the prompts.  Office hours are 8:00 a.m. to 4:30 p.m. Monday - Friday. Please note that voicemails left after 4:00 p.m. may not be returned until the following business day.  We are closed weekends and major holidays. You have access to a nurse at all times for urgent questions. Please call the main number to the clinic [phone] and follow the prompts.  For any non-urgent questions, you may also contact your provider using MyChart. We now offer e-Visits for anyone 74 and older to request care online for non-urgent symptoms. For details visit [email or web address].   Also download the MyChart app! Go to the app store, search "MyChart", open the app, select [facility], and log in with your MyChart username and password.

## 2024-01-10 ENCOUNTER — Other Ambulatory Visit: Payer: Self-pay

## 2024-01-10 DIAGNOSIS — Z87891 Personal history of nicotine dependence: Secondary | ICD-10-CM

## 2024-01-10 DIAGNOSIS — Z122 Encounter for screening for malignant neoplasm of respiratory organs: Secondary | ICD-10-CM

## 2024-01-13 ENCOUNTER — Emergency Department (HOSPITAL_BASED_OUTPATIENT_CLINIC_OR_DEPARTMENT_OTHER)

## 2024-01-13 ENCOUNTER — Emergency Department (HOSPITAL_BASED_OUTPATIENT_CLINIC_OR_DEPARTMENT_OTHER)
Admission: EM | Admit: 2024-01-13 | Discharge: 2024-01-13 | Disposition: A | Discharge status: Home / Self Care | Attending: Emergency Medicine | Admitting: Emergency Medicine

## 2024-01-13 ENCOUNTER — Ambulatory Visit: Payer: Self-pay

## 2024-01-13 ENCOUNTER — Other Ambulatory Visit: Payer: Self-pay

## 2024-01-13 ENCOUNTER — Encounter (HOSPITAL_BASED_OUTPATIENT_CLINIC_OR_DEPARTMENT_OTHER): Payer: Self-pay

## 2024-01-13 DIAGNOSIS — K529 Noninfective gastroenteritis and colitis, unspecified: Secondary | ICD-10-CM | POA: Insufficient documentation

## 2024-01-13 DIAGNOSIS — R109 Unspecified abdominal pain: Secondary | ICD-10-CM | POA: Diagnosis present

## 2024-01-13 LAB — COMPREHENSIVE METABOLIC PANEL WITH GFR
ALT: 15 U/L (ref 0–44)
AST: 17 U/L (ref 15–41)
Albumin: 4.6 g/dL (ref 3.5–5.0)
Alkaline Phosphatase: 74 U/L (ref 38–126)
Anion gap: 11 (ref 5–15)
BUN: 19 mg/dL (ref 6–20)
CO2: 26 mmol/L (ref 22–32)
Calcium: 9.2 mg/dL (ref 8.9–10.3)
Chloride: 98 mmol/L (ref 98–111)
Creatinine, Ser: 1.11 mg/dL (ref 0.61–1.24)
GFR, Estimated: >60 mL/min (ref >60)
Glucose, Bld: 106 mg/dL — ABNORMAL HIGH (ref 70–99)
Potassium: 3.6 mmol/L (ref 3.5–5.1)
Sodium: 135 mmol/L (ref 135–145)
Total Bilirubin: 0.9 mg/dL (ref 0.0–1.2)
Total Protein: 7.7 g/dL (ref 6.5–8.1)

## 2024-01-13 LAB — CBC
HCT: 50.7 % (ref 39.0–52.0)
Hemoglobin: 18 g/dL — ABNORMAL HIGH (ref 13.0–17.0)
MCH: 31.9 pg (ref 26.0–34.0)
MCHC: 35.5 g/dL (ref 30.0–36.0)
MCV: 89.9 fL (ref 80.0–100.0)
Platelets: 259 10*3/uL (ref 150–400)
RBC: 5.64 MIL/uL (ref 4.22–5.81)
RDW: 11.6 % (ref 11.5–15.5)
WBC: 11.5 10*3/uL — ABNORMAL HIGH (ref 4.0–10.5)
nRBC: 0 % (ref 0.0–0.2)

## 2024-01-13 LAB — URINALYSIS, ROUTINE W REFLEX MICROSCOPIC
Bacteria, UA: NONE SEEN
Bilirubin Urine: NEGATIVE
Glucose, UA: NEGATIVE mg/dL
Ketones, ur: 40 mg/dL — AB
Leukocytes,Ua: NEGATIVE
Nitrite: NEGATIVE
Protein, ur: 30 mg/dL — AB
Specific Gravity, Urine: >1.046 — ABNORMAL HIGH (ref 1.005–1.030)
pH: 6 (ref 5.0–8.0)

## 2024-01-13 LAB — LIPASE, BLOOD: Lipase: 20 U/L (ref 11–51)

## 2024-01-13 MED ORDER — ONDANSETRON HCL 4 MG/2ML IJ SOLN
4.0000 mg | Freq: Once | INTRAMUSCULAR | Status: AC
Start: 2024-01-13 — End: 2024-01-13
  Administered 2024-01-13: 4 mg via INTRAVENOUS
  Filled 2024-01-13: qty 2

## 2024-01-13 MED ORDER — LACTATED RINGERS IV BOLUS
1000.0000 mL | Freq: Once | INTRAVENOUS | Status: AC
  Administered 2024-01-13: 1000 mL via INTRAVENOUS

## 2024-01-13 MED ORDER — IOHEXOL 300 MG/ML  SOLN
100.0000 mL | Freq: Once | INTRAMUSCULAR | Status: AC | PRN
  Administered 2024-01-13: 100 mL via INTRAVENOUS

## 2024-01-13 MED ORDER — ONDANSETRON 4 MG PO TBDP: 4.0000 mg | ORAL_TABLET | Freq: Three times a day (TID) | ORAL | 0 refills | Status: DC | PRN

## 2024-01-13 MED ORDER — AZITHROMYCIN 250 MG PO TABS: 1000.0000 mg | ORAL_TABLET | Freq: Once | ORAL | 0 refills | Status: AC

## 2024-01-13 MED ORDER — ONDANSETRON 4 MG PO TBDP
4.0000 mg | ORAL_TABLET | Freq: Once | ORAL | Status: AC | PRN
  Administered 2024-01-13: 4 mg via ORAL
  Filled 2024-01-13: qty 1

## 2024-01-13 NOTE — Telephone Encounter (Signed)
 I spoke to pt and he states he is having nausea, vomiting, diarrhea and abdominal pain. He has not been able to keep anything down in greater than 24 hours and now his urine is dark and he is worried about dehydration. Advised pt he ntbs for evaluation and we don't have any availability so he is going to go to ED for evaluation.

## 2024-01-13 NOTE — ED Triage Notes (Signed)
 Pt c/o NVD, "orange kool aid pee" onset Wednesday. "I thought it was getting better today, drank some liquid IV & I didn't like it."  Hx diverticulitis, advises "rolling" abd pain "like a volcano."

## 2024-01-13 NOTE — ED Provider Notes (Signed)
 [place] EMERGENCY DEPARTMENT AT [facility] Provider Note   CSN: [id number] Arrival date & time: 01/13/24  1701     History  Chief Complaint  Patient presents with   Abdominal Pain   Emesis   Nausea   Diarrhea    Garrett Lin is a 63 y.o. male with hereditary hemochromatosis, small bowel obstruction, presents with concern for nonbloody diarrhea ongoing for past 3 days.  Reports this started after eating a salad at work.  He also reports some nausea with 1 episode of vomiting after drinking oral IV.  He also reports his urine looks "orange" which happens when he is dehydrated.  He denies any recent travel, recent antibiotic use.  Denies any fevers.   Abdominal Pain Associated symptoms: diarrhea and vomiting   Emesis Associated symptoms: abdominal pain and diarrhea   Diarrhea Associated symptoms: abdominal pain and vomiting        Home Medications Prior to Admission medications   Medication Sig Start Date End Date Taking? Authorizing Provider  azithromycin (ZITHROMAX) 250 MG tablet Take 4 tablets (1,000 mg total) by mouth once for 1 dose. 01/13/24 01/13/24 Yes Rexie Catena, PA-C  ondansetron (ZOFRAN-ODT) 4 MG disintegrating tablet Take 1 tablet (4 mg total) by mouth every 8 (eight) hours as needed for nausea or vomiting. 01/13/24  Yes Rexie Catena, PA-C  meloxicam (MOBIC) 15 MG tablet Take 1 tablet (15 mg total) by mouth daily. 10/06/23   Albertha Huger, FNP  rosuvastatin (CRESTOR) 5 MG tablet Take 1 tablet (5 mg total) by mouth daily. 03/18/23   Albertha Huger, FNP      Allergies    Patient has no known allergies.    Review of Systems   Review of Systems  Gastrointestinal:  Positive for abdominal pain, diarrhea and vomiting.    Physical Exam Updated Vital Signs BP (!) 145/88   Pulse 87   Temp 98.1 F (36.7 C)   Resp 18   SpO2 99%  Physical Exam Vitals and nursing note reviewed.  Constitutional:      General: He is not in acute  distress.    Appearance: He is well-developed.  HENT:     Head: Normocephalic and atraumatic.  Eyes:     Conjunctiva/sclera: Conjunctivae normal.  Cardiovascular:     Rate and Rhythm: Normal rate and regular rhythm.     Heart sounds: No murmur heard. Pulmonary:     Effort: Pulmonary effort is normal. No respiratory distress.     Breath sounds: Normal breath sounds.  Abdominal:     Palpations: Abdomen is soft.     Tenderness: There is no abdominal tenderness.     Comments: Abdomen soft nontender, no rebound or guarding  Musculoskeletal:        General: No swelling.     Cervical back: Neck supple.  Skin:    General: Skin is warm and dry.     Capillary Refill: Capillary refill takes less than 2 seconds.  Neurological:     Mental Status: He is alert.  Psychiatric:        Mood and Affect: Mood normal.     ED Results / Procedures / Treatments   Labs (all labs ordered are listed, but only abnormal results are displayed) Labs Reviewed  COMPREHENSIVE METABOLIC PANEL WITH GFR - Abnormal; Notable for the following components:      Result Value   Glucose, Bld 106 (*)    All other components within normal limits  CBC - Abnormal;  Notable for the following components:   WBC 11.5 (*)    Hemoglobin 18.0 (*)    All other components within normal limits  URINALYSIS, ROUTINE W REFLEX MICROSCOPIC - Abnormal; Notable for the following components:   Specific Gravity, Urine >1.046 (*)    Hgb urine dipstick MODERATE (*)    Ketones, ur 40 (*)    Protein, ur 30 (*)    All other components within normal limits  LIPASE, BLOOD    EKG None  Radiology CT ABDOMEN PELVIS W CONTRAST Result Date: 01/13/2024 CLINICAL DATA:  Abdominal pain, acute, nonlocalized. Nausea, vomiting. EXAM: CT ABDOMEN AND PELVIS WITH CONTRAST TECHNIQUE: Multidetector CT imaging of the abdomen and pelvis was performed using the standard protocol following bolus administration of intravenous contrast. RADIATION DOSE  REDUCTION: This exam was performed according to the departmental dose-optimization program which includes automated exposure control, adjustment of the mA and/or kV according to patient size and/or use of iterative reconstruction technique. CONTRAST:  OMNIPAQUE IOHEXOL 300 MG/ML  SOLN COMPARISON:  01/24/2017 FINDINGS: Lower chest: No acute abnormality Hepatobiliary: No focal hepatic abnormality. Gallbladder unremarkable. Pancreas: No focal abnormality or ductal dilatation. Spleen: No focal abnormality.  Normal size. Adrenals/Urinary Tract: No suspicious renal or adrenal abnormality. No stones or hydronephrosis. Urinary bladder unremarkable. Stomach/Bowel: There is mild wall thickening involving multiple mid and distal small bowel loops concerning for infectious or inflammatory enteritis. No wall thickening within the colon. Scattered air-fluid levels within the colon may reflect diarrhea. Stomach is decompressed, unremarkable. Vascular/Lymphatic: Scattered aortic atherosclerosis. No evidence of aneurysm or adenopathy. Reproductive: No visible focal abnormality. Other: Trace free fluid in the cul-de-sac.  No free air. Musculoskeletal: No acute bony abnormality. IMPRESSION: Wall thickening involving multiple mid to distal small bowel loops concerning for infectious or inflammatory enteritis. No bowel obstruction. Aortic atherosclerosis. Electronically Signed   By: Janeece Mechanic M.D.   On: 01/13/2024 19:52    Procedures Procedures    Medications Ordered in ED Medications  ondansetron (ZOFRAN-ODT) disintegrating tablet 4 mg (4 mg Oral Given 01/13/24 1711)  lactated ringers bolus 1,000 mL (0 mLs Intravenous Stopped 01/13/24 1930)  iohexol (OMNIPAQUE) 300 MG/ML solution 100 mL (100 mLs Intravenous Contrast Given 01/13/24 1838)  ondansetron (ZOFRAN) injection 4 mg (4 mg Intravenous Given 01/13/24 2140)    ED Course/ Medical Decision Making/ A&P Clinical Course as of 01/13/24 2148  Fri Jan 13, 2024  1806  Hemoglobin(!): 18.0 [AF]    Clinical Course User Index [AF] Rexie Catena, PA-C                                 Medical Decision Making Amount and/or Complexity of Data Reviewed Labs: ordered. Decision-making details documented in ED Course. Radiology: ordered.  Risk Prescription drug management.     Differential diagnosis includes but is not limited to Cholelithiasis, cholangitis, choledocholithiasis, peptic ulcer, gastritis, gastroenteritis, appendicitis, IBS, IBD, DKA, nephrolithiasis, UTI, pyelonephritis, pancreatitis, diverticulitis, mesenteric ischemia, abdominal aortic aneurysm, small bowel obstruction, volvulus, testicular torsion in males, ovarian torsion and pregnancy related concerns in females of childbearing age    ED Course:  Upon initial evaluation, patient is well-appearing, stable vital signs aside from elevated blood pressure of 157/90.  No active vomiting.  Abdomen is soft and nontender, no rebound or guarding.  Suspect this is likely a gastroenteritis, however, given patient has had previous abdominal surgeries, will evaluate for potential complication or partial SBO with CT imaging.  Labs  Ordered: I Ordered, and personally interpreted labs.  The pertinent results include:   CBC with slight leukocytosis of 11.5.  Hemoglobin elevated at 18, but appears to be around baseline CMP without any elevation in LFTs or creatinine, normal electrolytes Lipase within normal limits Urinalysis with high specific gravity, ketones and proteinuria present.  Red blood cells present, no nitrites or leukocytes.  Imaging Studies ordered: I ordered imaging studies including CT abdomen pelvis I independently visualized the imaging with scope of interpretation limited to determining acute life threatening conditions related to emergency care. Imaging showed wall thickening involving multiple mid to distal small bowel loops concerning for infectious or inflammatory enteritis.No bowel  obstruction I agree with the radiologist interpretation   Medications Given: 1 LR bolus for dehydration Zofran for nausea  Upon re-evaluation, patient tolerating p.o. intake of water without difficulty.  We discussed scan results of enteritis. Discussed that diarrhea usually resolves on its own over the next couple days as this is usually viral in nature.  He does not have any signs of infectious diarrhea such as fever, bloody diarrhea, no recent travel.  However, given it has been 3 days without improvement, will provide prescription for azithromycin in case symptoms have not improved with home measures in the next 2 days.  Patient stable and appropriate for discharge home    Impression: Nausea and diarrhea, likely viral gastroenteritis   Disposition:  The patient was discharged home with instructions to eat bland diet at home and advance diet as tolerated.  We discussed that if symptoms not improved within the next 2 days, he should start the azithromycin prescribed.  Follow-up with PCP if symptoms not improved within the next 3 days.  Zofran as needed for nausea. Return precautions given.    This chart was dictated using voice recognition software, Dragon. Despite the best efforts of this provider to proofread and correct errors, errors may still occur which can change documentation meaning.          Final Clinical Impression(s) / ED Diagnoses Final diagnoses:  Enteritis    Rx / DC Orders ED Discharge Orders          Ordered    ondansetron (ZOFRAN-ODT) 4 MG disintegrating tablet  Every 8 hours PRN        01/13/24 2132    azithromycin (ZITHROMAX) 250 MG tablet   Once        01/13/24 2132              Kalena Mander, PA-C 01/13/24 2148    Mozell Arias, MD 01/14/24 1443

## 2024-01-13 NOTE — Telephone Encounter (Signed)
  Chief Complaint: Abdominal Pain Symptoms: generalized abdominal pain, vomiting and diarrhea Frequency: started Wednesday Pertinent Negatives: Patient denies fever Disposition: [] ED /[] Urgent Care (no appt availability in office) / [] Appointment(In office/virtual)/ []  [place] [facility]/ [] Home Care/ [] Refused Recommended Disposition /[] [place] [facility]/ [x]  Follow-up with PCP Additional Notes: patient calling with abdominal pain, vomiting and diarrhea since Wednesday. Patient states vomiting and diarrhea has calmed down. Patient states pain is 4 out of 10 but states pain increases with movement. Patient has a history of diverticulitis and states symptoms feel just like diverticulitis. Patient is asking for antibiotics to be sent to his pharmacy. Patient is asking for a phone call from staff. Patient is given instructions that if he doesn't hear from office by 4:45 PM-5:00 PM that Urgent Care or ED would be his next best option. Patient verbalized understanding and all questions answered.    Copied from CRM [id number]. Topic: Clinical - Red Word Triage >> Jan 13, 2024  2:13 PM Alessandra Bevels wrote: Red Word that prompted transfer to Nurse Triage: Patient is calling to report abdominal pain, with vomiting, and diarrhea since Wednesday. Please advise Reason for Disposition  [1] MILD-MODERATE pain AND [2] constant AND [3] present > 2 hours  Answer Assessment - Initial Assessment Questions 1. LOCATION: "Where does it hurt?"      Generalized abdominal pain 2. RADIATION: "Does the pain shoot anywhere else?" (e.g., chest, back)     No radiation 3. ONSET: "When did the pain begin?" (Minutes, hours or days ago)      Started on Wednesday 4. SUDDEN: "Gradual or sudden onset?"     Gradual and increased 5. PATTERN "Does the pain come and go, or is it constant?"    - If it comes and goes: "How long does it last?" "Do you have pain now?"     (Note: Comes and goes means the pain is intermittent. It  goes away completely between bouts.)    - If constant: "Is it getting better, staying the same, or getting worse?"      (Note: Constant means the pain never goes away completely; most serious pain is constant and gets worse.)      constant 6. SEVERITY: "How bad is the pain?"  (e.g., Scale 1-10; mild, moderate, or severe)    - MILD (1-3): Doesn't interfere with normal activities, abdomen soft and not tender to touch.     - MODERATE (4-7): Interferes with normal activities or awakens from sleep, abdomen tender to touch.     - SEVERE (8-10): Excruciating pain, doubled over, unable to do any normal activities.       4 out of 10 but increases with movement 7. RECURRENT SYMPTOM: "Have you ever had this type of stomach pain before?" If Yes, ask: "When was the last time?" and "What happened that time?"      Yes-six year ago 8. CAUSE: "What do you think is causing the stomach pain?"     Believes it to be diverticulitis 9. RELIEVING/AGGRAVATING FACTORS: "What makes it better or worse?" (e.g., antacids, bending or twisting motion, bowel movement)     Sitting makes it better 10. OTHER SYMPTOMS: "Do you have any other symptoms?" (e.g., back pain, diarrhea, fever, urination pain, vomiting)       Diarrhea, low grade fever on Wednesday  Protocols used: Abdominal Pain - Male-A-AH

## 2024-01-13 NOTE — Discharge Instructions (Addendum)
 You have inflammation of the colon seen on your CT scan today.  This likely is due to a stomach bug or infection from food you ate.  I have included the CT scan results below for your reference.  Generally symptoms resolve on their own within a week of onset and do not require any antibiotics.  Please eat a liquid foods such as smoothies, broth, soups, and then advance your diet as tolerated.  Please keep well-hydrated at home as your urine showed that you are very dehydrated here today.  You have been prescribed Zofran (ondansetron) for nausea and vomiting. You may take this every 8 hours as needed for nausea and vomiting. This medication dissolves under the tongue. You do not need to swallow it.   You have been prescribed azithromycin. Please take this antibiotic if your symptoms do not start to improve within the next 2 days of a liquid diet.   If your symptoms do not start to improve within the next 3 days, please visit your PCP for a follow-up visit.  Return to the ER if you have any uncontrolled vomiting, severe abdominal pain, fevers, any other new or concerning symptoms.  "EXAM: CT ABDOMEN AND PELVIS WITH CONTRAST   TECHNIQUE: Multidetector CT imaging of the abdomen and pelvis was performed using the standard protocol following bolus administration of intravenous contrast.   RADIATION DOSE REDUCTION: This exam was performed according to the departmental dose-optimization program which includes automated exposure control, adjustment of the mA and/or kV according to patient size and/or use of iterative reconstruction technique.   CONTRAST:  OMNIPAQUE IOHEXOL 300 MG/ML  SOLN   COMPARISON:  01/24/2017   FINDINGS: Lower chest: No acute abnormality   Hepatobiliary: No focal hepatic abnormality. Gallbladder unremarkable.   Pancreas: No focal abnormality or ductal dilatation.   Spleen: No focal abnormality.  Normal size.   Adrenals/Urinary Tract: No suspicious renal or  adrenal abnormality. No stones or hydronephrosis. Urinary bladder unremarkable.   Stomach/Bowel: There is mild wall thickening involving multiple mid and distal small bowel loops concerning for infectious or inflammatory enteritis. No wall thickening within the colon. Scattered air-fluid levels within the colon may reflect diarrhea. Stomach is decompressed, unremarkable.   Vascular/Lymphatic: Scattered aortic atherosclerosis. No evidence of aneurysm or adenopathy.   Reproductive: No visible focal abnormality.   Other: Trace free fluid in the cul-de-sac.  No free air.   Musculoskeletal: No acute bony abnormality.   IMPRESSION: Wall thickening involving multiple mid to distal small bowel loops concerning for infectious or inflammatory enteritis. No bowel obstruction.   Aortic atherosclerosis.     Electronically Signed   By: Charlett Nose M.D.   On: 01/13/2024 19:52"

## 2024-01-13 NOTE — ED Notes (Signed)
 Patient aware the need for Urine sample

## 2024-01-19 ENCOUNTER — Other Ambulatory Visit: Payer: Self-pay

## 2024-01-19 DIAGNOSIS — Z122 Encounter for screening for malignant neoplasm of respiratory organs: Secondary | ICD-10-CM

## 2024-01-19 DIAGNOSIS — Z87891 Personal history of nicotine dependence: Secondary | ICD-10-CM

## 2024-01-20 ENCOUNTER — Inpatient Hospital Stay: Payer: Managed Care, Other (non HMO)

## 2024-01-26 ENCOUNTER — Inpatient Hospital Stay: Attending: Hematology

## 2024-01-26 DIAGNOSIS — F17210 Nicotine dependence, cigarettes, uncomplicated: Secondary | ICD-10-CM | POA: Diagnosis not present

## 2024-01-26 DIAGNOSIS — Z8041 Family history of malignant neoplasm of ovary: Secondary | ICD-10-CM | POA: Diagnosis not present

## 2024-01-26 DIAGNOSIS — Z803 Family history of malignant neoplasm of breast: Secondary | ICD-10-CM | POA: Insufficient documentation

## 2024-01-26 DIAGNOSIS — Z122 Encounter for screening for malignant neoplasm of respiratory organs: Secondary | ICD-10-CM

## 2024-01-26 DIAGNOSIS — Z87891 Personal history of nicotine dependence: Secondary | ICD-10-CM

## 2024-01-26 DIAGNOSIS — Z79899 Other long term (current) drug therapy: Secondary | ICD-10-CM | POA: Insufficient documentation

## 2024-01-26 LAB — COMPREHENSIVE METABOLIC PANEL WITH GFR
ALT: 14 U/L (ref 0–44)
AST: 19 U/L (ref 15–41)
Albumin: 3.8 g/dL (ref 3.5–5.0)
Alkaline Phosphatase: 73 U/L (ref 38–126)
Anion gap: 6 (ref 5–15)
BUN: 16 mg/dL (ref 6–20)
CO2: 22 mmol/L (ref 22–32)
Calcium: 8.7 mg/dL — ABNORMAL LOW (ref 8.9–10.3)
Chloride: 106 mmol/L (ref 98–111)
Creatinine, Ser: 0.89 mg/dL (ref 0.61–1.24)
GFR, Estimated: >60 mL/min (ref >60)
Glucose, Bld: 111 mg/dL — ABNORMAL HIGH (ref 70–99)
Potassium: 4 mmol/L (ref 3.5–5.1)
Sodium: 134 mmol/L — ABNORMAL LOW (ref 135–145)
Total Bilirubin: 0.3 mg/dL (ref 0.0–1.2)
Total Protein: 6.8 g/dL (ref 6.5–8.1)

## 2024-01-26 LAB — CBC WITH DIFFERENTIAL/PLATELET
Abs Immature Granulocytes: 0.02 10*3/uL (ref 0.00–0.07)
Basophils Absolute: 0.1 10*3/uL (ref 0.0–0.1)
Basophils Relative: 1 %
Eosinophils Absolute: 0.2 10*3/uL (ref 0.0–0.5)
Eosinophils Relative: 2 %
HCT: 47 % (ref 39.0–52.0)
Hemoglobin: 16.6 g/dL (ref 13.0–17.0)
Immature Granulocytes: 0 %
Lymphocytes Relative: 24 %
Lymphs Abs: 1.9 10*3/uL (ref 0.7–4.0)
MCH: 32.2 pg (ref 26.0–34.0)
MCHC: 35.3 g/dL (ref 30.0–36.0)
MCV: 91.3 fL (ref 80.0–100.0)
Monocytes Absolute: 0.6 10*3/uL (ref 0.1–1.0)
Monocytes Relative: 8 %
Neutro Abs: 5.3 10*3/uL (ref 1.7–7.7)
Neutrophils Relative %: 65 %
Platelets: 302 10*3/uL (ref 150–400)
RBC: 5.15 MIL/uL (ref 4.22–5.81)
RDW: 11.3 % — ABNORMAL LOW (ref 11.5–15.5)
WBC: 8 10*3/uL (ref 4.0–10.5)
nRBC: 0 % (ref 0.0–0.2)

## 2024-01-26 LAB — IRON AND TIBC
Iron: 191 ug/dL — ABNORMAL HIGH (ref 45–182)
Saturation Ratios: 77 % — ABNORMAL HIGH (ref 17.9–39.5)
TIBC: 247 ug/dL — ABNORMAL LOW (ref 250–450)
UIBC: 56 ug/dL

## 2024-01-26 LAB — FERRITIN: Ferritin: 70 ng/mL (ref 24–336)

## 2024-01-27 ENCOUNTER — Inpatient Hospital Stay: Payer: Managed Care, Other (non HMO) | Admitting: Oncology

## 2024-01-27 ENCOUNTER — Inpatient Hospital Stay: Payer: Managed Care, Other (non HMO)

## 2024-01-27 DIAGNOSIS — Z87891 Personal history of nicotine dependence: Secondary | ICD-10-CM | POA: Diagnosis not present

## 2024-01-27 DIAGNOSIS — Z122 Encounter for screening for malignant neoplasm of respiratory organs: Secondary | ICD-10-CM

## 2024-01-27 NOTE — Progress Notes (Signed)
 Hemoglobin on 01/26/24 was 16.6 and hematocrit was 47.0.  No therapeutic phlebotomy today per Charlton Cooler, NP.

## 2024-01-27 NOTE — Progress Notes (Signed)
 Garrett Lin [address], [place] [address]   CLINIC:  Medical Oncology/Hematology  PCP:  Albertha Huger, FNP [address] [place] [address] [phone]   REASON FOR VISIT:  Follow-up for hereditary hemochromatosis   PRIOR THERAPY: Weekly phlebotomy (500 mL each)   CURRENT THERAPY: Weekly phlebotomy (250 mL each)  INTERVAL HISTORY:   Garrett Lin 63 y.o. male returns for routine follow-up of hereditary hemochromatosis.  He was last seen by me on 09/16/2023.  Patient has been receiving phlebotomies of 250 mL over the past few months for ferritin greater than 50.  Reports increased fatigue following phlebotomy if they take off more than 250 mL.  His last phlebotomy was on 12/23/2023.  He has been receiving them approximately every month.  He was evaluated at [facility] on 01/13/2024 for enteritis treated with azithromycin  and Zofran . Slowly recovering from enerteritis.  Reports he would prefer not to have a phlebotomy today if he does not need it because his energy levels are still low after all of the vomiting and diarrhea he had a couple weeks ago.  Overall, he feels well.  Appetite is 75% and energy levels are 50%.  Denies any pain.   ASSESSMENT & PLAN:  1.  Hereditary hemochromatosis (C282Y homozygous) - Severe hepatic iron deposition was incidental finding on recent MRI abdomen (06/11/2021), which was obtained by PCP for monitoring of previously noted hepatic cyst. - MRI abdomen (06/11/2021) showed severe diffuse hepatic iron deposition, as well as tiny < 5 mm cyst which correlates with lesion seen on prior CT; no evidence of hepatic neoplasm. - Hemochromatosis DNA testing shows homozygosity for C282Y HFE gene (07/23/2021).  No abnormalities on hepatic function panel.  Viral hepatitis panel was negative. - Cardiac MRI (09/16/2021) was negative for cardiac hemochromatosis. - At presentation, he had symptomatic iron overload with fatigue, joint pain,  and abdominal pain after eating    - Discussed  course of untreated hereditary hemochromatosis with risk of cirrhosis, hepatocellular carcinoma, heart failure, and arrhythmia.  Iron overload also increases risk of certain bacterial infections. - We have discussed lifestyle modifications such as avoiding alcohol and iron supplementation.  Recent guidelines do not find that moderate red meat intake adversely affects patients with hemochromatosis.  Patient should avoid raw fish or undercooked meat due to risk of certain bacterial infections to thrive and iron rich environments. - We have discussed phlebotomy protocol for treatment of hemochromatosis: Weekly phlebotomy as tolerated until goal ferritin is reached. If Hgb drops < 11.0, we will decrease frequency of phlebotomy. If patient experiences severe side effects or hemodynamic instability, we will decrease amount taken for phlebotomy. Goal is ferritin < 50 to reverse hepatic iron deposition. Once iron stores have normalized, we will decrease frequency of phlebotomy. - Initially started on weekly 500 mL phlebotomy, decreased to weekly 250 mL phlebotomy due to significant fatigue after full phlebotomy.   2.  Tobacco abuse - Patient has been smoking 1.0-1.5 PPD since age 37. - This patient meets criteria for low-dose CT lung cancer screening (age 44-80 with a 20+ pack year history, current everyday smoker /OR/ quit < 15 years ago, no current signs or symptoms of lung cancer) - Shared decision making visit completed on 11/06/2021 - Low-dose CT chest (03/05/2022): Pulmonary nodules, lung RADS 2 (benign appearance or behavior); partially calcified bilateral pleural plaques indicative of asbestos-related pleural disease, aortic atherosclerosis, emphysema, coronary artery disease -Low-dose CT scan from 04/08/2023 shows lung RADS 2 benign  appearance.    3.  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 - SUBSTANCE: He smokes 1.0-1.5 PPD cigarettes since age 78.  He drinks socially, usually 1 or 2 mixed drinks once or twice a year.  He has a previous history of heavy alcohol use, but quit 26 years ago (age 48).  He denies any illicit drug use. - FAMILY: No family history of liver disease, hemochromatosis, blood disorders, or iron disorders.  Patient's mother had breast cancer.  PLAN: 1. Hereditary hemochromatosis (HCC) (Primary) - Lab from 01/26/2024 show an iron saturation of 77% and ferritin of 70.  Hemoglobin is 16.6 and platelet count is normal at 302.  CMP is unremarkable. -Plan is to keep ferritin below 100 with monthly phlebotomies of 250 mL given worsening fatigue following 500 ml phlebotomy. -He does not need a phlebotomy today. -Return to clinic monthly for labs and phlebotomy and in 4 months for labs, phlebotomy and assessment.   - Once ferritin <50, will transition to outpatient blood donation once every 3 months with annual hematology follow-up  2. Personal history of tobacco use, presenting hazards to health -Most recent low-dose CT scan from 04/08/2023 was read as lung RADS 2 and recommended follow-up in 1 year.  Will repeat low-dose CT scan in July 2025.    PLAN SUMMARY: >> No phlebotomy today. >>RTC Monthly for phlebotomy (250 mL) >> Labs in 4 months = CBC/D, CMP, ferritin, iron/TIBC >> Low-dose CT scan in July 2025.  Will get that scheduled today. >> OFFICE visit in 4 months (after labs).      REVIEW OF SYSTEMS:   Review of Systems  Constitutional:  Positive for fatigue.    PHYSICAL EXAM:  ECOG PERFORMANCE STATUS: 1 - Symptomatic but completely ambulatory  There were no vitals filed for this visit. There were no vitals filed for this visit. Physical Exam Constitutional:      Appearance: Normal appearance.  Cardiovascular:      Rate and Rhythm: Normal rate and regular rhythm.  Pulmonary:     Effort: Pulmonary effort is normal.     Breath sounds: Normal breath sounds.  Abdominal:     General: Bowel sounds are normal.     Palpations: Abdomen is soft.  Musculoskeletal:        General: No swelling. Normal range of motion.  Neurological:     Mental Status: He is alert and oriented to person, place, and time. Mental status is at baseline.     PAST MEDICAL/SURGICAL HISTORY:  Past Medical History:  Diagnosis Date   Chronic pain    right forearm.   Hereditary hemochromatosis (HCC) 08/07/2021   Irritable bowel syndrome (IBS) 2006, 2007   "nervous stomach" abdominal pain   Small bowel obstruction due to adhesions (HCC) 09/02/2013   Past Surgical History:  Procedure Laterality Date   COLONOSCOPY  2006   Jolinda Necessary MD   COLONOSCOPY WITH PROPOFOL  N/A 08/11/2021   Procedure: COLONOSCOPY WITH PROPOFOL ;  Surgeon: Vinetta Greening, DO;  Location: [facility];  Service: Endoscopy;  Laterality: N/A;  9:00 / ASA II   ESOPHAGOGASTRODUODENOSCOPY  2006   Logan Rings MD   LAPAROSCOPIC LYSIS OF ADHESIONS N/A 09/02/2013   Procedure: LAPAROSCOPIC LYSIS OF ADHESIONS ;  Surgeon: Levert Ready,  MD;  Location: [facility] OR;  Service: General;  Laterality: N/A;   SHOULDER ARTHROSCOPY Right 04/2014   Dr. Brunilda Capra   surgery for right forarm trauma     work related machine injury    SOCIAL HISTORY:  Social History   Socioeconomic History   Marital status: Married    Spouse name: Not on file   Number of children: Not on file   Years of education: Not on file   Highest education level: Not on file  Occupational History   Not on file  Tobacco Use   Smoking status: Every Day    Current packs/day: 1.00    Average packs/day: 1 pack/day for 38.0 years (38.0 ttl pk-yrs)    Types: Cigarettes   Smokeless tobacco: Never  Vaping Use   Vaping status: Never Used  Substance and Sexual Activity   Alcohol use: No   Drug use: No    Sexual activity: Not on file  Other Topics Concern   Not on file  Social History Narrative   Not on file   Social Drivers of Health   Financial Resource Strain: Not on file  Food Insecurity: Not on file  Transportation Needs: Not on file  Physical Activity: Not on file  Stress: Not on file  Social Connections: Not on file  Intimate Partner Violence: Not on file    FAMILY HISTORY:  Family History  Problem Relation Age of Onset   Ovarian cancer Sister     CURRENT MEDICATIONS:  Outpatient Encounter Medications as of 01/27/2024  Medication Sig   meloxicam  (MOBIC ) 15 MG tablet Take 1 tablet (15 mg total) by mouth daily.   ondansetron  (ZOFRAN -ODT) 4 MG disintegrating tablet Take 1 tablet (4 mg total) by mouth every 8 (eight) hours as needed for nausea or vomiting.   rosuvastatin  (CRESTOR ) 5 MG tablet Take 1 tablet (5 mg total) by mouth daily.   No facility-administered encounter medications on file as of 01/27/2024.    ALLERGIES:  No Known Allergies  LABORATORY DATA:  I have reviewed the labs as listed.  CBC    Component Value Date/Time   WBC 8.0 01/26/2024 0826   RBC 5.15 01/26/2024 0826   HGB 16.6 01/26/2024 0826   HGB 17.3 05/13/2021 0954   HCT 47.0 01/26/2024 0826   HCT 50.8 05/13/2021 0954   PLT 302 01/26/2024 0826   PLT 325 05/13/2021 0954   MCV 91.3 01/26/2024 0826   MCV 93 05/13/2021 0954   MCH 32.2 01/26/2024 0826   MCHC 35.3 01/26/2024 0826   RDW 11.3 (L) 01/26/2024 0826   RDW 12.1 05/13/2021 0954   LYMPHSABS 1.9 01/26/2024 0826   LYMPHSABS 2.0 05/13/2021 0954   MONOABS 0.6 01/26/2024 0826   EOSABS 0.2 01/26/2024 0826   EOSABS 0.2 05/13/2021 0954   BASOSABS 0.1 01/26/2024 0826   BASOSABS 0.1 05/13/2021 0954      Latest Ref Rng & Units 01/26/2024    8:26 AM 01/13/2024    5:11 PM 10/21/2023    8:38 AM  CMP  Glucose 70 - 99 mg/dL 147  829  562   BUN 6 - 20 mg/dL 16  19  18    Creatinine 0.61 - 1.24 mg/dL 1.30  8.65  7.84   Sodium 135 - 145 mmol/L 134   135  135   Potassium 3.5 - 5.1 mmol/L 4.0  3.6  5.0   Chloride 98 - 111 mmol/L 106  98  105   CO2 22 - 32 mmol/L 22  26  27   Calcium  8.9 - 10.3 mg/dL 8.7  9.2  9.3   Total Protein 6.5 - 8.1 g/dL 6.8  7.7  7.6   Total Bilirubin 0.0 - 1.2 mg/dL 0.3  0.9  0.6   Alkaline Phos 38 - 126 U/L 73  74  78   AST 15 - 41 U/L 19  17  19    ALT 0 - 44 U/L 14  15  16      DIAGNOSTIC IMAGING:  I have independently reviewed the relevant imaging and discussed with the patient.   WRAP UP:  All questions were answered. The patient knows to call the clinic with any problems, questions or concerns.  Medical decision making: Moderate  Time spent on visit: I spent 20 minutes dedicated to the care of this patient (face-to-face and non-face-to-face) on the date of the encounter to include what is described in the assessment and plan.   Aurther Blue, NP  01/27/24 8:54 AM

## 2024-02-24 ENCOUNTER — Inpatient Hospital Stay: Attending: Hematology

## 2024-02-24 NOTE — Patient Instructions (Signed)
 [facility] [place] - A DEPT OF [facility]  Discharge Instructions: Thank you for choosing [facility] to provide your oncology and hematology care.  If you have a lab appointment with the Cancer Center - please note that after April 8th, 2024, all labs will be drawn in the cancer center.  You do not have to check in or register with the main entrance as you have in the past but will complete your check-in in the cancer center.  Wear comfortable clothing and clothing appropriate for easy access to any Portacath or PICC line.   We strive to give you quality time with your provider. You may need to reschedule your appointment if you arrive late (15 or more minutes).  Arriving late affects you and other patients whose appointments are after yours.  Also, if you miss three or more appointments without notifying the office, you may be dismissed from the clinic at the provider's discretion.      For prescription refill requests, have your pharmacy contact our office and allow 72 hours for refills to be completed.    Today you received the following Phlebotomy, return as scheduled.   To help prevent nausea and vomiting after your treatment, we encourage you to take your nausea medication as directed.  BELOW ARE SYMPTOMS THAT SHOULD BE REPORTED IMMEDIATELY: *FEVER GREATER THAN 100.4 F (38 C) OR HIGHER *CHILLS OR SWEATING *NAUSEA AND VOMITING THAT IS NOT CONTROLLED WITH YOUR NAUSEA MEDICATION *UNUSUAL SHORTNESS OF BREATH *UNUSUAL BRUISING OR BLEEDING *URINARY PROBLEMS (pain or burning when urinating, or frequent urination) *BOWEL PROBLEMS (unusual diarrhea, constipation, pain near the anus) TENDERNESS IN MOUTH AND THROAT WITH OR WITHOUT PRESENCE OF ULCERS (sore throat, sores in mouth, or a toothache) UNUSUAL RASH, SWELLING OR PAIN  UNUSUAL VAGINAL DISCHARGE OR ITCHING   Items with * indicate a potential emergency and should be followed up as soon as possible  or go to the Emergency Department if any problems should occur.  Please show the CHEMOTHERAPY ALERT CARD or IMMUNOTHERAPY ALERT CARD at check-in to the Emergency Department and triage nurse.  Should you have questions after your visit or need to cancel or reschedule your appointment, please contact [facility] [place] - A DEPT OF [facility] [phone]  and follow the prompts.  Office hours are 8:00 a.m. to 4:30 p.m. Monday - Friday. Please note that voicemails left after 4:00 p.m. may not be returned until the following business day.  We are closed weekends and major holidays. You have access to a nurse at all times for urgent questions. Please call the main number to the clinic [phone] and follow the prompts.  For any non-urgent questions, you may also contact your provider using MyChart. We now offer e-Visits for anyone 27 and older to request care online for non-urgent symptoms. For details visit [email or web address].   Also download the MyChart app! Go to the app store, search "MyChart", open the app, select [place], and log in with your MyChart username and password.

## 2024-02-24 NOTE — Progress Notes (Signed)
 Garrett Lin presents today for phlebotomy per MD orders. Phlebotomy procedure started at 1103 and ended at 1106. 250 cc removed. Patient tolerated procedure well. IV needle removed intact.

## 2024-03-23 ENCOUNTER — Inpatient Hospital Stay: Attending: Hematology

## 2024-03-23 NOTE — Patient Instructions (Signed)

## 2024-03-23 NOTE — Progress Notes (Signed)
 Garrett Lin presents today for phlebotomy per MD orders. Phlebotomy procedure started at 0816 and ended at 0822. 250 cc removed. Patient tolerated procedure well. IV needle removed intact.  Araly Kaas [employer]

## 2024-04-13 ENCOUNTER — Ambulatory Visit (HOSPITAL_COMMUNITY)
Admission: RE | Admit: 2024-04-13 | Discharge: 2024-04-13 | Disposition: A | Discharge status: Home / Self Care | Source: Ambulatory Visit | Attending: Oncology | Admitting: Oncology

## 2024-04-13 DIAGNOSIS — Z122 Encounter for screening for malignant neoplasm of respiratory organs: Secondary | ICD-10-CM | POA: Diagnosis present

## 2024-04-13 DIAGNOSIS — Z87891 Personal history of nicotine dependence: Secondary | ICD-10-CM | POA: Diagnosis present

## 2024-04-20 ENCOUNTER — Inpatient Hospital Stay: Attending: Hematology

## 2024-04-20 NOTE — Patient Instructions (Signed)
 [facility] [place] - A DEPT OF [facility]  Discharge Instructions: Thank you for choosing [facility] to provide your oncology and hematology care.  If you have a lab appointment with the Cancer Center - please note that after April 8th, 2024, all labs will be drawn in the cancer center.  You do not have to check in or register with the main entrance as you have in the past but will complete your check-in in the cancer center.  Wear comfortable clothing and clothing appropriate for easy access to any Portacath or PICC line.   We strive to give you quality time with your provider. You may need to reschedule your appointment if you arrive late (15 or more minutes).  Arriving late affects you and other patients whose appointments are after yours.  Also, if you miss three or more appointments without notifying the office, you may be dismissed from the clinic at the provider's discretion.      For prescription refill requests, have your pharmacy contact our office and allow 72 hours for refills to be completed.    Today you received the following phlebotomy.  Therapeutic Phlebotomy Therapeutic phlebotomy is the planned removal of blood from a person's body for the purpose of treating a medical condition. The procedure is lot like donating blood. Usually, about a pint (470 mL, or 0.47 L) of blood is removed. The average adult has 9-12 pints (4.3-5.7 L) of blood in his or her body. Therapeutic phlebotomy may be used to treat the following medical conditions: Hemochromatosis. This is a condition in which the blood contains too much iron. Polycythemia vera. This is a condition in which the blood contains too many red blood cells. Porphyria cutanea tarda. This is a disease in which an important part of hemoglobin is not made properly. It results in the buildup of abnormal amounts of porphyrins in the body. Sickle cell disease. This is a condition in which the red  blood cells form an abnormal crescent shape rather than a round shape. Tell a health care provider about: Any allergies you have. All medicines you are taking, including vitamins, herbs, eye drops, creams, and over-the-counter medicines. Any bleeding problems you have. Any surgeries you have had. Any medical conditions you have. Whether you are pregnant or may be pregnant. What are the risks? Generally, this is a safe procedure. However, problems may occur, including: Nausea or light-headedness. Low blood pressure (hypotension). Soreness, bleeding, swelling, or bruising at the needle insertion site. Infection. What happens before the procedure? Ask your health care provider about: Changing or stopping your regular medicines. This is especially important if you are taking diabetes medicines or blood thinners. Taking medicines such as aspirin and ibuprofen . These medicines can thin your blood. Do not take these medicines unless your health care provider tells you to take them. Taking over-the-counter medicines, vitamins, herbs, and supplements. Wear clothing with sleeves that can be raised above the elbow. You may have a blood sample taken. Your blood pressure, pulse rate, and breathing rate will be measured. What happens during the procedure?  You may be given a medicine to numb the area (local anesthetic). A tourniquet will be placed on your arm. A needle will be put into one of your veins. Tubing and a collection bag will be attached to the needle. Blood will flow through the needle and tubing into the collection bag. The collection bag will be placed lower than your arm so gravity  can help the blood flow into the bag. You may be asked to open and close your hand slowly and continually during the entire collection. After the specified amount of blood has been removed from your body, the collection bag and tubing will be clamped. The needle will be removed from your vein. Pressure  will be held on the needle site to stop the bleeding. A bandage (dressing) will be placed over the needle insertion site. The procedure may vary among health care providers and hospitals. What happens after the procedure? Your blood pressure, pulse rate, and breathing rate will be measured after the procedure. You will be encouraged to drink fluids. You will be encouraged to eat a snack to prevent a low blood sugar level. Your recovery will be assessed and monitored. Return to your normal activities as told by your health care provider. Summary Therapeutic phlebotomy is the planned removal of blood from a person's body for the purpose of treating a medical condition. Therapeutic phlebotomy may be used to treat hemochromatosis, polycythemia vera, porphyria cutanea tarda, or sickle cell disease. In the procedure, a needle is inserted and about a pint (470 mL, or 0.47 L) of blood is removed. The average adult has 9-12 pints (4.3-5.7 L) of blood in the body. This is generally a safe procedure, but it can sometimes cause problems such as nausea, light-headedness, or low blood pressure (hypotension). This information is not intended to replace advice given to you by your health care provider. Make sure you discuss any questions you have with your health care provider. Document Revised: 03/18/2021 Document Reviewed: 03/18/2021 Elsevier Patient Education  2024 Elsevier Inc.   To help prevent nausea and vomiting after your treatment, we encourage you to take your nausea medication as directed.  BELOW ARE SYMPTOMS THAT SHOULD BE REPORTED IMMEDIATELY: *FEVER GREATER THAN 100.4 F (38 C) OR HIGHER *CHILLS OR SWEATING *NAUSEA AND VOMITING THAT IS NOT CONTROLLED WITH YOUR NAUSEA MEDICATION *UNUSUAL SHORTNESS OF BREATH *UNUSUAL BRUISING OR BLEEDING *URINARY PROBLEMS (pain or burning when urinating, or frequent urination) *BOWEL PROBLEMS (unusual diarrhea, constipation, pain near the anus) TENDERNESS IN  MOUTH AND THROAT WITH OR WITHOUT PRESENCE OF ULCERS (sore throat, sores in mouth, or a toothache) UNUSUAL RASH, SWELLING OR PAIN  UNUSUAL VAGINAL DISCHARGE OR ITCHING   Items with * indicate a potential emergency and should be followed up as soon as possible or go to the Emergency Department if any problems should occur.  Please show the CHEMOTHERAPY ALERT CARD or IMMUNOTHERAPY ALERT CARD at check-in to the Emergency Department and triage nurse.  Should you have questions after your visit or need to cancel or reschedule your appointment, please contact [facility] [place] - A DEPT OF [facility] [phone]  and follow the prompts.  Office hours are 8:00 a.m. to 4:30 p.m. Monday - Friday. Please note that voicemails left after 4:00 p.m. may not be returned until the following business day.  We are closed weekends and major holidays. You have access to a nurse at all times for urgent questions. Please call the main number to the clinic [phone] and follow the prompts.  For any non-urgent questions, you may also contact your provider using MyChart. We now offer e-Visits for anyone 92 and older to request care online for non-urgent symptoms. For details visit [email or web address].   Also download the MyChart app! Go to the app store, search MyChart, open the app, select [place], and log in with your MyChart  username and password.

## 2024-04-20 NOTE — Progress Notes (Signed)
 Sharolyn LELON Lamer presents today for theraputic phlebotomy per MD orders. Last hgb/hct on 04/19/24 was 16.6/47, Ferritin 70. VSS prior to procedure. Pt reports eating before arrival. Procedure started at 1323 using patients right AC. 250 grams of blood removed. Procedure ended at 1325. Gauze and coban applied to [facility], site clean and dry. VSS upon completion of procedure. Pt denies dizziness, lightheadedness, or feeling faint. Patient observed for 15 minuted post procedure but refused to stay the full 30 minutes post procedure time. Discharged in satisfactory condition with follow up instructions.

## 2024-04-26 ENCOUNTER — Encounter: Payer: Self-pay | Admitting: Family Medicine

## 2024-04-26 ENCOUNTER — Ambulatory Visit: Payer: Managed Care, Other (non HMO) | Admitting: Family Medicine

## 2024-04-26 VITALS — BP 124/78 | HR 78 | Temp 97.6°F | Ht 70.0 in | Wt 151.6 lb

## 2024-04-26 DIAGNOSIS — I700 Atherosclerosis of aorta: Secondary | ICD-10-CM

## 2024-04-26 DIAGNOSIS — Z130 Encounter for screening for diseases of the blood and blood-forming organs and certain disorders involving the immune mechanism: Secondary | ICD-10-CM

## 2024-04-26 DIAGNOSIS — E782 Mixed hyperlipidemia: Secondary | ICD-10-CM | POA: Diagnosis not present

## 2024-04-26 DIAGNOSIS — Z0001 Encounter for general adult medical examination with abnormal findings: Secondary | ICD-10-CM | POA: Diagnosis not present

## 2024-04-26 DIAGNOSIS — Z0000 Encounter for general adult medical examination without abnormal findings: Secondary | ICD-10-CM

## 2024-04-26 NOTE — Patient Instructions (Signed)
 Health Maintenance, Male  Adopting a healthy lifestyle and getting preventive care are important in promoting health and wellness. Ask your health care provider about:  The right schedule for you to have regular tests and exams.  Things you can do on your own to prevent diseases and keep yourself healthy.  What should I know about diet, weight, and exercise?  Eat a healthy diet    Eat a diet that includes plenty of vegetables, fruits, low-fat dairy products, and lean protein.  Do not eat a lot of foods that are high in solid fats, added sugars, or sodium.  Maintain a healthy weight  Body mass index (BMI) is a measurement that can be used to identify possible weight problems. It estimates body fat based on height and weight. Your health care provider can help determine your BMI and help you achieve or maintain a healthy weight.  Get regular exercise  Get regular exercise. This is one of the most important things you can do for your health. Most adults should:  Exercise for at least 150 minutes each week. The exercise should increase your heart rate and make you sweat (moderate-intensity exercise).  Do strengthening exercises at least twice a week. This is in addition to the moderate-intensity exercise.  Spend less time sitting. Even light physical activity can be beneficial.  Watch cholesterol and blood lipids  Have your blood tested for lipids and cholesterol at 61 years of age, then have this test every 5 years.  You may need to have your cholesterol levels checked more often if:  Your lipid or cholesterol levels are high.  You are older than 61 years of age.  You are at high risk for heart disease.  What should I know about cancer screening?  Many types of cancers can be detected early and may often be prevented. Depending on your health history and family history, you may need to have cancer screening at various ages. This may include screening for:  Colorectal cancer.  Prostate cancer.  Skin cancer.  Lung  cancer.  What should I know about heart disease, diabetes, and high blood pressure?  Blood pressure and heart disease  High blood pressure causes heart disease and increases the risk of stroke. This is more likely to develop in people who have high blood pressure readings or are overweight.  Talk with your health care provider about your target blood pressure readings.  Have your blood pressure checked:  Every 3-5 years if you are 9-95 years of age.  Every year if you are 85 years old or older.  If you are between the ages of 29 and 29 and are a current or former smoker, ask your health care provider if you should have a one-time screening for abdominal aortic aneurysm (AAA).  Diabetes  Have regular diabetes screenings. This checks your fasting blood sugar level. Have the screening done:  Once every three years after age 23 if you are at a normal weight and have a low risk for diabetes.  More often and at a younger age if you are overweight or have a high risk for diabetes.  What should I know about preventing infection?  Hepatitis B  If you have a higher risk for hepatitis B, you should be screened for this virus. Talk with your health care provider to find out if you are at risk for hepatitis B infection.  Hepatitis C  Blood testing is recommended for:  Everyone born from [birth year] through 1963.  Anyone  with known risk factors for hepatitis C.  Sexually transmitted infections (STIs)  You should be screened each year for STIs, including gonorrhea and chlamydia, if:  You are sexually active and are younger than 62 years of age.  You are older than 61 years of age and your health care provider tells you that you are at risk for this type of infection.  Your sexual activity has changed since you were last screened, and you are at increased risk for chlamydia or gonorrhea. Ask your health care provider if you are at risk.  Ask your health care provider about whether you are at high risk for HIV. Your health care provider  may recommend a prescription medicine to help prevent HIV infection. If you choose to take medicine to prevent HIV, you should first get tested for HIV. You should then be tested every 3 months for as long as you are taking the medicine.  Follow these instructions at home:  Alcohol use  Do not drink alcohol if your health care provider tells you not to drink.  If you drink alcohol:  Limit how much you have to 0-2 drinks a day.  Know how much alcohol is in your drink. In the [place] one drink equals one 12 oz bottle of beer (355 mL), one 5 oz glass of wine (148 mL), or one 1 oz glass of hard liquor (44 mL).  Lifestyle  Do not use any products that contain nicotine or tobacco. These products include cigarettes, chewing tobacco, and vaping devices, such as e-cigarettes. If you need help quitting, ask your health care provider.  Do not use street drugs.  Do not share needles.  Ask your health care provider for help if you need support or information about quitting drugs.  General instructions  Schedule regular health, dental, and eye exams.  Stay current with your vaccines.  Tell your health care provider if:  You often feel depressed.  You have ever been abused or do not feel safe at home.  Summary  Adopting a healthy lifestyle and getting preventive care are important in promoting health and wellness.  Follow your health care provider's instructions about healthy diet, exercising, and getting tested or screened for diseases.  Follow your health care provider's instructions on monitoring your cholesterol and blood pressure.  This information is not intended to replace advice given to you by your health care provider. Make sure you discuss any questions you have with your health care provider.  Document Revised: 02/09/2021 Document Reviewed: 02/09/2021  Elsevier Patient Education  2024 [employer].

## 2024-04-26 NOTE — Progress Notes (Signed)
 Complete physical exam  Patient: Garrett Lin   DOB: 13-Feb-1961   63 y.o. Male  MRN: [id number]  Subjective:    Chief Complaint  Patient presents with   Annual Exam    Garrett Lin is a 63 y.o. male who presents today for a complete physical exam. He reports consuming a general diet. The patient has a physically strenuous job, but has no regular exercise apart from work.  He generally feels well. He reports sleeping well. He does not have additional problems to discuss today.   He stopped taking his crestor  a few months ago. Since stopping he has not had any more joint pain and has not needed to take mobic  since.   Most recent fall risk assessment:    10/06/2023    9:00 AM  Fall Risk   Falls in the past year? 0     Most recent depression screenings:    10/06/2023    9:00 AM 03/18/2023    8:32 AM  PHQ 2/9 Scores  PHQ - 2 Score 0 0  PHQ- 9 Score 0 0    Vision:Within last year and Dental: No current dental problems  Past Medical History:  Diagnosis Date   Chronic pain    right forearm.   Hereditary hemochromatosis (HCC) 08/07/2021   Irritable bowel syndrome (IBS) 2006, 2007   nervous stomach abdominal pain   Small bowel obstruction due to adhesions (HCC) 09/02/2013      Patient Care Team: Joesph Garrett HERO, FNP as PCP - General (Family Medicine) Cindie Carlin POUR, DO as Consulting Physician (Gastroenterology)   Outpatient Medications Prior to Visit  Medication Sig   [DISCONTINUED] meloxicam  (MOBIC ) 15 MG tablet Take 1 tablet (15 mg total) by mouth daily.   [DISCONTINUED] ondansetron  (ZOFRAN -ODT) 4 MG disintegrating tablet Take 1 tablet (4 mg total) by mouth every 8 (eight) hours as needed for nausea or vomiting. (Patient not taking: Reported on 02/24/2024)   [DISCONTINUED] rosuvastatin  (CRESTOR ) 5 MG tablet Take 1 tablet (5 mg total) by mouth daily.   No facility-administered medications prior to visit.    ROS Negative unless specially indicated above in  HPI.    Objective:     BP 124/78   Pulse 78   Temp 97.6 F (36.4 C) (Temporal)   Ht 5' 10 (1.778 m)   Wt 151 lb 9.6 oz (68.8 kg)   SpO2 95%   BMI 21.75 kg/m    Physical Exam Vitals and nursing note reviewed.  Constitutional:      General: He is not in acute distress.    Appearance: Normal appearance. He is not ill-appearing, toxic-appearing or diaphoretic.  HENT:     Head: Normocephalic.     Right Ear: Tympanic membrane, ear canal and external ear normal.     Left Ear: Tympanic membrane, ear canal and external ear normal.     Nose: Nose normal.     Mouth/Throat:     Mouth: Mucous membranes are moist.     Pharynx: Oropharynx is clear.  Eyes:     Extraocular Movements: Extraocular movements intact.     Conjunctiva/sclera: Conjunctivae normal.     Pupils: Pupils are equal, round, and reactive to light.  Cardiovascular:     Rate and Rhythm: Normal rate and regular rhythm.     Pulses: Normal pulses.     Heart sounds: Normal heart sounds. No murmur heard.    No friction rub. No gallop.  Pulmonary:     Effort: Pulmonary  effort is normal.     Breath sounds: Normal breath sounds.  Abdominal:     General: Bowel sounds are normal. There is no distension.     Palpations: Abdomen is soft. There is no mass.     Tenderness: There is no abdominal tenderness. There is no guarding.  Musculoskeletal:     Cervical back: Normal range of motion and neck supple. No tenderness.     Right lower leg: No edema.     Left lower leg: No edema.  Skin:    General: Skin is warm and dry.     Capillary Refill: Capillary refill takes less than 2 seconds.     Findings: No lesion or rash.  Neurological:     General: No focal deficit present.     Mental Status: He is alert and oriented to person, place, and time.  Psychiatric:        Mood and Affect: Mood normal.        Behavior: Behavior normal.        Thought Content: Thought content normal.      No results found for any visits on  04/26/24.     Assessment & Plan:    Routine Health Maintenance and Physical Exam  Garrett Lin was seen today for annual exam.  Diagnoses and all orders for this visit:  Routine general medical examination at a health care facility  Hereditary hemochromatosis [facility]) Established with hematology for management. Iron levels have been at goal.  -     CBC with Differential/Platelet  Aortic atherosclerosis (HCC) Stopped statin due to myalgias. Fasting lipid panel pending.   Mixed hyperlipidemia -     Lipid panel  Screening for endocrine, metabolic and immunity disorder -     CMP14+EGFR -     TSH   Immunization History  Administered Date(s) Administered   Influenza Split 07/07/2013   Influenza-Unspecified 07/05/2015    Health Maintenance  Topic Date Due   DTaP/Tdap/Td (1 - Tdap) 10/05/2024 (Originally [birth year]26/1982)   COVID-19 Vaccine (1 - 2024-25 season) 10/21/2024 (Originally 06/05/2023)   Zoster Vaccines- Shingrix (1 of 2) 01/03/2025 (Originally 07/26/2013)   Pneumococcal Vaccine 42-60 Years old (1 of 2 - PCV) 04/26/2025 (Originally 07/26/1982)   INFLUENZA VACCINE  05/04/2024   Lung Cancer Screening  04/13/2025   Colonoscopy  08/12/2031   Hepatitis C Screening  Completed   HIV Screening  Completed   Hepatitis B Vaccines  Aged Out   HPV VACCINES  Aged Out   Meningococcal B Vaccine  Aged Out    Discussed health benefits of physical activity, and encouraged him to engage in regular exercise appropriate for his age and condition.  Problem List Items Addressed This Visit       Cardiovascular and Mediastinum   Aortic atherosclerosis (HCC)     Other   Hereditary hemochromatosis (HCC)   Relevant Orders   CBC with Differential/Platelet   Mixed hyperlipidemia   Relevant Orders   Lipid panel   Other Visit Diagnoses       Routine general medical examination at a health care facility    -  Primary     Screening for endocrine, metabolic and immunity disorder       Relevant  Orders   CMP14+EGFR   TSH      Return in about 1 year (around 04/26/2025) for CPE.   The patient indicates understanding of these issues and agrees with the plan.   Garrett CHRISTELLA Search, FNP

## 2024-04-27 ENCOUNTER — Ambulatory Visit: Payer: Self-pay | Admitting: Family Medicine

## 2024-04-27 DIAGNOSIS — E782 Mixed hyperlipidemia: Secondary | ICD-10-CM

## 2024-04-27 LAB — CBC WITH DIFFERENTIAL/PLATELET
Basophils Absolute: 0 x10E3/uL (ref 0.0–0.2)
Basos: 0 %
EOS (ABSOLUTE): 0.1 x10E3/uL (ref 0.0–0.4)
Eos: 2 %
Hematocrit: 48.7 % (ref 37.5–51.0)
Hemoglobin: 16.3 g/dL (ref 13.0–17.7)
Immature Grans (Abs): 0 x10E3/uL (ref 0.0–0.1)
Immature Granulocytes: 0 %
Lymphocytes Absolute: 1.7 x10E3/uL (ref 0.7–3.1)
Lymphs: 26 %
MCH: 31.8 pg (ref 26.6–33.0)
MCHC: 33.5 g/dL (ref 31.5–35.7)
MCV: 95 fL (ref 79–97)
Monocytes Absolute: 0.5 x10E3/uL (ref 0.1–0.9)
Monocytes: 7 %
Neutrophils Absolute: 4.3 x10E3/uL (ref 1.4–7.0)
Neutrophils: 65 %
Platelets: 333 x10E3/uL (ref 150–450)
RBC: 5.13 x10E6/uL (ref 4.14–5.80)
RDW: 11.8 % (ref 11.6–15.4)
WBC: 6.7 x10E3/uL (ref 3.4–10.8)

## 2024-04-27 LAB — CMP14+EGFR
ALT: 13 IU/L (ref 0–44)
AST: 14 IU/L (ref 0–40)
Albumin: 4.4 g/dL (ref 3.8–4.9)
Alkaline Phosphatase: 109 IU/L (ref 44–121)
BUN/Creatinine Ratio: 13 (ref 10–24)
BUN: 14 mg/dL (ref 8–27)
Bilirubin Total: 0.5 mg/dL (ref 0.0–1.2)
CO2: 22 mmol/L (ref 20–29)
Calcium: 9.4 mg/dL (ref 8.6–10.2)
Chloride: 103 mmol/L (ref 96–106)
Creatinine, Ser: 1.11 mg/dL (ref 0.76–1.27)
Globulin, Total: 2.6 g/dL (ref 1.5–4.5)
Glucose: 95 mg/dL (ref 70–99)
Potassium: 4.4 mmol/L (ref 3.5–5.2)
Sodium: 138 mmol/L (ref 134–144)
Total Protein: 7 g/dL (ref 6.0–8.5)
eGFR: 76 mL/min/1.73 (ref >59)

## 2024-04-27 LAB — LIPID PANEL
Chol/HDL Ratio: 3.7 ratio (ref 0.0–5.0)
Cholesterol, Total: 171 mg/dL (ref 100–199)
HDL: 46 mg/dL (ref >39)
LDL Chol Calc (NIH): 108 mg/dL — ABNORMAL HIGH (ref 0–99)
Triglycerides: 94 mg/dL (ref 0–149)
VLDL Cholesterol Cal: 17 mg/dL (ref 5–40)

## 2024-04-27 LAB — TSH: TSH: 1.83 u[IU]/mL (ref 0.450–4.500)

## 2024-04-27 MED ORDER — PRAVASTATIN SODIUM 40 MG PO TABS: 40.0000 mg | ORAL_TABLET | Freq: Every day | ORAL | 3 refills | Status: DC

## 2024-05-01 NOTE — Progress Notes (Signed)
 Patient notified of LDCT Lung Cancer Screening Results via mail with the recommendation to follow-up in 12 months. Patient's referring provider has been sent a copy of results. Results are as follows:  IMPRESSION: 1. Lung-RADS 2, benign appearance or behavior. Continue annual screening with low-dose chest CT without contrast in 12 months. 2.  Age advanced two vessel coronary artery calcification. 3.  Aortic atherosclerosis (ICD10-I70.0). 4.  Emphysema (ICD10-J43.9).

## 2024-05-18 ENCOUNTER — Inpatient Hospital Stay: Attending: Hematology

## 2024-05-18 DIAGNOSIS — Z87891 Personal history of nicotine dependence: Secondary | ICD-10-CM

## 2024-05-18 DIAGNOSIS — Z122 Encounter for screening for malignant neoplasm of respiratory organs: Secondary | ICD-10-CM

## 2024-05-18 LAB — CBC WITH DIFFERENTIAL/PLATELET
Abs Immature Granulocytes: 0.03 K/uL (ref 0.00–0.07)
Basophils Absolute: 0 K/uL (ref 0.0–0.1)
Basophils Relative: 0 %
Eosinophils Absolute: 0.1 K/uL (ref 0.0–0.5)
Eosinophils Relative: 1 %
HCT: 48.7 % (ref 39.0–52.0)
Hemoglobin: 16.9 g/dL (ref 13.0–17.0)
Immature Granulocytes: 0 %
Lymphocytes Relative: 23 %
Lymphs Abs: 2.1 K/uL (ref 0.7–4.0)
MCH: 32.1 pg (ref 26.0–34.0)
MCHC: 34.7 g/dL (ref 30.0–36.0)
MCV: 92.6 fL (ref 80.0–100.0)
Monocytes Absolute: 0.6 K/uL (ref 0.1–1.0)
Monocytes Relative: 7 %
Neutro Abs: 6.1 K/uL (ref 1.7–7.7)
Neutrophils Relative %: 69 %
Platelets: 309 K/uL (ref 150–400)
RBC: 5.26 MIL/uL (ref 4.22–5.81)
RDW: 11.4 % — ABNORMAL LOW (ref 11.5–15.5)
WBC: 8.9 K/uL (ref 4.0–10.5)
nRBC: 0 % (ref 0.0–0.2)

## 2024-05-18 LAB — COMPREHENSIVE METABOLIC PANEL WITH GFR
ALT: 14 U/L (ref 0–44)
AST: 16 U/L (ref 15–41)
Albumin: 3.7 g/dL (ref 3.5–5.0)
Alkaline Phosphatase: 85 U/L (ref 38–126)
Anion gap: 8 (ref 5–15)
BUN: 15 mg/dL (ref 6–20)
CO2: 24 mmol/L (ref 22–32)
Calcium: 9.2 mg/dL (ref 8.9–10.3)
Chloride: 104 mmol/L (ref 98–111)
Creatinine, Ser: 1.4 mg/dL — ABNORMAL HIGH (ref 0.61–1.24)
GFR, Estimated: 58 mL/min — ABNORMAL LOW (ref >60)
Glucose, Bld: 94 mg/dL (ref 70–99)
Potassium: 4.6 mmol/L (ref 3.5–5.1)
Sodium: 136 mmol/L (ref 135–145)
Total Bilirubin: 0.6 mg/dL (ref 0.0–1.2)
Total Protein: 7.2 g/dL (ref 6.5–8.1)

## 2024-05-18 LAB — IRON AND TIBC
Iron: 157 ug/dL (ref 45–182)
Saturation Ratios: 59 % — ABNORMAL HIGH (ref 17.9–39.5)
TIBC: 265 ug/dL (ref 250–450)
UIBC: 108 ug/dL

## 2024-05-18 LAB — FERRITIN: Ferritin: 57 ng/mL (ref 24–336)

## 2024-05-25 ENCOUNTER — Inpatient Hospital Stay

## 2024-05-25 ENCOUNTER — Inpatient Hospital Stay: Admitting: Oncology

## 2024-05-25 DIAGNOSIS — Z87891 Personal history of nicotine dependence: Secondary | ICD-10-CM

## 2024-05-25 DIAGNOSIS — Z122 Encounter for screening for malignant neoplasm of respiratory organs: Secondary | ICD-10-CM

## 2024-05-25 DIAGNOSIS — Z720 Tobacco use: Secondary | ICD-10-CM | POA: Diagnosis not present

## 2024-05-25 NOTE — Assessment & Plan Note (Addendum)
-   Lab from 05/18/2024 show iron saturations 59% with TIBC of 265.  Ferritin 57.  Hemoglobin is 16.9.  Platelet count is normal at 309.  Differential is unremarkable.  CMP shows mild elevation in his creatinine 1.40. -Plan is to keep ferritin below 100 with monthly phlebotomies of 250 mL given worsening fatigue following 500 ml phlebotomy and once he consistently is below 50 we will let him try outpatient blood donations and he will see us  every 6 months for labs. -She last received a phlebotomy on 04/20/2024. - 250 mL phlebotomy today for a ferritin of 57. -Return to clinic monthly for labs and phlebotomy and in 4 months for labs, phlebotomy and assessment.

## 2024-05-25 NOTE — Progress Notes (Signed)
 Garrett Lin presents today for phlebotomy per MD orders. Phlebotomy procedure started at 1144 and ended at 1151. 250 cc removed. Patient tolerated procedure well. IV needle removed intact. Pt refused any drinks or snacks and refused to wait the 30 minutes post phlebotomy observation. Pt had no complaints. VSS. All follow ups as scheduled.   Terressa Evola

## 2024-05-25 NOTE — Assessment & Plan Note (Addendum)
-  Most recent low-dose CT scan from 04/13/2024 lung RADS 2 benign appearance or behavior.  Continue annual screening.

## 2024-05-25 NOTE — Progress Notes (Signed)
 [facility] OFFICE PROGRESS NOTE  Joesph Annabella HERO, FNP  ASSESSMENT & PLAN:    Assessment & Plan Hereditary hemochromatosis (HCC) - Lab from 05/18/2024 show iron saturations 59% with TIBC of 265.  Ferritin 57.  Hemoglobin is 16.9.  Platelet count is normal at 309.  Differential is unremarkable.  CMP shows mild elevation in his creatinine 1.40. -Plan is to keep ferritin below 100 with monthly phlebotomies of 250 mL given worsening fatigue following 500 ml phlebotomy and once he consistently is below 50 we will let him try outpatient blood donations and he will see us  every 6 months for labs. -She last received a phlebotomy on 04/20/2024. - 250 mL phlebotomy today for a ferritin of 57. -Return to clinic monthly for labs and phlebotomy and in 4 months for labs, phlebotomy and assessment.    Tobacco abuse -Most recent low-dose CT scan from 04/13/2024 lung RADS 2 benign appearance or behavior.  Continue annual screening. Personal history of tobacco use, presenting hazards to health  Screening for malignant neoplasm of respiratory organ   Orders Placed This Encounter  Procedures   Iron and TIBC ([facility] ONLY)    Standing Status:   Future    Expected Date:   09/24/2024    Expiration Date:   12/23/2024   Ferritin    Standing Status:   Future    Expected Date:   09/24/2024    Expiration Date:   12/23/2024   Comprehensive metabolic panel    Standing Status:   Future    Expected Date:   09/24/2024    Expiration Date:   12/23/2024   CBC with Differential    Standing Status:   Future    Expected Date:   09/24/2024    Expiration Date:   12/23/2024    INTERVAL HISTORY: Patient returns for follow-up.  Patient recently had a low-dose CT scan.  Patient was evaluated on 01/13/2024 for abdominal pain nausea and vomiting and had a CT abdomen/pelvis which showed concern for infection or inflammation enteritis.  Reports overall he is feeling well.  His appetite  is 50% energy levels are 100%.  He denies any pain.  He has a cough secondary to smoking cigarettes.  He recently came off his cholesterol medicine and is now taking OTC red yeast rice and fish oil.  We reviewed iron panel, ferritin, CMP and CBC.  SUMMARY OF HEMATOLOGIC HISTORY: Oncology History   No history exists.    1.  Hereditary hemochromatosis (C282Y homozygous) - Severe hepatic iron deposition was incidental finding on recent MRI abdomen (06/11/2021), which was obtained by PCP for monitoring of previously noted hepatic cyst. - MRI abdomen (06/11/2021) showed severe diffuse hepatic iron deposition, as well as tiny < 5 mm cyst which correlates with lesion seen on prior CT; no evidence of hepatic neoplasm. - Hemochromatosis DNA testing shows homozygosity for C282Y HFE gene (07/23/2021).  No abnormalities on hepatic function panel.  Viral hepatitis panel was negative. - Cardiac MRI (09/16/2021) was negative for cardiac hemochromatosis. - At presentation, he had symptomatic iron overload with fatigue, joint pain, and abdominal pain after eating    - Discussed  course of untreated hereditary hemochromatosis with risk of cirrhosis, hepatocellular carcinoma, heart failure, and arrhythmia.  Iron overload also increases risk of certain bacterial infections. - We have discussed lifestyle modifications such as avoiding alcohol and iron supplementation.  Recent guidelines do not find that moderate red meat intake adversely affects patients with hemochromatosis.  Patient should  avoid raw fish or undercooked meat due to risk of certain bacterial infections to thrive and iron rich environments. - We have discussed phlebotomy protocol for treatment of hemochromatosis: Weekly phlebotomy as tolerated until goal ferritin is reached. If Hgb drops < 11.0, we will decrease frequency of phlebotomy. If patient experiences severe side effects or hemodynamic instability, we will decrease amount taken for  phlebotomy. Goal is ferritin < 50 to reverse hepatic iron deposition. Once iron stores have normalized, we will decrease frequency of phlebotomy. - Initially started on weekly 500 mL phlebotomy, decreased to weekly 250 mL phlebotomy due to significant fatigue after full phlebotomy.   2.  Tobacco abuse - Patient has been smoking 1.0-1.5 PPD since age 42. - This patient meets criteria for low-dose CT lung cancer screening (age 20-80 with a 20+ pack year history, current everyday smoker /OR/ quit < 15 years ago, no current signs or symptoms of lung cancer) - Shared decision making visit completed on 11/06/2021 - Low-dose CT chest (03/05/2022): Pulmonary nodules, lung RADS 2 (benign appearance or behavior); partially calcified bilateral pleural plaques indicative of asbestos-related pleural disease, aortic atherosclerosis, emphysema, coronary artery disease -Low-dose CT scan from 04/08/2023 shows lung RADS 2 benign appearance. CBC    Component Value Date/Time   WBC 8.9 05/18/2024 1049   RBC 5.26 05/18/2024 1049   HGB 16.9 05/18/2024 1049   HGB 16.3 04/26/2024 0818   HCT 48.7 05/18/2024 1049   HCT 48.7 04/26/2024 0818   PLT 309 05/18/2024 1049   PLT 333 04/26/2024 0818   MCV 92.6 05/18/2024 1049   MCV 95 04/26/2024 0818   MCH 32.1 05/18/2024 1049   MCHC 34.7 05/18/2024 1049   RDW 11.4 (L) 05/18/2024 1049   RDW 11.8 04/26/2024 0818   LYMPHSABS 2.1 05/18/2024 1049   LYMPHSABS 1.7 04/26/2024 0818   MONOABS 0.6 05/18/2024 1049   EOSABS 0.1 05/18/2024 1049   EOSABS 0.1 04/26/2024 0818   BASOSABS 0.0 05/18/2024 1049   BASOSABS 0.0 04/26/2024 0818       Latest Ref Rng & Units 05/18/2024   10:49 AM 04/26/2024    8:18 AM 01/26/2024    8:26 AM  CMP  Glucose 70 - 99 mg/dL 94  95  888   BUN 6 - 20 mg/dL 15  14  16    Creatinine 0.61 - 1.24 mg/dL 8.59  8.88  9.10   Sodium 135 - 145 mmol/L 136  138  134   Potassium 3.5 - 5.1 mmol/L 4.6  4.4  4.0   Chloride 98 - 111 mmol/L 104  103  106   CO2 22 -  32 mmol/L 24  22  22    Calcium  8.9 - 10.3 mg/dL 9.2  9.4  8.7   Total Protein 6.5 - 8.1 g/dL 7.2  7.0  6.8   Total Bilirubin 0.0 - 1.2 mg/dL 0.6  0.5  0.3   Alkaline Phos 38 - 126 U/L 85  109  73   AST 15 - 41 U/L 16  14  19    ALT 0 - 44 U/L 14  13  14       Lab Results  Component Value Date   FERRITIN 57 05/18/2024    Vitals:   05/25/24 1110 05/25/24 1115  BP: (!) 146/100 131/85  Pulse: 78   Resp: 16   Temp: 97.6 F (36.4 C)   SpO2: 100%     Review of System:  Review of Systems  Constitutional:  Negative for malaise/fatigue.  Respiratory:  Positive for cough.     Physical Exam: Physical Exam Constitutional:      Appearance: Normal appearance.  HENT:     Head: Normocephalic and atraumatic.  Eyes:     Pupils: Pupils are equal, round, and reactive to light.  Cardiovascular:     Rate and Rhythm: Normal rate and regular rhythm.     Heart sounds: Normal heart sounds. No murmur heard. Pulmonary:     Effort: Pulmonary effort is normal.     Breath sounds: Normal breath sounds. No wheezing.  Abdominal:     General: Bowel sounds are normal. There is no distension.     Palpations: Abdomen is soft.     Tenderness: There is no abdominal tenderness.  Musculoskeletal:        General: Normal range of motion.     Cervical back: Normal range of motion.  Skin:    General: Skin is warm and dry.     Findings: No rash.  Neurological:     Mental Status: He is alert and oriented to person, place, and time.     Gait: Gait is intact.  Psychiatric:        Mood and Affect: Mood and affect normal.        Cognition and Memory: Memory normal.        Judgment: Judgment normal.      I spent 25 minutes dedicated to the care of this patient (face-to-face and non-face-to-face) on the date of the encounter to include what is described in the assessment and plan.,  Delon Hope, NP 05/25/2024 11:38 AM

## 2024-05-25 NOTE — Patient Instructions (Signed)

## 2024-06-21 ENCOUNTER — Other Ambulatory Visit: Payer: Self-pay

## 2024-06-22 ENCOUNTER — Inpatient Hospital Stay

## 2024-06-22 ENCOUNTER — Inpatient Hospital Stay: Attending: Hematology

## 2024-06-22 LAB — CBC
HCT: 49.3 % (ref 39.0–52.0)
Hemoglobin: 17.1 g/dL — ABNORMAL HIGH (ref 13.0–17.0)
MCH: 31.5 pg (ref 26.0–34.0)
MCHC: 34.7 g/dL (ref 30.0–36.0)
MCV: 91 fL (ref 80.0–100.0)
Platelets: 307 K/uL (ref 150–400)
RBC: 5.42 MIL/uL (ref 4.22–5.81)
RDW: 11.2 % — ABNORMAL LOW (ref 11.5–15.5)
WBC: 8.8 K/uL (ref 4.0–10.5)
nRBC: 0 % (ref 0.0–0.2)

## 2024-06-22 NOTE — Progress Notes (Signed)
 Garrett Lin presents today for phlebotomy per MD orders. Phlebotomy procedure started at 1053 and ended at 1040. 250 grams removed. Patient observed for after procedure without any incident. Patient tolerated procedure well. IV needle removed intact.  Vitals stable and discharged home from clinic ambulatory. Follow up as scheduled.

## 2024-06-22 NOTE — Patient Instructions (Signed)
 [facility] [place] - A DEPT OF . [facility]  Discharge Instructions: Thank you for choosing [facility] to provide your oncology and hematology care.  If you have a lab appointment with the Cancer Center - please note that after April 8th, 2024, all labs will be drawn in the cancer center.  You do not have to check in or register with the main entrance as you have in the past but will complete your check-in in the cancer center.  Wear comfortable clothing and clothing appropriate for easy access to any Portacath or PICC line.   We strive to give you quality time with your provider. You may need to reschedule your appointment if you arrive late (15 or more minutes).  Arriving late affects you and other patients whose appointments are after yours.  Also, if you miss three or more appointments without notifying the office, you may be dismissed from the clinic at the provider's discretion.      For prescription refill requests, have your pharmacy contact our office and allow 72 hours for refills to be completed.    Today you received the following phlebotomy today   To help prevent nausea and vomiting after your treatment, we encourage you to take your nausea medication as directed.  BELOW ARE SYMPTOMS THAT SHOULD BE REPORTED IMMEDIATELY: *FEVER GREATER THAN 100.4 F (38 C) OR HIGHER *CHILLS OR SWEATING *NAUSEA AND VOMITING THAT IS NOT CONTROLLED WITH YOUR NAUSEA MEDICATION *UNUSUAL SHORTNESS OF BREATH *UNUSUAL BRUISING OR BLEEDING *URINARY PROBLEMS (pain or burning when urinating, or frequent urination) *BOWEL PROBLEMS (unusual diarrhea, constipation, pain near the anus) TENDERNESS IN MOUTH AND THROAT WITH OR WITHOUT PRESENCE OF ULCERS (sore throat, sores in mouth, or a toothache) UNUSUAL RASH, SWELLING OR PAIN  UNUSUAL VAGINAL DISCHARGE OR ITCHING   Items with * indicate a potential emergency and should be followed up as soon as possible or go to the  Emergency Department if any problems should occur.  Please show the CHEMOTHERAPY ALERT CARD or IMMUNOTHERAPY ALERT CARD at check-in to the Emergency Department and triage nurse.  Should you have questions after your visit or need to cancel or reschedule your appointment, please contact [facility] [place] - A DEPT OF [facility] [phone]  and follow the prompts.  Office hours are 8:00 a.m. to 4:30 p.m. Monday - Friday. Please note that voicemails left after 4:00 p.m. may not be returned until the following business day.  We are closed weekends and major holidays. You have access to a nurse at all times for urgent questions. Please call the main number to the clinic [phone] and follow the prompts.  For any non-urgent questions, you may also contact your provider using MyChart. We now offer e-Visits for anyone 45 and older to request care online for non-urgent symptoms. For details visit [email or web address].   Also download the MyChart app! Go to the app store, search MyChart, open the app, select [place], and log in with your MyChart username and password.

## 2024-07-19 ENCOUNTER — Other Ambulatory Visit: Payer: Self-pay

## 2024-07-20 ENCOUNTER — Inpatient Hospital Stay

## 2024-07-20 ENCOUNTER — Inpatient Hospital Stay: Attending: Hematology

## 2024-07-20 LAB — CBC WITH DIFFERENTIAL/PLATELET
Abs Immature Granulocytes: 0.02 K/uL (ref 0.00–0.07)
Basophils Absolute: 0.1 K/uL (ref 0.0–0.1)
Basophils Relative: 1 %
Eosinophils Absolute: 0.1 K/uL (ref 0.0–0.5)
Eosinophils Relative: 2 %
HCT: 50.5 % (ref 39.0–52.0)
Hemoglobin: 17.7 g/dL — ABNORMAL HIGH (ref 13.0–17.0)
Immature Granulocytes: 0 %
Lymphocytes Relative: 27 %
Lymphs Abs: 2.4 K/uL (ref 0.7–4.0)
MCH: 31.7 pg (ref 26.0–34.0)
MCHC: 35 g/dL (ref 30.0–36.0)
MCV: 90.5 fL (ref 80.0–100.0)
Monocytes Absolute: 0.7 K/uL (ref 0.1–1.0)
Monocytes Relative: 8 %
Neutro Abs: 5.5 K/uL (ref 1.7–7.7)
Neutrophils Relative %: 62 %
Platelets: 324 K/uL (ref 150–400)
RBC: 5.58 MIL/uL (ref 4.22–5.81)
RDW: 11.3 % — ABNORMAL LOW (ref 11.5–15.5)
WBC: 8.8 K/uL (ref 4.0–10.5)
nRBC: 0 % (ref 0.0–0.2)

## 2024-07-20 LAB — FERRITIN: Ferritin: 94 ng/mL (ref 24–336)

## 2024-07-20 NOTE — Progress Notes (Signed)
 Per pt he has been feeling really sluggish and tired for the last couple of days- weeks. NP notified. Per NP to add ferritin lab today and patient may leave if he does not want to wait for ferritin results. Pt updated and agrees to have ferritin drawn today and if ferritin is greater than 50 then we will schedule his phlebotomy for next week. RN will contact patient about ferritin level once resulted.   RN contacted patient about ferritin at 94. Pt scheduled for phlebotomy on 07/27/2024 at 0930. Pt updated and agrees to new scheduled.   Kasaundra Fahrney

## 2024-07-27 ENCOUNTER — Inpatient Hospital Stay

## 2024-07-27 NOTE — Patient Instructions (Signed)
 [facility] [place] - A DEPT OF [facility]  Discharge Instructions: Thank you for choosing [facility] to provide your oncology and hematology care.  If you have a lab appointment with the Cancer Center - please note that after April 8th, 2024, all labs will be drawn in the cancer center.  You do not have to check in or register with the main entrance as you have in the past but will complete your check-in in the cancer center.  Wear comfortable clothing and clothing appropriate for easy access to any Portacath or PICC line.   We strive to give you quality time with your provider. You may need to reschedule your appointment if you arrive late (15 or more minutes).  Arriving late affects you and other patients whose appointments are after yours.  Also, if you miss three or more appointments without notifying the office, you may be dismissed from the clinic at the provider's discretion.      For prescription refill requests, have your pharmacy contact our office and allow 72 hours for refills to be completed.    Today you received the following chemotherapy and/or immunotherapy agents Phlebotomy      To help prevent nausea and vomiting after your treatment, we encourage you to take your nausea medication as directed.  BELOW ARE SYMPTOMS THAT SHOULD BE REPORTED IMMEDIATELY: *FEVER GREATER THAN 100.4 F (38 C) OR HIGHER *CHILLS OR SWEATING *NAUSEA AND VOMITING THAT IS NOT CONTROLLED WITH YOUR NAUSEA MEDICATION *UNUSUAL SHORTNESS OF BREATH *UNUSUAL BRUISING OR BLEEDING *URINARY PROBLEMS (pain or burning when urinating, or frequent urination) *BOWEL PROBLEMS (unusual diarrhea, constipation, pain near the anus) TENDERNESS IN MOUTH AND THROAT WITH OR WITHOUT PRESENCE OF ULCERS (sore throat, sores in mouth, or a toothache) UNUSUAL RASH, SWELLING OR PAIN  UNUSUAL VAGINAL DISCHARGE OR ITCHING   Items with * indicate a potential emergency and should be followed  up as soon as possible or go to the Emergency Department if any problems should occur.  Please show the CHEMOTHERAPY ALERT CARD or IMMUNOTHERAPY ALERT CARD at check-in to the Emergency Department and triage nurse.  Should you have questions after your visit or need to cancel or reschedule your appointment, please contact [facility] [place] - A DEPT OF Eligh[facility] Br[facility] [phone]  and follow the prompts.  Office hours are 8:00 a.m. to 4:30 p.m. Monday - Friday. Please note that voicemails left after 4:00 p.m. may not be returned until the following business day.  We are closed weekends and major holidays. You have access to a nurse at all times for urgent questions. Please call the main number to the clinic [phone] and follow the prompts.  For any non-urgent questions, you may also contact your provider using MyChart. We now offer e-Visits for anyone 11 and older to request care online for non-urgent symptoms. For details visit [email or web address].   Also download the MyChart app! Go to the app store, search "MyChart", open the app, select [place], and log in with your MyChart username and password.

## 2024-07-27 NOTE — Progress Notes (Signed)
 Patient presents today for phlebotomy via port a cath per MD orders. Phlebotomy procedure started at 0935  and ended at 0940. 250 cc removed. Patient tolerated procedure well. Procedure tolerated well and without incident. Discharged ambulatory in stable condition.

## 2024-08-02 ENCOUNTER — Ambulatory Visit: Admitting: Family Medicine

## 2024-08-09 ENCOUNTER — Encounter: Payer: Self-pay | Admitting: Family Medicine

## 2024-08-09 ENCOUNTER — Ambulatory Visit: Admitting: Family Medicine

## 2024-08-09 VITALS — BP 120/69 | HR 73 | Temp 98.0°F | Ht 70.0 in | Wt 159.0 lb

## 2024-08-09 DIAGNOSIS — E782 Mixed hyperlipidemia: Secondary | ICD-10-CM

## 2024-08-09 DIAGNOSIS — I700 Atherosclerosis of aorta: Secondary | ICD-10-CM | POA: Diagnosis not present

## 2024-08-09 NOTE — Progress Notes (Signed)
   Established Patient Office Visit  Subjective   Patient ID: Garrett Lin, male    DOB: 06/05/1961  Age: 63 y.o. MRN: [id number]  Chief Complaint  Patient presents with   Medical Management of Chronic Issues    HPI  History of Present Illness   Garrett Lin is a 63 year old male with hyperlipidemia who presents for a cholesterol recheck.  Hyperlipidemia management - Here for recheck of lipid panel however he is not fasting today. He had a large glass of chocolate milk - Fasting status was maintained during previous cholesterol check - Pravastatin  was prescribed but he chose not to take this - Currently manages cholesterol with fish oil and red yeast rice, taking two pills of each twice daily (morning and night) - No adverse effects experienced with current regimen - Intolerance to certain medications in the past        ROS As per HPI.    Objective:     BP 120/69   Pulse 73   Temp 98 F (36.7 C) (Temporal)   Ht 5' 10 (1.778 m)   Wt 159 lb (72.1 kg)   SpO2 98%   BMI 22.81 kg/m    Physical Exam Vitals and nursing note reviewed.  Constitutional:      General: He is not in acute distress.    Appearance: He is not ill-appearing, toxic-appearing or diaphoretic.  Cardiovascular:     Rate and Rhythm: Normal rate and regular rhythm.     Pulses: Normal pulses.     Heart sounds: Normal heart sounds.  Pulmonary:     Effort: Pulmonary effort is normal. No respiratory distress.     Breath sounds: Normal breath sounds.  Skin:    General: Skin is warm and dry.  Neurological:     General: No focal deficit present.     Mental Status: He is alert and oriented to person, place, and time.  Psychiatric:        Mood and Affect: Mood normal.        Behavior: Behavior normal.      No results found for any visits on 08/09/24.    The 10-year ASCVD risk score (Arnett DK, et al., 2019) is: 12%    Assessment & Plan:   Garrett Lin was seen today for medical management of  chronic issues.  Diagnoses and all orders for this visit:  Mixed hyperlipidemia -     Lipid panel; Future  Aortic atherosclerosis   Assessment and Plan    Mixed hyperlipidemia Managed with fish oil and red yeast rice.  - Schedule fasting lipid panel for tomorrow. - Continue fish oil and red yeast rice. Declined pravastatin  - Will determine follow up pending lab results       Garrett CHRISTELLA Search, FNP

## 2024-08-10 ENCOUNTER — Other Ambulatory Visit

## 2024-08-10 DIAGNOSIS — E782 Mixed hyperlipidemia: Secondary | ICD-10-CM

## 2024-08-11 LAB — LIPID PANEL
Chol/HDL Ratio: 3.5 ratio (ref 0.0–5.0)
Cholesterol, Total: 169 mg/dL (ref 100–199)
HDL: 48 mg/dL (ref >39)
LDL Chol Calc (NIH): 102 mg/dL — ABNORMAL HIGH (ref 0–99)
Triglycerides: 103 mg/dL (ref 0–149)
VLDL Cholesterol Cal: 19 mg/dL (ref 5–40)

## 2024-08-13 ENCOUNTER — Ambulatory Visit: Payer: Self-pay | Admitting: Family Medicine

## 2024-08-20 ENCOUNTER — Encounter: Payer: Self-pay | Admitting: Nurse Practitioner

## 2024-08-20 ENCOUNTER — Ambulatory Visit (INDEPENDENT_AMBULATORY_CARE_PROVIDER_SITE_OTHER): Admitting: Nurse Practitioner

## 2024-08-20 VITALS — BP 127/87 | HR 84 | Temp 97.0°F | Ht 70.0 in | Wt 158.2 lb

## 2024-08-20 DIAGNOSIS — R051 Acute cough: Secondary | ICD-10-CM | POA: Diagnosis not present

## 2024-08-20 DIAGNOSIS — J0110 Acute frontal sinusitis, unspecified: Secondary | ICD-10-CM | POA: Insufficient documentation

## 2024-08-20 MED ORDER — GUAIFENESIN 400 MG PO TABS: 400.0000 mg | ORAL_TABLET | ORAL | 0 refills | Status: AC

## 2024-08-20 MED ORDER — AZITHROMYCIN 250 MG PO TABS: ORAL_TABLET | ORAL | 0 refills | Status: AC

## 2024-08-20 NOTE — Progress Notes (Signed)
 Subjective:  Patient ID: Garrett Lin, male    DOB: November 23, 1960, 63 y.o.   MRN: [id number]  Patient Care Team: Joesph Annabella HERO, FNP as PCP - General (Family Medicine) Cindie Carlin POUR, DO as Consulting Physician (Gastroenterology)   Chief Complaint:  Sore Throat (Symptoms started Friday ) and Cough   HPI: Garrett Lin is a 63 y.o. male presenting on 08/20/2024 for Sore Throat (Symptoms started Friday ) and Cough   Discussed the use of AI scribe software for clinical note transcription with the patient, who gave verbal consent to proceed.  History of Present Illness Garrett Lin is a 63 year old male who presents with symptoms of a cold and sinus congestion.  Symptoms began on Friday after exposure to someone who coughed near him without covering his mouth. He experiences nasal congestion, sore throat, jaw pain, and a persistent cough. He describes a 'bitter taste' in his mouth and notes that his nose remains 'stopped up'.  He has been gargling with Listerine, which caused a burning sensation, and has been taking Tylenol  for jaw and teeth pain. No green mucus production when coughing or blowing his nose. He experienced fever on Friday and Saturday. He also reports dizziness when bending over and standing up too quickly.  He works at [employer] and notes that his symptoms worsened throughout the day when he attempted to work. He took a personal day today and a vacation day yesterday due to the severity of his symptoms.  He smokes about a pack of cigarettes a day and has been doing so for a long time. He reports drinking water and trying to maintain his appetite despite not feeling hungry.      Relevant past medical, surgical, family, and social history reviewed and updated as indicated.  Allergies and medications reviewed and updated. Data reviewed: Chart in Epic.   Past Medical History:  Diagnosis Date   Chronic pain    right forearm.   Hereditary  hemochromatosis 08/07/2021   Irritable bowel syndrome (IBS) 2006, 2007   nervous stomach abdominal pain   Small bowel obstruction due to adhesions (HCC) 09/02/2013    Past Surgical History:  Procedure Laterality Date   COLONOSCOPY  2006   Lynwood Bohr MD   COLONOSCOPY WITH PROPOFOL  N/A 08/11/2021   Procedure: COLONOSCOPY WITH PROPOFOL ;  Surgeon: Cindie Carlin POUR, DO;  Location: [facility];  Service: Endoscopy;  Laterality: N/A;  9:00 / ASA II   ESOPHAGOGASTRODUODENOSCOPY  2006   Lynwood Headings MD   LAPAROSCOPIC LYSIS OF ADHESIONS N/A 09/02/2013   Procedure: LAPAROSCOPIC LYSIS OF ADHESIONS ;  Surgeon: Elon HERO Pacini, MD;  Location: [facility] OR;  Service: General;  Laterality: N/A;   SHOULDER ARTHROSCOPY Right 04/2014   Dr. Kay   surgery for right forarm trauma     work related machine injury    Social History   Socioeconomic History   Marital status: Married    Spouse name: Not on file   Number of children: Not on file   Years of education: Not on file   Highest education level: Not on file  Occupational History   Not on file  Tobacco Use   Smoking status: Every Day    Current packs/day: 1.00    Average packs/day: 1 pack/day for 38.0 years (38.0 ttl pk-yrs)    Types: Cigarettes   Smokeless tobacco: Never  Vaping Use   Vaping status: Never Used  Substance and Sexual Activity  Alcohol use: No   Drug use: No   Sexual activity: Not on file  Other Topics Concern   Not on file  Social History Narrative   Not on file   Social Drivers of Health   Financial Resource Strain: Not on file  Food Insecurity: Not on file  Transportation Needs: Not on file  Physical Activity: Not on file  Stress: Not on file  Social Connections: Not on file  Intimate Partner Violence: Not on file    Outpatient Encounter Medications as of 08/20/2024  Medication Sig   azithromycin  (ZITHROMAX ) 250 MG tablet Take 2 tablets on day 1, then 1 tablet daily on days 2 through 5   guaifenesin  (HUMIBID E) 400 MG TABS tablet Take 1 tablet (400 mg total) by mouth every 4 (four) hours.   Omega-3 Fatty Acids (FISH OIL BURP-LESS PO) Take by mouth.   Red Yeast Rice Extract (RED YEAST RICE PO) Take by mouth.   No facility-administered encounter medications on file as of 08/20/2024.    No Known Allergies  Pertinent ROS per HPI, otherwise unremarkable      Objective:  BP 127/87   Pulse 84   Temp (!) 97 F (36.1 C) (Temporal)   Ht 5' 10 (1.778 m)   Wt 158 lb 3.2 oz (71.8 kg)   SpO2 98%   BMI 22.70 kg/m    Wt Readings from Last 3 Encounters:  08/20/24 158 lb 3.2 oz (71.8 kg)  08/09/24 159 lb (72.1 kg)  05/25/24 156 lb 8.4 oz (71 kg)    Physical Exam Vitals and nursing note reviewed.  HENT:     Head: Normocephalic and atraumatic.     Right Ear: Hearing, tympanic membrane, ear canal and external ear normal.     Left Ear: Hearing, tympanic membrane, ear canal and external ear normal.     Nose:     Right Turbinates: Swollen.     Left Turbinates: Swollen.     Right Sinus: Frontal sinus tenderness present.     Left Sinus: Frontal sinus tenderness present.     Mouth/Throat:     Lips: Pink.     Mouth: Mucous membranes are moist.     Pharynx: Oropharynx is clear. Postnasal drip present.  Eyes:     General: No scleral icterus.    Extraocular Movements: Extraocular movements intact.     Conjunctiva/sclera: Conjunctivae normal.     Pupils: Pupils are equal, round, and reactive to light.  Cardiovascular:     Heart sounds: Normal heart sounds.  Pulmonary:     Effort: Pulmonary effort is normal.     Breath sounds: Normal breath sounds.  Musculoskeletal:        General: Normal range of motion.     Right lower leg: No edema.     Left lower leg: No edema.  Skin:    General: Skin is warm and dry.     Findings: No rash.  Neurological:     Mental Status: He is alert and oriented to person, place, and time.  Psychiatric:        Mood and Affect: Mood normal.         Behavior: Behavior normal.        Thought Content: Thought content normal.        Judgment: Judgment normal.    Physical Exam CHEST: Lungs clear to auscultation bilaterally.     Results for orders placed or performed in visit on 08/10/24  Lipid panel  Collection Time: 08/10/24  8:50 AM  Result Value Ref Range   Cholesterol, Total 169 100 - 199 mg/dL   Triglycerides 896 0 - 149 mg/dL   HDL 48 >60 mg/dL   VLDL Cholesterol Cal 19 5 - 40 mg/dL   LDL Chol Calc (NIH) 897 (H) 0 - 99 mg/dL   Chol/HDL Ratio 3.5 0.0 - 5.0 ratio       Pertinent labs & imaging results that were available during my care of the patient were reviewed by me and considered in my medical decision making.  Assessment & Plan:  Garrett Lin was seen today for sore throat and cough.  Diagnoses and all orders for this visit:  Acute non-recurrent frontal sinusitis -     azithromycin  (ZITHROMAX ) 250 MG tablet; Take 2 tablets on day 1, then 1 tablet daily on days 2 through 5  Acute cough -     guaifenesin (HUMIBID E) 400 MG TABS tablet; Take 1 tablet (400 mg total) by mouth every 4 (four) hours.     Assessment and Plan Garrett Lin a 63 year old Caucasian male seen today for sinusitis, no acute distress Assessment & Plan Acute frontal sinusitis Symptoms include nasal congestion, jaw pain, and head pressure. No current fever or purulent discharge. - Prescribed azithromycin  (Z-Pak): two tablets today, then one daily for four days. - Advised to complete antibiotics course. - Recommended salt water gargles for throat discomfort. - Advised hydration and rest until Thursday.  Acute cough Dry cough, worsens with standing and temperature changes. No current fever. - Prescribed guaifenesin up to three times daily with water. - Advised acetaminophen  or ibuprofen  for fever as needed.  Tobacco use Chronic smoking, one pack per day. - Work note provided return on Thursday     Continue all other maintenance  medications.  Follow up plan: Return if symptoms worsen or fail to improve.   Continue healthy lifestyle choices, including diet (rich in fruits, vegetables, and lean proteins, and low in salt and simple carbohydrates) and exercise (at least 30 minutes of moderate physical activity daily).  Educational handout given for    Clinical References  Cough, Adult A cough helps to clear your throat and lungs. It may be a sign of an illness or another condition. A short-term (acute) cough may last 2-3 weeks. A long-term (chronic) cough may last 8 or more weeks. Many things can cause a cough. They include: Illnesses such as: An infection in your throat or lungs. Asthma or other heart or lung problems. Gastroesophageal reflux. This is when acid comes back up from your stomach. Breathing in things that bother (irritate) your lungs. Allergies. Postnasal drip. This is when mucus runs down the back of your throat. Smoking. Some medicines. Follow these instructions at home: Medicines Take over-the-counter and prescription medicines only as told by your doctor. Talk with your doctor before you take cough medicine (cough suppressants). Eating and drinking Do not drink alcohol. Do not drink caffeine. Drink enough fluid to keep your pee (urine) pale yellow. Lifestyle Stay away from cigarette smoke. Do not smoke or use any products that contain nicotine or tobacco. If you need help quitting, ask your doctor. Stay away from things that make you cough. These may include perfume, candles, cleaning products, or campfire smoke. General instructions  Watch for any changes to your cough. Tell your doctor about them. Always cover your mouth when you cough. If the air is dry in your home, use a cool mist vaporizer or humidifier. If your cough is  worse at night, try using extra pillows to raise your head up higher while you sleep. Rest as needed. Contact a doctor if: You have new symptoms. Your  symptoms get worse. You cough up pus. You have a fever that does not go away. Your cough does not get better after 2-3 weeks. Cough medicine does not help, and you are not sleeping well. You have pain that gets worse or is not helped with medicine. You are losing weight and do not know why. You have night sweats. Get help right away if: You cough up blood. You have trouble breathing. Your heart is beating very fast. These symptoms may be an emergency. Get help right away. Call 911. Do not wait to see if the symptoms will go away. Do not drive yourself to the hospital. This information is not intended to replace advice given to you by your health care provider. Make sure you discuss any questions you have with your health care provider. Document Revised: 05/21/2022 Document Reviewed: 05/21/2022 Elsevier Patient Education  2024 Elsevier Inc. How to Perform a Sinus Rinse A sinus rinse is a home treatment. It rinses your sinuses with a mixture of salt and water (saline solution). Sinuses are air-filled spaces in your skull behind the bones of your face and forehead. They open into your nasal cavity. A sinus rinse can help to clear your nasal cavity. It can clear mucus, dirt, dust, or pollen. You may do a sinus rinse when you have: A cold. A virus. Allergies. A sinus infection. A stuffy nose. What are the risks? A sinus rinse is normally very safe and helpful. However, there are a few risks. These include: A burning feeling in the sinuses. This may happen if you do not make the saline solution as told. Follow all directions. Nasal irritation. Infection from unclean water. This is rare, but it can happen. Do not do a sinus rinse if you have had: Ear or nasal surgery. An ear infection. Plugged ears. Supplies needed: Saline solution or powder. Distilled or germ-free (sterile) water may be needed to mix with saline powder. You may use boiled and cooled tap water. Boil tap water for 5  minutes. Cool the water until it is lukewarm. Use within 24 hours. Do not use regular tap water to mix with the saline solution. Neti pot or nasal rinse bottle. These release the saline solution into your nose and through your sinuses. You can buy neti pots and rinse bottles: At your local pharmacy. At a health food store. Online. How to do a sinus rinse  Wash your hands with soap and water for at least 20 seconds. If you cannot use soap and water, use hand sanitizer. Wash your device using the directions that came with it. Dry your device. Use the solution that comes with your device or one that is sold separately in stores. Follow the mixing directions on the package if you need to mix with germ-free or distilled water. Fill your device with the amount of saline solution stated in the device instructions. Stand by a sink and tilt your head sideways over the sink. Place the spout of the device in your upper nostril (the one closer to the ceiling). Gently pour or squeeze the saline solution into your nasal cavity. The liquid should drain to your lower nostril if you are not too stuffed up (congested). While rinsing, breathe through your open mouth. Gently blow your nose to clear any mucus and rinse solution. Blowing too hard may  cause ear pain. Turn your head in the other direction and repeat in your other nostril. Clean and rinse your device with clean water. Air-dry your device. Talk with your doctor or pharmacist if you have questions about how to do a sinus rinse. Summary A sinus rinse is a home treatment. It rinses your sinuses with a mixture of salt and water (saline solution). A sinus rinse can clear mucus, dirt, dust, or pollen. A sinus rinse is normally very safe and helpful. Follow all instructions carefully. This information is not intended to replace advice given to you by your health care provider. Make sure you discuss any questions you have with your health care  provider. Document Revised: 03/09/2021 Document Reviewed: 03/09/2021 Elsevier Patient Education  2024 Elsevier Inc. Sinus Infection, Adult A sinus infection is soreness and swelling (inflammation) of your sinuses. Sinuses are hollow spaces in the bones around your face. They are located: Around your eyes. In the middle of your forehead. Behind your nose. In your cheekbones. Your sinuses and nasal passages are lined with a fluid called mucus. Mucus drains out of your sinuses. Swelling can trap mucus in your sinuses. This lets germs (bacteria, virus, or fungus) grow, which leads to infection. Most of the time, this condition is caused by a virus. What are the causes? Allergies. Asthma. Germs. Things that block your nose or sinuses. Growths in the nose (nasal polyps). Chemicals or irritants in the air. A fungus. This is rare. What increases the risk? Having a weak body defense system (immune system). Doing a lot of swimming or diving. Using nasal sprays too much. Smoking. What are the signs or symptoms? The main symptoms of this condition are pain and a feeling of pressure around the sinuses. Other symptoms include: Stuffy nose (congestion). This may make it hard to breathe through your nose. Runny nose (drainage). Soreness, swelling, and warmth in the sinuses. A cough that may get worse at night. Being unable to smell and taste. Mucus that collects in the throat or the back of the nose (postnasal drip). This may cause a sore throat or bad breath. Being very tired (fatigued). A fever. How is this diagnosed? Your symptoms. Your medical history. A physical exam. Tests to find out if your condition is short-term (acute) or long-term (chronic). Your doctor may: Check your nose for growths (polyps). Check your sinuses using a tool that has a light on one end (endoscope). Check for allergies or germs. Do imaging tests, such as an MRI or CT scan. How is this treated? Treatment for  this condition depends on the cause and whether it is short-term or long-term. If caused by a virus, your symptoms should go away on their own within 10 days. You may be given medicines to relieve symptoms. They include: Medicines that shrink swollen tissue in the nose. A spray that treats swelling of the nostrils. Rinses that help get rid of thick mucus in your nose (nasal saline washes). Medicines that treat allergies (antihistamines). Over-the-counter pain relievers. If caused by bacteria, your doctor may wait to see if you will get better without treatment. You may be given antibiotic medicine if you have: A very bad infection. A weak body defense system. If caused by growths in the nose, surgery may be needed. Follow these instructions at home: Medicines Take, use, or apply over-the-counter and prescription medicines only as told by your doctor. These may include nasal sprays. If you were prescribed an antibiotic medicine, take it as told by your doctor.  Do not stop taking it even if you start to feel better. Hydrate and humidify  Drink enough water to keep your pee (urine) pale yellow. Use a cool mist humidifier to keep the humidity level in your home above 50%. Breathe in steam for 10-15 minutes, 3-4 times a day, or as told by your doctor. You can do this in the bathroom while a hot shower is running. Try not to spend time in cool or dry air. Rest Rest as much as you can. Sleep with your head raised (elevated). Make sure you get enough sleep each night. General instructions  Put a warm, moist washcloth on your face 3-4 times a day, or as often as told by your doctor. Use nasal saline washes as often as told by your doctor. Wash your hands often with soap and water. If you cannot use soap and water, use hand sanitizer. Do not smoke. Avoid being around people who are smoking (secondhand smoke). Keep all follow-up visits. Contact a doctor if: You have a fever. Your symptoms get  worse. Your symptoms do not get better within 10 days. Get help right away if: You have a very bad headache. You cannot stop vomiting. You have very bad pain or swelling around your face or eyes. You have trouble seeing. You feel confused. Your neck is stiff. You have trouble breathing. These symptoms may be an emergency. Get help right away. Call 911. Do not wait to see if the symptoms will go away. Do not drive yourself to the hospital. Summary A sinus infection is swelling of your sinuses. Sinuses are hollow spaces in the bones around your face. This condition is caused by tissues in your nose that become inflamed or swollen. This traps germs. These can lead to infection. If you were prescribed an antibiotic medicine, take it as told by your doctor. Do not stop taking it even if you start to feel better. Keep all follow-up visits. This information is not intended to replace advice given to you by your health care provider. Make sure you discuss any questions you have with your health care provider. Document Revised: 08/25/2021 Document Reviewed: 08/25/2021 Elsevier Patient Education  2024 Elsevier Inc.  The above assessment and management plan was discussed with the patient. The patient verbalized understanding of and has agreed to the management plan. Patient is aware to call the clinic if they develop any new symptoms or if symptoms persist or worsen. Patient is aware when to return to the clinic for a follow-up visit. Patient educated on when it is appropriate to go to the emergency department.   Shanvi Moyd [place] Thompson, DNP [facility] [address] [place], [place] [address] [phone]

## 2024-08-24 ENCOUNTER — Inpatient Hospital Stay: Attending: Hematology | Admitting: Oncology

## 2024-08-24 ENCOUNTER — Ambulatory Visit: Payer: Self-pay | Admitting: Oncology

## 2024-08-24 ENCOUNTER — Inpatient Hospital Stay

## 2024-08-24 DIAGNOSIS — Z720 Tobacco use: Secondary | ICD-10-CM

## 2024-08-24 DIAGNOSIS — Z803 Family history of malignant neoplasm of breast: Secondary | ICD-10-CM | POA: Diagnosis not present

## 2024-08-24 DIAGNOSIS — F17210 Nicotine dependence, cigarettes, uncomplicated: Secondary | ICD-10-CM | POA: Diagnosis not present

## 2024-08-24 LAB — FERRITIN: Ferritin: 249 ng/mL (ref 24–336)

## 2024-08-24 LAB — IRON AND TIBC
Iron: 118 ug/dL (ref 45–182)
Saturation Ratios: 39 % (ref 17.9–39.5)
TIBC: 302 ug/dL (ref 250–450)
UIBC: 184 ug/dL

## 2024-08-24 LAB — COMPREHENSIVE METABOLIC PANEL WITH GFR
ALT: 15 U/L (ref 0–44)
AST: 20 U/L (ref 15–41)
Albumin: 4.6 g/dL (ref 3.5–5.0)
Alkaline Phosphatase: 99 U/L (ref 38–126)
Anion gap: 8 (ref 5–15)
BUN: 13 mg/dL (ref 8–23)
CO2: 28 mmol/L (ref 22–32)
Calcium: 9.5 mg/dL (ref 8.9–10.3)
Chloride: 102 mmol/L (ref 98–111)
Creatinine, Ser: 1 mg/dL (ref 0.61–1.24)
GFR, Estimated: >60 mL/min (ref >60)
Glucose, Bld: 89 mg/dL (ref 70–99)
Potassium: 4.7 mmol/L (ref 3.5–5.1)
Sodium: 138 mmol/L (ref 135–145)
Total Bilirubin: 0.5 mg/dL (ref 0.0–1.2)
Total Protein: 7.9 g/dL (ref 6.5–8.1)

## 2024-08-24 LAB — CBC WITH DIFFERENTIAL/PLATELET
Abs Immature Granulocytes: 0.03 K/uL (ref 0.00–0.07)
Basophils Absolute: 0 K/uL (ref 0.0–0.1)
Basophils Relative: 1 %
Eosinophils Absolute: 0.1 K/uL (ref 0.0–0.5)
Eosinophils Relative: 1 %
HCT: 50 % (ref 39.0–52.0)
Hemoglobin: 17.6 g/dL — ABNORMAL HIGH (ref 13.0–17.0)
Immature Granulocytes: 0 %
Lymphocytes Relative: 29 %
Lymphs Abs: 2.3 K/uL (ref 0.7–4.0)
MCH: 31.8 pg (ref 26.0–34.0)
MCHC: 35.2 g/dL (ref 30.0–36.0)
MCV: 90.4 fL (ref 80.0–100.0)
Monocytes Absolute: 0.6 K/uL (ref 0.1–1.0)
Monocytes Relative: 8 %
Neutro Abs: 4.8 K/uL (ref 1.7–7.7)
Neutrophils Relative %: 61 %
Platelets: 318 K/uL (ref 150–400)
RBC: 5.53 MIL/uL (ref 4.22–5.81)
RDW: 11.4 % — ABNORMAL LOW (ref 11.5–15.5)
WBC: 7.8 K/uL (ref 4.0–10.5)
nRBC: 0 % (ref 0.0–0.2)

## 2024-08-24 NOTE — Progress Notes (Signed)
 We let patient know that his ferritin is elevated likely secondary to his current infection.  Tell him not to stress about that but to get scheduled for a phlebotomy at some point.  Iron saturations have improved from previous so I think the ferritin is a false reading.

## 2024-08-24 NOTE — Assessment & Plan Note (Addendum)
-   Lab from 08/24/2024 show iron saturations 39% with TIBC of 265.  Ferritin 249.  Hemoglobin is 17.6.  Platelet count is normal at 318.  Differential is unremarkable.  CMP shows mild elevation in his creatinine 1.40. -Ferritin likely elevated secondary to current infection. -Plan is to keep ferritin below 100 with monthly phlebotomies of 250 mL given worsening fatigue following 500 ml phlebotomy and once he consistently is below 50 we will let him try outpatient blood donations and he will see us  every 6 months for labs. -He last received a phlebotomy on 07/27/24. -Patient would like to hold off on phlebotomy today given he is not feeling well.  He will call back to reschedule this. -Return to clinic monthly for labs and phlebotomy and in 4 months for labs, phlebotomy and assessment.

## 2024-08-24 NOTE — Progress Notes (Signed)
 [facility] OFFICE PROGRESS NOTE  Joesph Annabella HERO, FNP  ASSESSMENT & PLAN:  Assessment & Plan Hereditary hemochromatosis - Lab from 08/24/2024 show iron saturations 39% with TIBC of 265.  Ferritin 249.  Hemoglobin is 17.6.  Platelet count is normal at 318.  Differential is unremarkable.  CMP shows mild elevation in his creatinine 1.40. -Ferritin likely elevated secondary to current infection. -Plan is to keep ferritin below 100 with monthly phlebotomies of 250 mL given worsening fatigue following 500 ml phlebotomy and once he consistently is below 50 we will let him try outpatient blood donations and he will see us  every 6 months for labs. -He last received a phlebotomy on 07/27/24. -Patient would like to hold off on phlebotomy today given he is not feeling well.  He will call back to reschedule this. -Return to clinic monthly for labs and phlebotomy and in 4 months for labs, phlebotomy and assessment.    Tobacco abuse -Most recent low-dose CT scan from 04/13/2024 lung RADS 2 benign appearance or behavior.  Continue annual screening.  Orders Placed This Encounter  Procedures   Iron and TIBC ([facility] ONLY)    Standing Status:   Future    Expected Date:   12/22/2024    Expiration Date:   03/22/2025   Ferritin    Standing Status:   Future    Expected Date:   12/22/2024    Expiration Date:   03/22/2025   Comprehensive metabolic panel    Standing Status:   Future    Expected Date:   12/22/2024    Expiration Date:   03/22/2025   CBC with Differential    Standing Status:   Future    Expected Date:   12/22/2024    Expiration Date:   03/22/2025   CBC with Differential    Standing Status:   Standing    Number of Occurrences:   3    Expiration Date:   08/24/2025   Ferritin    Standing Status:   Standing    Number of Occurrences:   3    Expiration Date:   08/24/2025    INTERVAL HISTORY: Patient returns for follow-up for hereditary  hemochromatosis.  He continues to receive monthly low-volume phlebotomies (250 mL) due to poor tolerance of 500 mL.  He denies any hospitalizations, surgeries or changes to his baseline health.  Overall is feeling well.  Needs to have low energy and appetite levels.  No pain.  Patient is a current everyday smoker and is involved in the low-dose CT screening program.  Last scan was in July.  This will be repeated in July 2026.   He has a cold today and is currently on a z-pak and something for cough. Reports he is feeling some better but is still really tired.  Does not feel to having a phlebotomy today.  We reviewed iron panel, ferritin, CMP and CBC.  SUMMARY OF HEMATOLOGIC HISTORY: Oncology History   No history exists.    1.  Hereditary hemochromatosis (C282Y homozygous) - Severe hepatic iron deposition was incidental finding on recent MRI abdomen (06/11/2021), which was obtained by PCP for monitoring of previously noted hepatic cyst. - MRI abdomen (06/11/2021) showed severe diffuse hepatic iron deposition, as well as tiny < 5 mm cyst which correlates with lesion seen on prior CT; no evidence of hepatic neoplasm. - Hemochromatosis DNA testing shows homozygosity for C282Y HFE gene (07/23/2021).  No abnormalities on hepatic function panel.  Viral hepatitis panel  was negative. - Cardiac MRI (09/16/2021) was negative for cardiac hemochromatosis. - At presentation, he had symptomatic iron overload with fatigue, joint pain, and abdominal pain after eating    - Discussed  course of untreated hereditary hemochromatosis with risk of cirrhosis, hepatocellular carcinoma, heart failure, and arrhythmia.  Iron overload also increases risk of certain bacterial infections. - We have discussed lifestyle modifications such as avoiding alcohol and iron supplementation.  Recent guidelines do not find that moderate red meat intake adversely affects patients with hemochromatosis.  Patient should avoid raw fish or  undercooked meat due to risk of certain bacterial infections to thrive and iron rich environments. - We have discussed phlebotomy protocol for treatment of hemochromatosis: Weekly phlebotomy as tolerated until goal ferritin is reached. If Hgb drops < 11.0, we will decrease frequency of phlebotomy. If patient experiences severe side effects or hemodynamic instability, we will decrease amount taken for phlebotomy. Goal is ferritin < 50 to reverse hepatic iron deposition. Once iron stores have normalized, we will decrease frequency of phlebotomy. - Initially started on weekly 500 mL phlebotomy, decreased to weekly 250 mL phlebotomy due to significant fatigue after full phlebotomy.   2.  Tobacco abuse - Patient has been smoking 1.0-1.5 PPD since age 67. - This patient meets criteria for low-dose CT lung cancer screening (age 11-80 with a 20+ pack year history, current everyday smoker /OR/ quit < 15 years ago, no current signs or symptoms of lung cancer) - Shared decision making visit completed on 11/06/2021 - Low-dose CT chest (03/05/2022): Pulmonary nodules, lung RADS 2 (benign appearance or behavior); partially calcified bilateral pleural plaques indicative of asbestos-related pleural disease, aortic atherosclerosis, emphysema, coronary artery disease -Low-dose CT scan from 04/08/2023 shows lung RADS 2 benign appearance. CBC    Component Value Date/Time   WBC 7.8 08/24/2024 0821   RBC 5.53 08/24/2024 0821   HGB 17.6 (H) 08/24/2024 0821   HGB 16.3 04/26/2024 0818   HCT 50.0 08/24/2024 0821   HCT 48.7 04/26/2024 0818   PLT 318 08/24/2024 0821   PLT 333 04/26/2024 0818   MCV 90.4 08/24/2024 0821   MCV 95 04/26/2024 0818   MCH 31.8 08/24/2024 0821   MCHC 35.2 08/24/2024 0821   RDW 11.4 (L) 08/24/2024 0821   RDW 11.8 04/26/2024 0818   LYMPHSABS 2.3 08/24/2024 0821   LYMPHSABS 1.7 04/26/2024 0818   MONOABS 0.6 08/24/2024 0821   EOSABS 0.1 08/24/2024 0821   EOSABS 0.1 04/26/2024 0818   BASOSABS  0.0 08/24/2024 0821   BASOSABS 0.0 04/26/2024 0818       Latest Ref Rng & Units 08/24/2024    8:21 AM 05/18/2024   10:49 AM 04/26/2024    8:18 AM  CMP  Glucose 70 - 99 mg/dL 89  94  95   BUN 8 - 23 mg/dL 13  15  14    Creatinine 0.61 - 1.24 mg/dL 8.99  8.59  8.88   Sodium 135 - 145 mmol/L 138  136  138   Potassium 3.5 - 5.1 mmol/L 4.7  4.6  4.4   Chloride 98 - 111 mmol/L 102  104  103   CO2 22 - 32 mmol/L 28  24  22    Calcium  8.9 - 10.3 mg/dL 9.5  9.2  9.4   Total Protein 6.5 - 8.1 g/dL 7.9  7.2  7.0   Total Bilirubin 0.0 - 1.2 mg/dL 0.5  0.6  0.5   Alkaline Phos 38 - 126 U/L 99  85  109  AST 15 - 41 U/L 20  16  14    ALT 0 - 44 U/L 15  14  13       Lab Results  Component Value Date   FERRITIN 249 08/24/2024    Vitals:   08/24/24 0835 08/24/24 0840  BP: (!) 121/94 120/79  Pulse: 80   Resp: 16   Temp: (!) 97.5 F (36.4 C)   SpO2: 100%     Review of System:  Review of Systems  Constitutional:  Negative for malaise/fatigue.  Respiratory:  Positive for cough.     Physical Exam: Physical Exam Constitutional:      Appearance: Normal appearance.  HENT:     Head: Normocephalic and atraumatic.  Eyes:     Pupils: Pupils are equal, round, and reactive to light.  Cardiovascular:     Rate and Rhythm: Normal rate and regular rhythm.     Heart sounds: Normal heart sounds. No murmur heard. Pulmonary:     Effort: Pulmonary effort is normal.     Breath sounds: Normal breath sounds. No wheezing.  Abdominal:     General: Bowel sounds are normal. There is no distension.     Palpations: Abdomen is soft.     Tenderness: There is no abdominal tenderness.  Musculoskeletal:        General: Normal range of motion.     Cervical back: Normal range of motion.  Skin:    General: Skin is warm and dry.     Findings: No rash.  Neurological:     Mental Status: He is alert and oriented to person, place, and time.     Gait: Gait is intact.  Psychiatric:        Mood and Affect: Mood  and affect normal.        Cognition and Memory: Memory normal.        Judgment: Judgment normal.      I spent 25 minutes dedicated to the care of this patient (face-to-face and non-face-to-face) on the date of the encounter to include what is described in the assessment and plan.,  Delon Hope, NP 08/24/2024 9:04 AM

## 2024-08-24 NOTE — Assessment & Plan Note (Addendum)
-  Most recent low-dose CT scan from 04/13/2024 lung RADS 2 benign appearance or behavior.  Continue annual screening.

## 2024-08-24 NOTE — Progress Notes (Signed)
 Patient declined phlebotomy today.

## 2024-08-27 NOTE — Progress Notes (Signed)
 Patient made aware and verbalized understanding.  Will schedule for phlebotomy in 2 weeks.

## 2024-09-06 ENCOUNTER — Inpatient Hospital Stay: Attending: Hematology

## 2024-09-06 LAB — CBC WITH DIFFERENTIAL/PLATELET
Abs Immature Granulocytes: 0.02 K/uL (ref 0.00–0.07)
Basophils Absolute: 0 K/uL (ref 0.0–0.1)
Basophils Relative: 0 %
Eosinophils Absolute: 0.1 K/uL (ref 0.0–0.5)
Eosinophils Relative: 1 %
HCT: 48.2 % (ref 39.0–52.0)
Hemoglobin: 16.7 g/dL (ref 13.0–17.0)
Immature Granulocytes: 0 %
Lymphocytes Relative: 24 %
Lymphs Abs: 2.2 K/uL (ref 0.7–4.0)
MCH: 31.7 pg (ref 26.0–34.0)
MCHC: 34.6 g/dL (ref 30.0–36.0)
MCV: 91.5 fL (ref 80.0–100.0)
Monocytes Absolute: 0.6 K/uL (ref 0.1–1.0)
Monocytes Relative: 6 %
Neutro Abs: 6.2 K/uL (ref 1.7–7.7)
Neutrophils Relative %: 69 %
Platelets: 333 K/uL (ref 150–400)
RBC: 5.27 MIL/uL (ref 4.22–5.81)
RDW: 11.5 % (ref 11.5–15.5)
WBC: 9.1 K/uL (ref 4.0–10.5)
nRBC: 0 % (ref 0.0–0.2)

## 2024-09-06 LAB — FERRITIN: Ferritin: 117 ng/mL (ref 24–336)

## 2024-09-06 NOTE — Progress Notes (Signed)
 Garrett Lin presents today for phlebotomy per MD orders, labs reviewed.  Phlebotomy procedure started at 1400 and ended at 1408. 250 cc removed. Pt declined snacks, drink, and to wait the observation period.  Patient tolerated procedure well with no complications. All follow ups as scheduled.   Charan Prieto

## 2024-09-07 ENCOUNTER — Inpatient Hospital Stay

## 2024-09-21 ENCOUNTER — Inpatient Hospital Stay

## 2024-10-01 ENCOUNTER — Encounter: Payer: Self-pay | Admitting: *Deleted

## 2024-10-05 ENCOUNTER — Encounter (HOSPITAL_COMMUNITY): Payer: Self-pay | Admitting: Oncology

## 2024-10-12 ENCOUNTER — Inpatient Hospital Stay: Payer: Self-pay

## 2024-10-12 ENCOUNTER — Inpatient Hospital Stay: Payer: Self-pay | Attending: Hematology

## 2024-10-12 ENCOUNTER — Encounter (HOSPITAL_COMMUNITY): Payer: Self-pay | Admitting: Oncology

## 2024-10-12 LAB — CBC WITH DIFFERENTIAL/PLATELET
Abs Immature Granulocytes: 0.02 K/uL (ref 0.00–0.07)
Basophils Absolute: 0.1 K/uL (ref 0.0–0.1)
Basophils Relative: 1 %
Eosinophils Absolute: 0.1 K/uL (ref 0.0–0.5)
Eosinophils Relative: 1 %
HCT: 48.2 % (ref 39.0–52.0)
Hemoglobin: 16.8 g/dL (ref 13.0–17.0)
Immature Granulocytes: 0 %
Lymphocytes Relative: 32 %
Lymphs Abs: 2.6 K/uL (ref 0.7–4.0)
MCH: 31.9 pg (ref 26.0–34.0)
MCHC: 34.9 g/dL (ref 30.0–36.0)
MCV: 91.6 fL (ref 80.0–100.0)
Monocytes Absolute: 0.5 K/uL (ref 0.1–1.0)
Monocytes Relative: 6 %
Neutro Abs: 4.9 K/uL (ref 1.7–7.7)
Neutrophils Relative %: 60 %
Platelets: 308 K/uL (ref 150–400)
RBC: 5.26 MIL/uL (ref 4.22–5.81)
RDW: 11.5 % (ref 11.5–15.5)
WBC: 8.2 K/uL (ref 4.0–10.5)
nRBC: 0 % (ref 0.0–0.2)

## 2024-10-12 LAB — FERRITIN: Ferritin: 100 ng/mL (ref 24–336)

## 2024-10-12 NOTE — Patient Instructions (Signed)
 [facility] [place] - A DEPT OF [facility]  Discharge Instructions: Thank you for choosing [facility] to provide your oncology and hematology care.  If you have a lab appointment with the Cancer Center - please note that after April 8th, 2024, all labs will be drawn in the cancer center.  You do not have to check in or register with the main entrance as you have in the past but will complete your check-in in the cancer center.  Wear comfortable clothing and clothing appropriate for easy access to any Portacath or PICC line.   We strive to give you quality time with your provider. You may need to reschedule your appointment if you arrive late (15 or more minutes).  Arriving late affects you and other patients whose appointments are after yours.  Also, if you miss three or more appointments without notifying the office, you may be dismissed from the clinic at the provider's discretion.      For prescription refill requests, have your pharmacy contact our office and allow 72 hours for refills to be completed.    Today you received the following Phlebotomy return as scheduled.   To help prevent nausea and vomiting after your treatment, we encourage you to take your nausea medication as directed.  BELOW ARE SYMPTOMS THAT SHOULD BE REPORTED IMMEDIATELY: *FEVER GREATER THAN 100.4 F (38 C) OR HIGHER *CHILLS OR SWEATING *NAUSEA AND VOMITING THAT IS NOT CONTROLLED WITH YOUR NAUSEA MEDICATION *UNUSUAL SHORTNESS OF BREATH *UNUSUAL BRUISING OR BLEEDING *URINARY PROBLEMS (pain or burning when urinating, or frequent urination) *BOWEL PROBLEMS (unusual diarrhea, constipation, pain near the anus) TENDERNESS IN MOUTH AND THROAT WITH OR WITHOUT PRESENCE OF ULCERS (sore throat, sores in mouth, or a toothache) UNUSUAL RASH, SWELLING OR PAIN  UNUSUAL VAGINAL DISCHARGE OR ITCHING   Items with * indicate a potential emergency and should be followed up as soon as possible  or go to the Emergency Department if any problems should occur.  Please show the CHEMOTHERAPY ALERT CARD or IMMUNOTHERAPY ALERT CARD at check-in to the Emergency Department and triage nurse.  Should you have questions after your visit or need to cancel or reschedule your appointment, please contact [facility] [place] - A DEPT OF [facility] [phone]  and follow the prompts.  Office hours are 8:00 a.m. to 4:30 p.m. Monday - Friday. Please note that voicemails left after 4:00 p.m. may not be returned until the following business day.  We are closed weekends and major holidays. You have access to a nurse at all times for urgent questions. Please call the main number to the clinic [phone] and follow the prompts.  For any non-urgent questions, you may also contact your provider using MyChart. We now offer e-Visits for anyone 78 and older to request care online for non-urgent symptoms. For details visit [email or web address].   Also download the MyChart app! Go to the app store, search MyChart, open the app, select [place], and log in with your MyChart username and password.

## 2024-10-12 NOTE — Progress Notes (Signed)
 Garrett Lin presents today for phlebotomy per MD orders. Phlebotomy procedure started at 1308 and ended at 1310. 250 cc removed. Patient tolerated procedure well. IV needle removed intact.

## 2024-10-18 ENCOUNTER — Other Ambulatory Visit: Payer: Self-pay

## 2024-10-18 DIAGNOSIS — Z87891 Personal history of nicotine dependence: Secondary | ICD-10-CM

## 2024-10-18 DIAGNOSIS — F17210 Nicotine dependence, cigarettes, uncomplicated: Secondary | ICD-10-CM

## 2024-10-18 DIAGNOSIS — Z122 Encounter for screening for malignant neoplasm of respiratory organs: Secondary | ICD-10-CM

## 2024-10-26 ENCOUNTER — Inpatient Hospital Stay: Payer: Self-pay

## 2024-11-09 ENCOUNTER — Inpatient Hospital Stay: Payer: Self-pay

## 2024-11-09 ENCOUNTER — Inpatient Hospital Stay: Payer: Self-pay | Attending: Hematology

## 2024-11-09 LAB — CBC WITH DIFFERENTIAL/PLATELET
Abs Immature Granulocytes: 0.02 10*3/uL (ref 0.00–0.07)
Basophils Absolute: 0 10*3/uL (ref 0.0–0.1)
Basophils Relative: 0 %
Eosinophils Absolute: 0.2 10*3/uL (ref 0.0–0.5)
Eosinophils Relative: 2 %
HCT: 49 % (ref 39.0–52.0)
Hemoglobin: 17.1 g/dL — ABNORMAL HIGH (ref 13.0–17.0)
Immature Granulocytes: 0 %
Lymphocytes Relative: 32 %
Lymphs Abs: 2.5 10*3/uL (ref 0.7–4.0)
MCH: 32.3 pg (ref 26.0–34.0)
MCHC: 34.9 g/dL (ref 30.0–36.0)
MCV: 92.6 fL (ref 80.0–100.0)
Monocytes Absolute: 0.6 10*3/uL (ref 0.1–1.0)
Monocytes Relative: 8 %
Neutro Abs: 4.5 10*3/uL (ref 1.7–7.7)
Neutrophils Relative %: 58 %
Platelets: 311 10*3/uL (ref 150–400)
RBC: 5.29 MIL/uL (ref 4.22–5.81)
RDW: 11.9 % (ref 11.5–15.5)
WBC: 7.8 10*3/uL (ref 4.0–10.5)
nRBC: 0 % (ref 0.0–0.2)

## 2024-11-09 LAB — FERRITIN: Ferritin: 227 ng/mL (ref 24–336)

## 2024-11-09 NOTE — Progress Notes (Signed)
 Garrett Lin presents for therapeutic phlebotomy per MD orders. Last HGB 17.1 / HCT 49.0 on 11-09-2024 . Vital signs stable prior to procedure. Procedure started at 09:54 am and ended at 09:57 am. 250 mls of blood removed. Patient denies any dizziness , lightheadedness, or feeling faint.  Gauze and coban applied to site. Vital signs stable at completion of procedure. Patient has no complaints at this time. Alert and oriented x 3. Discharged in stable condition.

## 2024-11-09 NOTE — Patient Instructions (Signed)
 [facility] [place] - A DEPT OF [facility]  Discharge Instructions: Thank you for choosing [facility] to provide your oncology and hematology care.  If you have a lab appointment with the Cancer Center - please note that after April 8th, 2024, all labs will be drawn in the cancer center.  You do not have to check in or register with the main entrance as you have in the past but will complete your check-in in the cancer center.  Wear comfortable clothing and clothing appropriate for easy access to any Portacath or PICC line.   We strive to give you quality time with your provider. You may need to reschedule your appointment if you arrive late (15 or more minutes).  Arriving late affects you and other patients whose appointments are after yours.  Also, if you miss three or more appointments without notifying the office, you may be dismissed from the clinic at the provider's discretion.      For prescription refill requests, have your pharmacy contact our office and allow 72 hours for refills to be completed.    Today you received the following chemotherapy and/or immunotherapy agents phlebotomy. Therapeutic Phlebotomy Discharge Instructions  - Increase your fluid intake over the next 4 hours  - No smoking for 30 minutes  - Avoid using the affected arm (the one you had the blood drawn from) for heavy lifting or other activities.  - You may resume all normal activities after 30 minutes.  You are to notify the office if you experience:   - Persistent dizziness and/or lightheadedness - Uncontrolled or excessive bleeding at the site.        To help prevent nausea and vomiting after your treatment, we encourage you to take your nausea medication as directed.  BELOW ARE SYMPTOMS THAT SHOULD BE REPORTED IMMEDIATELY: *FEVER GREATER THAN 100.4 F (38 C) OR HIGHER *CHILLS OR SWEATING *NAUSEA AND VOMITING THAT IS NOT CONTROLLED WITH YOUR NAUSEA  MEDICATION *UNUSUAL SHORTNESS OF BREATH *UNUSUAL BRUISING OR BLEEDING *URINARY PROBLEMS (pain or burning when urinating, or frequent urination) *BOWEL PROBLEMS (unusual diarrhea, constipation, pain near the anus) TENDERNESS IN MOUTH AND THROAT WITH OR WITHOUT PRESENCE OF ULCERS (sore throat, sores in mouth, or a toothache) UNUSUAL RASH, SWELLING OR PAIN  UNUSUAL VAGINAL DISCHARGE OR ITCHING   Items with * indicate a potential emergency and should be followed up as soon as possible or go to the Emergency Department if any problems should occur.  Please show the CHEMOTHERAPY ALERT CARD or IMMUNOTHERAPY ALERT CARD at check-in to the Emergency Department and triage nurse.  Should you have questions after your visit or need to cancel or reschedule your appointment, please contact [facility] [place] - A DEPT OF [facility] [phone]  and follow the prompts.  Office hours are 8:00 a.m. to 4:30 p.m. Monday - Friday. Please note that voicemails left after 4:00 p.m. may not be returned until the following business day.  We are closed weekends and major holidays. You have access to a nurse at all times for urgent questions. Please call the main number to the clinic [phone] and follow the prompts.  For any non-urgent questions, you may also contact your provider using MyChart. We now offer e-Visits for anyone 31 and older to request care online for non-urgent symptoms. For details visit [email or web address].   Also download the MyChart app! Go to the app store, search "MyChart", open the app,  select [place], and log in with your MyChart username and password.

## 2024-11-15 ENCOUNTER — Ambulatory Visit: Admitting: Family Medicine

## 2024-11-23 ENCOUNTER — Inpatient Hospital Stay: Payer: Self-pay

## 2024-12-07 ENCOUNTER — Inpatient Hospital Stay: Payer: Self-pay | Attending: Hematology

## 2024-12-07 ENCOUNTER — Inpatient Hospital Stay: Payer: Self-pay

## 2024-12-28 ENCOUNTER — Inpatient Hospital Stay: Payer: Self-pay | Admitting: Oncology

## 2024-12-28 ENCOUNTER — Inpatient Hospital Stay: Payer: Self-pay

## 2025-01-04 ENCOUNTER — Inpatient Hospital Stay: Payer: Self-pay | Attending: Hematology

## 2025-01-04 ENCOUNTER — Inpatient Hospital Stay: Payer: Self-pay | Admitting: Oncology

## 2025-01-04 ENCOUNTER — Inpatient Hospital Stay: Payer: Self-pay

## 2025-04-29 ENCOUNTER — Encounter: Payer: Self-pay | Admitting: Family Medicine
